# Patient Record
Sex: Female | Born: 1945 | Race: White | Hispanic: No | Marital: Married | Smoking: Former smoker
Health system: Southern US, Community
[De-identification: ages and names within clinical notes are randomized; demographics above are authoritative.]

## PROBLEM LIST (undated history)

## (undated) DIAGNOSIS — T8859XA Other complications of anesthesia, initial encounter: Secondary | ICD-10-CM

## (undated) DIAGNOSIS — M1990 Unspecified osteoarthritis, unspecified site: Secondary | ICD-10-CM

## (undated) DIAGNOSIS — M797 Fibromyalgia: Secondary | ICD-10-CM

## (undated) DIAGNOSIS — J181 Lobar pneumonia, unspecified organism: Secondary | ICD-10-CM

## (undated) DIAGNOSIS — I10 Essential (primary) hypertension: Secondary | ICD-10-CM

## (undated) DIAGNOSIS — I341 Nonrheumatic mitral (valve) prolapse: Secondary | ICD-10-CM

## (undated) DIAGNOSIS — Z85850 Personal history of malignant neoplasm of thyroid: Secondary | ICD-10-CM

## (undated) DIAGNOSIS — E039 Hypothyroidism, unspecified: Secondary | ICD-10-CM

## (undated) DIAGNOSIS — C3490 Malignant neoplasm of unspecified part of unspecified bronchus or lung: Secondary | ICD-10-CM

## (undated) DIAGNOSIS — F419 Anxiety disorder, unspecified: Secondary | ICD-10-CM

## (undated) DIAGNOSIS — E079 Disorder of thyroid, unspecified: Secondary | ICD-10-CM

## (undated) DIAGNOSIS — C801 Malignant (primary) neoplasm, unspecified: Secondary | ICD-10-CM

## (undated) DIAGNOSIS — Z9882 Breast implant status: Secondary | ICD-10-CM

## (undated) DIAGNOSIS — Z5112 Encounter for antineoplastic immunotherapy: Secondary | ICD-10-CM

## (undated) DIAGNOSIS — R0602 Shortness of breath: Secondary | ICD-10-CM

## (undated) DIAGNOSIS — T4145XA Adverse effect of unspecified anesthetic, initial encounter: Secondary | ICD-10-CM

## (undated) DIAGNOSIS — R51 Headache: Secondary | ICD-10-CM

## (undated) HISTORY — PX: CERVICAL CONE BIOPSY: SUR198

## (undated) HISTORY — DX: Breast implant status: Z98.82

## (undated) HISTORY — PX: PARS PLANA VITRECTOMY W/ REPAIR OF MACULAR HOLE: SHX2170

## (undated) HISTORY — DX: Lobar pneumonia, unspecified organism: J18.1

## (undated) HISTORY — PX: BREAST ENHANCEMENT SURGERY: SHX7

## (undated) HISTORY — DX: Disorder of thyroid, unspecified: E07.9

## (undated) HISTORY — PX: CATARACT EXTRACTION: SUR2

## (undated) HISTORY — DX: Personal history of malignant neoplasm of thyroid: Z85.850

## (undated) HISTORY — DX: Encounter for antineoplastic immunotherapy: Z51.12

## (undated) HISTORY — DX: Essential (primary) hypertension: I10

## (undated) HISTORY — PX: THYROIDECTOMY: SHX17

## (undated) HISTORY — PX: EYE SURGERY: SHX253

## (undated) HISTORY — PX: RETINAL DETACHMENT SURGERY: SHX105

## (undated) HISTORY — PX: TONSILLECTOMY: SUR1361

## (undated) HISTORY — DX: Headache: R51

## (undated) HISTORY — PX: BACK SURGERY: SHX140

---

## 2002-05-02 ENCOUNTER — Ambulatory Visit (HOSPITAL_COMMUNITY): Admission: RE | Admit: 2002-05-02 | Discharge: 2002-05-02 | Payer: Self-pay | Admitting: Internal Medicine

## 2002-05-02 ENCOUNTER — Encounter: Payer: Self-pay | Admitting: Internal Medicine

## 2003-03-15 ENCOUNTER — Encounter: Payer: Self-pay | Admitting: Ophthalmology

## 2003-03-15 ENCOUNTER — Ambulatory Visit (HOSPITAL_COMMUNITY): Admission: RE | Admit: 2003-03-15 | Discharge: 2003-03-16 | Payer: Self-pay | Admitting: Ophthalmology

## 2003-03-23 ENCOUNTER — Ambulatory Visit (HOSPITAL_COMMUNITY): Admission: RE | Admit: 2003-03-23 | Discharge: 2003-03-24 | Payer: Self-pay | Admitting: Ophthalmology

## 2003-12-27 ENCOUNTER — Encounter (HOSPITAL_COMMUNITY): Admission: RE | Admit: 2003-12-27 | Discharge: 2004-01-01 | Payer: Self-pay | Admitting: Internal Medicine

## 2004-01-10 ENCOUNTER — Ambulatory Visit (HOSPITAL_COMMUNITY): Admission: RE | Admit: 2004-01-10 | Discharge: 2004-01-11 | Payer: Self-pay | Admitting: Neurosurgery

## 2004-09-02 ENCOUNTER — Encounter (HOSPITAL_COMMUNITY): Admission: RE | Admit: 2004-09-02 | Discharge: 2004-09-03 | Payer: Self-pay | Admitting: Internal Medicine

## 2005-02-12 ENCOUNTER — Ambulatory Visit (HOSPITAL_COMMUNITY): Admission: RE | Admit: 2005-02-12 | Discharge: 2005-02-12 | Payer: Self-pay | Admitting: Internal Medicine

## 2005-05-08 ENCOUNTER — Ambulatory Visit (HOSPITAL_COMMUNITY): Admission: RE | Admit: 2005-05-08 | Discharge: 2005-05-08 | Payer: Self-pay | Admitting: Internal Medicine

## 2007-01-13 ENCOUNTER — Encounter: Admission: RE | Admit: 2007-01-13 | Discharge: 2007-01-13 | Payer: Self-pay | Admitting: Neurosurgery

## 2007-10-25 ENCOUNTER — Ambulatory Visit (HOSPITAL_COMMUNITY): Admission: RE | Admit: 2007-10-25 | Discharge: 2007-10-25 | Payer: Self-pay | Admitting: Internal Medicine

## 2008-07-24 ENCOUNTER — Ambulatory Visit (HOSPITAL_COMMUNITY): Admission: RE | Admit: 2008-07-24 | Discharge: 2008-07-25 | Payer: Self-pay | Admitting: Neurosurgery

## 2008-11-18 ENCOUNTER — Encounter: Admission: RE | Admit: 2008-11-18 | Discharge: 2008-11-18 | Payer: Self-pay | Admitting: Neurosurgery

## 2010-12-10 NOTE — Op Note (Signed)
NAME:  [name], [name]                  ACCOUNT NO.:  [id number]   MEDICAL RECORD NO.:  [id number]          PATIENT TYPE:  OIB   LOCATION:  3005                         FACILITY:  [facility]   PHYSICIAN:  Cristi Loron, M.D.DATE OF BIRTH:  Jan 16, 1946   DATE OF PROCEDURE:  07/24/2008  DATE OF DISCHARGE:                               OPERATIVE REPORT   BRIEF HISTORY:  The patient is a 65 year old white female who has  suffered from back and right leg pain consistent with a right L4 and L5  radiculopathy.  She failed extensive nonsurgical management and was  worked up with lumbar MRI, which demonstrated the patient had a synovial  cyst and severe facet arthropathy at L4-5 on the right.  I discussed the  various treatment options with the patient including surgery.  The  patient has weighed the risks, benefits, and alternatives of surgery and  decided to proceed with a right L4 hemilaminectomy to remove synovial  cyst and decompress the right L4 as well as L5 nerve root.   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   PROCEDURE:  Right L4 redo hemilaminectomy to decompress the right L4 as  well as L5 nerve root using microdissection and removal of right L4-5  synovial cyst.   SURGEON:  Cristi Loron, MD   ASSISTANT:  Coletta Memos, MD   ANESTHESIA:  General endotracheal.   ESTIMATED BLOOD LOSS:  100 mL.   SPECIMENS:  None.   DRAINS:  None.   COMPLICATIONS:  Unintentional durotomy.   DESCRIPTION OF PROCEDURE:  The patient was brought to the operating room  by the anesthesia team.  General endotracheal anesthesia was induced.  The patient was turned to the prone position on the Wilson frame.  Her  lumbosacral region was  then prepared with Betadine scrub and Betadine  solution.  Sterile drapes were applied.  I then injected the area to be  incised with Marcaine with epinephrine solution.  I used a scalpel to  make an incision through the patient's previous surgical scar.  I used  electrocautery to perform a right-sided subperiosteal dissection  exposing the spinous processes and lamina of right L4 and L5.  We  obtained intraoperative radiograph to confirm our location.  We brought  intraoperative microscope into the field, and under image magnification  and illumination, we completed the microdissection/decompression.  As we  drilled to perform a left L4 laminotomy, there was a quite a bit of scar  tissue at L4-5 because of the patient's prior surgery. We encountered  quite a bit of scar tissue from L4-5 because of the patient's prior  surgery.  We carefully drilled the caudal edge of the right L4 lamina  until we encountered a relatively nonscarred down dura.  We used  microdissection to free up the dura from the epidural fibrosis and  removed the scar tissue using Kerrison punches.  We  performed a  foraminotomy about the left L4 as well as L5 nerve root.  We encountered  a quite bit of facet arthropathy as well as large synovial cyst, removed  it in multiple fragments using the pituitary forceps.  We performed wide  foraminotomies about the right L4 as well as L5 nerve root completing  the decompression.  In a process of doing this, we did create a small  unintentional durotomy a few millimeters cephalad and more or less  ventrolaterally on a thecal sac above the right L5 nerve root.  We were  able to repair this with a single 6-0 Prolene suture.  Then, Anesthesia  Valsalva the patient and did not notice any CSF leak.  At this point, we  palpated along the ventral surface of thecal sac along the exit route of  the L4 and L5 nerve root and noted it well decompressed.  We inspected  the intervertebral  disk, it was bulging, but there was no significant  herniations.  We then obtained hemostasis using bipolar electrocautery.  We irrigated the wound out with bacitracin solution.  We then laid  Tisseel over the exposed dura and removed the retractor, and we then  reapproximated the patient's thoracolumbar fascia with interrupted #1  Vicryl suture, subcutaneous tissue with interrupted 2-0 Vicryl suture,  and the skin with a running 3-0 Prolene suture.  The wound was then  coated with bacitracin ointment and sterile dressing.  Drapes were  removed, and the patient was subsequently returned to supine position  where she was extubated by the anesthesia team and transported to the  postanesthesia care unit in stable condition.  All sponge, instrument,  and needle counts were correct at the end of this case.      Cristi Loron, M.D.  Electronically Signed     JDJ/MEDQ  D:  07/24/2008  T:  07/25/2008  Job:  [id number]

## 2010-12-13 NOTE — Procedures (Signed)
NAME:  REBBDonelle, Hise               ACCOUNT NO.:  [id number]   MEDICAL RECORD NO.:  [id number]         PATIENT TYPE:  REC   LOCATION:  RAD                           FACILITY:  [facility]   PHYSICIAN:  Kingsley Callander. Ouida Sills, MD       DATE OF BIRTH:  08-Jul-1946   DATE OF PROCEDURE:  DATE OF DISCHARGE:                                    STRESS TEST   PROCEDURE:  The patient exercised seven minutes 37 seconds (one minute 37  seconds on the Stage 3 of the Bruce protocol) obtaining a maximal heart rate  of 160 (98% on the age-predicted maximal heart rate) at a work load of 10.1  mets and discontinued exercise due to fatigue.  There were no symptoms of  chest pain.  There were no Arrythmias.  There were no ST segment changes  diagnostic of ischemia.   IMPRESSION:  No evidence of exercise-induced ischemia.  Myoview images  pending.      ROF/MEDQ  D:  09/02/2004  T:  09/02/2004  Job:  [id number]

## 2010-12-13 NOTE — Op Note (Signed)
NAME:  [name], [name]                            ACCOUNT NO.:  [id number]   MEDICAL RECORD NO.:  [id number]                   PATIENT TYPE:  OIB   LOCATION:  5006                                 FACILITY:  [facility]   PHYSICIAN:  Cristi Loron, M.D.            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   PROCEDURES:  1. Right L3-4 microdiskectomy.  2. Right L4 laminotomy to treat the spinal stenosis at L4-5.  3. Using microdissection.   SURGEON:  Cristi Loron, M.D.   ASSISTANT:  Hewitt Shorts, M.D.   ANESTHESIA:  General endotracheal.   ESTIMATED BLOOD LOSS:  50 mL.   SPECIMENS:  None.   DRAINS:  None.   COMPLICATIONS:  None.   BRIEF HISTORY:  The patient is a 65 year old white female who has suffered  from severe back and right leg pain.  She failed medical management and was  worked up with a lumbar MRI, which demonstrated a herniated disk at L3-4 on  the right as well as lateral recess stenosis at L4-5 on the right.  I  discussed the various treatment options with the patient, including surgery.  The patient has weighed the risks, benefits, and alternatives of surgery and  decided to proceed with a right L3-4 microdiskectomy and a right L4  laminotomy to treat the spinal stenosis at L4-5.   DESCRIPTION OF PROCEDURE:  The patient was brought to the operating room by  the anesthesia team.  General endotracheal anesthesia was induced.  The  patient was then turned to the prone position on the Wilson frame.  Her  lumbosacral region was then prepared with Betadine scrub and  Betadine  solution.  Sterile drapes were applied.  I then injected the area to be  incised with Marcaine with epinephrine solution.  I used a scalpel to make a  linear midline incision over the L3-4 and L4-5 interspace.  I used  electrocautery to perform a right-sided subperiosteal dissection, exposing  the right spinous processes and laminae of L3, L4, and L5. We inserted the  McCullough retractor for exposure and then obtained an intraoperative  radiograph to confirm our location.   We brought the operating microscope into the field and under its  magnification and illumination, we completed the  microdissection/decompression.  We used the high-speed drill to perform a  right L3 and L4 laminotomy.  We completed the L4 laminectomy using Kerrison  punch and similarly removed the L4-5 and L3-4 ligamentum flavum with a  Kerrison punch, and we widened the L3 laminotomy with the Kerrison punch.  We used microdissection to free up the thecal sac from the epidural tissue,  and we decompressed the lateral recesses using the Kerrison punches.  We  performed a foraminotomy about the right L4 and L5 nerve root.  We then  inspected the L4-5 intervertebral disk.  It was bulging minimally, but there  was no neural compression.  We then inspected the L3-4 intervertebral disk,  and there was a moderate-size disk herniation, which was compressing the L4  nerve root.  Dr. Newell Coral carefully retracted the thecal sac medially, and  this exposed the herniated disk.  I incised the herniated disk with a 15  blade scalpel.  I performed a partial intervertebral diskectomy by using the  pituitary forceps and the Epstein and Scoville curettes.  When we were  satisfied with the diskectomy, we used the osteophyte tool to remove some  spondylosis from the vertebral end plates at Z6-1.  We then palpated along  the ventral surface of the thecal sac and along the exit route of the right  L4 and L5 nerve root and noted  that the neural structures were well-  decompressed.  We then obtained stringent hemostasis using bipolar  electrocautery.  We copiously irrigated the wound out with bacitracin  solution and removed the solution and then removed the [facility] retractor.  We reapproximated the patient's thoracolumbar fascia with interrupted #1  Vicryl suture, the subcutaneous tissue with interrupted 2-0 Vicryl suture,  and the skin with Steri-Strips with Benzoin.  The wound was then coated with  bacitracin ointment and a sterile dressing applied.  The drapes were  removed.  The patient was subsequently returned to a supine position, where  she was extubated by the anesthesia team and transported to the  postanesthesia care unit in stable condition.  All sponge, instrument, and  needle counts were correct at the end of this case.                                               Cristi Loron, M.D.    JDJ/MEDQ  D:  01/10/2004  T:  01/11/2004  Job:  [id number]

## 2010-12-13 NOTE — Op Note (Signed)
NAME:  [name], [name]                            ACCOUNT NO.:  [id number]   MEDICAL RECORD NO.:  [id number]                   PATIENT TYPE:  OIB   LOCATION:  5705                                 FACILITY:  [facility]   PHYSICIAN:  Beulah Gandy. Ashley Royalty, M.D.              DATE OF BIRTH:  March 28, 1946   DATE OF PROCEDURE:  03/23/2003  DATE OF DISCHARGE:  03/24/2003                                 OPERATIVE REPORT   ADMISSION DIAGNOSIS:  Recurrent retinal detachment, right eye.   PROCEDURE:  Scleral buckle #2 with pars plana vitrectomy, Perfluoron  injection, Perfluoron removal, perfluoropropane injection in the right eye.   SURGEON:  Rudy Jew. Ashley Royalty, M.D.   ASSISTANT:  Merian Capron, M.A.   ANESTHESIA:  General.   DETAILS:  The usual prep and drape.  A 360 degree limbal peritomy.  Isolation of four rectus muscles on 2-0 silk.  Localization of breaks in the  lower nasal quadrant.  The previous buckle was opened between 3 and 6  o'clock.  Additional posterior dissection was carried out along with  diathermy in this region.  A 508G radial piece was placed against the globe  beneath the 279 implant.  A perforation site was chosen at 6 o'clock and a  small amount of clear, colorless subretinal fluid came forth.  The attention  was carried to the pars plana region, where a 6 mm infusion port was  anchored into place at 8 o'clock.  The lighted pick and the cutter were  placed at 10 and 2 o'clock, respectively.  Provisc was placed on the corneal  surface.  The BIOM was moved into place and the pars plana vitrectomy was  begun with the lighted pick and the cutter at 10 and 2 o'clock.  A large  quantity of inflamed vitreous was encountered and carefully removed under  low suction and rapid cutting.  Areas of clumps and pigment were encountered  with strands attached to the retina in multiple locations.  The retina was  extremely loose and difficult to separate from the vitreous.  Once all of  the  vitreous was separated for 360 degrees and was removed, Perfluoron was  injected to reattach the retina.  The fluid egressed through the tiny breaks  and the retina reattached.  The Endolaser was then moved into place and 756  burns were placed on the scleral buckle with a power of between 600 and 800  watts, 1000 microns each, and 0.1 second each.  The Perfluoron was then  removed and sterile air was injected to take the place of the Perfluoron.  Perfluoropropane, C3F8, was then mixed in a 14% concentration.  This was  exchanged for the intravitreal gas.  The instruments were removed from the  eye and 9-0 nylon was used to close the sclerotomy sites.  They were tested  and found to be tight with a Weck-Cel sponge.  The scleral buckle was closed  with two interrupted 4-0 Mersilene sutures in the lower nasal quadrant.  The  conjunctiva was reposited with 7-0 chromic suture.  Polymyxin and gentamicin  were irrigated into Tenon's space.  Atropine solution was applied.  Marcaine  was injected around the globe for postop pain.  The closing tension was 10  with a Barraquer tonometer.  Polysporin, a patch and shield were placed.  The patient was awakened and taken to recovery in satisfactory condition.    DURATION:  Two hours.   COMPLICATIONS:  None.                                               Beulah Gandy. Ashley Royalty, M.D.    JDM/MEDQ  D:  03/23/2003  T:  03/24/2003  Job:  [id number]

## 2010-12-13 NOTE — Op Note (Signed)
NAME:  [name], [name]                            ACCOUNT NO.:  [id number]   MEDICAL RECORD NO.:  [id number]                   PATIENT TYPE:  OIB   LOCATION:  2550                                 FACILITY:  [facility]   PHYSICIAN:  Beulah Gandy. Ashley Royalty, M.D.              DATE OF BIRTH:  1946-05-15   DATE OF PROCEDURE:  03/15/2003  DATE OF DISCHARGE:                                 OPERATIVE REPORT   PREOPERATIVE DIAGNOSIS:  Rhegmatogenous retinal detachment of the right eye.   POSTOPERATIVE DIAGNOSIS:  Rhegmatogenous retinal detachment of the right  eye.   PROCEDURE:  Scleral buckle right eye, retinal photocoagulation, right eye.  Gas-fluid exchange right eye.   SURGEON:  Beulah Gandy. Ashley Royalty, M.D.   ASSISTANT:  Bryan Lemma. Lundquist, P.A.   ANESTHESIA:  General.   DESCRIPTION OF PROCEDURE:  Usual prep and drape. A 360 degree limbal  peritomy. Isolation of 4 rectus muscles on 2-0 silk. Localization of break  at 11:30 o'clock. Scleral dissection for 360 degrees, 11 mm in width to  admit a #279 intrascleral implant. Diathermy placed in the bed.   A 279 implant was placed around the eye and trimmed at 2 o'clock to fit  itself. A 240 band was placed around the eye with a 270 sleeve at 2 o'clock.  Two sutures per quadrant were placed in the scleral flaps for a total of 8  sutures. A 508G radial segment was placed at 11:30 o'clock beneath the beak.  Perforation site at 10 o'clock. A large amount of clear, colorless  subretinal fluid came fourth in a slowly controlled manner. Then 1.5 mL of  30% perfluoropropane was injected into the vitreous cavity to reinflate the  globe.   Indirect ophthalmoscopy showed the retina to be lying nicely in place on the  scleral buckle. The indirect ophthalmoscope laser was moved into place. Then  362 burns were placed on the scleral buckle around the temporal side. The  power was between 400 and 600 milliwatts, 1000 microns each and 0.1 to 0.2  seconds each. The  buckle was trimmed. The band was adjusted and trimmed. The  sutures were knotted and trimmed.   The conjunctiva was reposited  with 7-0 chromic suture. Polymixin and  gentamicin were irrigated into tenon space. Atropine solution was applied.  Decadron 10 mg was injected  into the lower subconjunctival space. Marcaine was injected around the globe  for postoperative pain. The closing tension was 10 with the Behr  keratometer. Polysporin, a patch and shield were placed.   The patient was awakened and taken to the recovery room in satisfactory  condition. Duration was 2 hours. There were no complications.  Beulah Gandy. Ashley Royalty, M.D.    JDM/MEDQ  D:  03/15/2003  T:  03/16/2003  Job:  [id number]

## 2011-05-02 LAB — CBC
MCHC: 33.8 g/dL (ref 30.0–36.0)
RDW: 13.5 % (ref 11.5–15.5)
WBC: 10.1 10*3/uL (ref 4.0–10.5)

## 2011-05-02 LAB — BASIC METABOLIC PANEL
BUN: 14 mg/dL (ref 6–23)
CO2: 24 mEq/L (ref 19–32)
Chloride: 105 mEq/L (ref 96–112)
Creatinine, Ser: 0.61 mg/dL (ref 0.4–1.2)
GFR calc Af Amer: >60 mL/min (ref >60)
Glucose, Bld: 97 mg/dL (ref 70–99)
Potassium: 3.9 mEq/L (ref 3.5–5.1)

## 2014-03-21 ENCOUNTER — Ambulatory Visit (HOSPITAL_COMMUNITY)
Admission: RE | Admit: 2014-03-21 | Discharge: 2014-03-21 | Disposition: A | Discharge status: Home / Self Care | Payer: Medicare Other | Source: Ambulatory Visit | Attending: Internal Medicine | Admitting: Internal Medicine

## 2014-03-21 ENCOUNTER — Other Ambulatory Visit (HOSPITAL_COMMUNITY): Payer: Self-pay | Admitting: Internal Medicine

## 2014-03-21 DIAGNOSIS — R059 Cough, unspecified: Secondary | ICD-10-CM

## 2014-03-21 DIAGNOSIS — J181 Lobar pneumonia, unspecified organism: Secondary | ICD-10-CM

## 2014-03-21 DIAGNOSIS — R05 Cough: Secondary | ICD-10-CM | POA: Diagnosis present

## 2014-03-21 DIAGNOSIS — J9819 Other pulmonary collapse: Secondary | ICD-10-CM | POA: Insufficient documentation

## 2014-03-21 HISTORY — DX: Lobar pneumonia, unspecified organism: J18.1

## 2014-03-22 ENCOUNTER — Other Ambulatory Visit (HOSPITAL_COMMUNITY): Payer: Self-pay | Admitting: Internal Medicine

## 2014-03-22 DIAGNOSIS — R9389 Abnormal findings on diagnostic imaging of other specified body structures: Secondary | ICD-10-CM

## 2014-03-23 ENCOUNTER — Ambulatory Visit (HOSPITAL_COMMUNITY)
Admission: RE | Admit: 2014-03-23 | Discharge: 2014-03-23 | Disposition: A | Discharge status: Home / Self Care | Payer: Medicare Other | Source: Ambulatory Visit | Attending: Internal Medicine | Admitting: Internal Medicine

## 2014-03-23 DIAGNOSIS — R9389 Abnormal findings on diagnostic imaging of other specified body structures: Secondary | ICD-10-CM

## 2014-03-23 DIAGNOSIS — R059 Cough, unspecified: Secondary | ICD-10-CM | POA: Insufficient documentation

## 2014-03-23 DIAGNOSIS — R05 Cough: Secondary | ICD-10-CM | POA: Diagnosis present

## 2014-03-23 DIAGNOSIS — R079 Chest pain, unspecified: Secondary | ICD-10-CM | POA: Insufficient documentation

## 2014-03-23 DIAGNOSIS — Z85850 Personal history of malignant neoplasm of thyroid: Secondary | ICD-10-CM | POA: Insufficient documentation

## 2014-03-23 DIAGNOSIS — R918 Other nonspecific abnormal finding of lung field: Secondary | ICD-10-CM | POA: Diagnosis not present

## 2014-03-23 DIAGNOSIS — M25519 Pain in unspecified shoulder: Secondary | ICD-10-CM | POA: Insufficient documentation

## 2014-03-23 MED ORDER — IOHEXOL 300 MG/ML  SOLN
80.0000 mL | Freq: Once | INTRAMUSCULAR | Status: AC | PRN
  Administered 2014-03-23: 80 mL via INTRAVENOUS

## 2014-03-24 ENCOUNTER — Encounter: Payer: Self-pay | Admitting: *Deleted

## 2014-03-24 DIAGNOSIS — E785 Hyperlipidemia, unspecified: Secondary | ICD-10-CM | POA: Insufficient documentation

## 2014-03-24 DIAGNOSIS — Z85850 Personal history of malignant neoplasm of thyroid: Secondary | ICD-10-CM | POA: Insufficient documentation

## 2014-03-24 DIAGNOSIS — Z9882 Breast implant status: Secondary | ICD-10-CM | POA: Insufficient documentation

## 2014-03-24 DIAGNOSIS — I10 Essential (primary) hypertension: Secondary | ICD-10-CM | POA: Insufficient documentation

## 2014-03-28 ENCOUNTER — Encounter: Payer: Self-pay | Admitting: Thoracic Surgery (Cardiothoracic Vascular Surgery)

## 2014-03-28 ENCOUNTER — Other Ambulatory Visit: Payer: Self-pay

## 2014-03-28 ENCOUNTER — Institutional Professional Consult (permissible substitution) (INDEPENDENT_AMBULATORY_CARE_PROVIDER_SITE_OTHER): Payer: Medicare Other | Admitting: Thoracic Surgery (Cardiothoracic Vascular Surgery)

## 2014-03-28 VITALS — BP 150/85 | HR 102 | Resp 16 | Ht 68.5 in | Wt 144.0 lb

## 2014-03-28 DIAGNOSIS — J181 Lobar pneumonia, unspecified organism: Secondary | ICD-10-CM

## 2014-03-28 DIAGNOSIS — J13 Pneumonia due to Streptococcus pneumoniae: Secondary | ICD-10-CM

## 2014-03-28 NOTE — Progress Notes (Signed)
PCP is Asencion Noble, MD Referring Provider is Asencion Noble, MD  Chief Complaint  Patient presents with  . Referral    for RLLCONSOLIDATION per CT CHEST 03/23/14    HPI: 68 year old woman sent for consultation regarding right lower lobe consolidation.  [name] is a 68 year old woman with a long history of heavy tobacco abuse who has had a persistent cough for the last 3-4 months. She says that she's been under a lot of stress for the past 4-5 months. Little over 3 months ago she noted a cough. She often has had a cough over the years with smoking and has had multiple episodes of bronchitis. Her cough was initially nonproductive, but became productive over time. She is a clear sputum. She has not had any blood in the sputum. She does have some chest discomfort with coughing. She also is having a lot of wheezing lately. She had a chest x-ray which showed a right lower lobe infiltrate. This was followed by a CT of the chest which showed consolidation of the right lower lobe and a possible mass with extrinsic obstruction of the bronchus. She was treated with Levaquin and finishes that tomorrow.    She has lost 9 pounds over the past 6 months and 4 pounds over the past 3 months. She has not had any fevers. She had one episode of chills 2 weeks ago. She has had severe night sweats. She has not had any anginal chest pain. She does complain of loss of appetite and decreased energy.  ECOG/ZUBROD= 0   Past Medical History  Diagnosis Date  . Hypertension   . Hyperlipidemia   . Thyroid disease     hypothyroidism  . Hx of thyroid cancer   . Status post bilateral breast implants   . Consolidation lung 03/21/14    RLL PER CXR/CT    Past Surgical History  Procedure Laterality Date  . Thyroidectomy    . Cataract extraction Right   . Breast enhancement surgery    . Retinal detachment surgery      Family History  Problem Relation Age of Onset  . Early death Mother   . Heart disease Mother   .  Early death Father   . Heart disease Father     MI  . Cancer Sister     BREAST  . Heart disease Sister     MI 2013    Social History History  Substance Use Topics  . Smoking status: Current Every Day Smoker -- 1.50 packs/day for 49 years    Types: Cigarettes  . Smokeless tobacco: Never Used  . Alcohol Use: Yes     Comment: SOCIAL    Current Outpatient Prescriptions  Medication Sig Dispense Refill  . aspirin EC 81 MG tablet Take 81 mg by mouth daily.      Marland Kitchen estradiol (ESTRACE) 0.5 MG tablet Take 0.5 mg by mouth daily.      Marland Kitchen levofloxacin (LEVAQUIN) 750 MG tablet Take 750 mg by mouth daily. X 7 days      . levothyroxine (SYNTHROID, LEVOTHROID) 125 MCG tablet Take 125 mcg by mouth daily before breakfast. TAKE 2 TABLETS EVERY MORNING BEFORE BREAKFAST...223mcq      . medroxyPROGESTERone (PROVERA) 2.5 MG tablet Take 2.5 mg by mouth daily.       No current facility-administered medications for this visit.    Allergies  Allergen Reactions  . Niacin And Related Rash    Review of Systems  Constitutional: Positive for chills (one episode), diaphoresis,  activity change, appetite change, fatigue and unexpected weight change. Negative for fever.  Respiratory: Positive for cough, shortness of breath and wheezing.   Cardiovascular: Positive for chest pain (Pleuritic).  Gastrointestinal: Positive for nausea (secondary to Levaquin) and vomiting (Secondary to Levaquin).  Musculoskeletal: Positive for arthralgias and joint swelling.  Hematological: Negative for adenopathy. Does not bruise/bleed easily.  Psychiatric/Behavioral: Positive for sleep disturbance and dysphoric mood. The patient is nervous/anxious.   All other systems reviewed and are negative.   BP 150/85  Pulse 102  Resp 16  Ht 5' 8.5" (1.74 m)  Wt 144 lb (65.318 kg)  BMI 21.57 kg/m2  SpO2 96% Physical Exam  Vitals reviewed. Constitutional: She is oriented to person, place, and time. She appears well-developed and  well-nourished.  HENT:  Head: Normocephalic and atraumatic.  Eyes: EOM are normal. Pupils are equal, round, and reactive to light.  Neck: Normal range of motion. No thyromegaly present.  Cardiovascular: Normal rate and regular rhythm.   Murmur (Faint systolic) heard. Pulmonary/Chest: Effort normal. She has no wheezes.  Diminished breath sounds right base  Abdominal: Soft. There is no tenderness.  Musculoskeletal: She exhibits no edema.  Lymphadenopathy:    She has no cervical adenopathy.  Neurological: She is alert and oriented to person, place, and time. No cranial nerve deficit.  Skin: Skin is warm and dry.     Diagnostic Tests: CT CHEST WITH CONTRAST  TECHNIQUE:  Multidetector CT imaging of the chest was performed during  intravenous contrast administration.  CONTRAST: 44mL OMNIPAQUE IOHEXOL 300 MG/ML SOLN  COMPARISON: Chest radiograph 10/19/2013 and 10/25/2007  FINDINGS:  The left lung is clear. There are no pleural or pericardial  effusions. Thyroid gland not identified. Thoracic inlet normal.  Bilateral breast implants identified. No acute musculoskeletal  findings ; osseous thorax is intact.  Left lung is clear except for tiny focus of scarring or peripheral  atelectasis left apex. On the right, there is extensive lower lobe  consolidation. There is densely consolidated lung in the central to  lateral aspect of the basilar segments of the lower lobe. Medially  there is some aeration with multiple rounded foci of ill-defined  opacity measuring around 1-1.5 cm each.  Contrast opacification is suboptimal. There does appear to be excess  soft tissue in the right hilum. In the mid to inferior right hilum  there is subtle low-attenuation soft tissue. There is a suggestion  of a mass in the inferior right hilum measuring 18 x 18 x 12 mm.  This could represent adenopathy but it may also represent  consolidated lung. Bronchogenic carcinoma is another possibility.  There does  appear to be mass effect on the branches of the airway  supplying the right base. There is evidence of extrinsic mass effect  on lower lobe bronchi, with no endobronchial lesions identified.  There is a pretracheal lymph node measuring 8 mm. There is an 8 mm  precarinal lymph node.  Images through the upper abdomen partially visualized the adrenal  glands and there is a left adrenal nodule measuring at least 14 mm.  IMPRESSION:  1. Extensive right lower lobe consolidation. Densely consolidated  right lower lobe with rounded areas of opacity medially, most  consistent with pneumonia or postobstructive pneumonitis.  Radiographic followup after appropriate therapy with followup CT  scan should be performed to ensure resolution, particularly of the  more nodular foci of opacity to ensure that these do not represent  mass.  2. Soft tissue in the right hilum, which  may represent a combination  of at adenopathy and consolidated lung the, but there is a masslike  focus in the inferior right hilum seen best on axial images 31  through 35. There is evidence of extrinsic mass effect on lower lobe  bronchi which may be causing airway obstruction. Bronchogenic  carcinoma not excluded.  3. Borderline mediastinal lymph nodes  4. Partially visualized left adrenal nodule measuring at least 14  mm. CT abdomen pelvis, or adrenal protocol MRI, could be considered  to further evaluate for the possibility of a significant left  adrenal lesion.  Electronically Signed  By: Skipper Cliche M.D.  On: 03/24/2014 08:37         Impression: 68 year old woman with a history of heavy tobacco abuse who presents with a 3+ month history of coughing. A chest x-ray and then CT scan showed dense consolidation right lower lobe. A CT scan shows a suggestion of a mass which could be causing obstruction of the right lower lobe bronchus. Given her smoking history and the CT findings I think bronchoscopy is indicated to  further evaluate. She also has some mildly enlarged mediastinal and right hilar lymph nodes. This may all be reactive, but I think an endobronchial ultrasound to evaluate those nodes would also be appropriate.  I reviewed the films and discussed the differential diagnosis with Mr. and [name].  I discussed the proposed operative procedure which is bronchoscopy and endobronchial ultrasound under general anesthesia with them. We discussed the indications, risks, benefits, and alternatives. I informed him of the general nature of the procedure and the reasoning behind using general anesthesia. They do understand this as an outpatient procedure. She understands the risks include, but are not limited to death, MI, stroke, bleeding, pneumothorax, failure to make a diagnosis, as well as the possibility of unforeseeable complications. She accepts the risks and agrees to proceed.  Plan: She will complete her antibiotics as prescribed.  Bronchoscopy and endobronchial ultrasound on Friday, 04/07/2014.

## 2014-04-04 ENCOUNTER — Encounter (HOSPITAL_COMMUNITY): Payer: Self-pay | Admitting: Pharmacy Technician

## 2014-04-05 ENCOUNTER — Encounter (HOSPITAL_COMMUNITY)
Admission: RE | Admit: 2014-04-05 | Discharge: 2014-04-05 | Disposition: A | Discharge status: Home / Self Care | Payer: Medicare Other | Source: Ambulatory Visit | Attending: Thoracic Surgery (Cardiothoracic Vascular Surgery) | Admitting: Thoracic Surgery (Cardiothoracic Vascular Surgery)

## 2014-04-05 ENCOUNTER — Encounter (HOSPITAL_COMMUNITY): Payer: Self-pay

## 2014-04-05 VITALS — BP 175/95 | HR 108 | Temp 98.0°F | Resp 16 | Ht 68.5 in | Wt 144.0 lb

## 2014-04-05 DIAGNOSIS — F17200 Nicotine dependence, unspecified, uncomplicated: Secondary | ICD-10-CM | POA: Diagnosis not present

## 2014-04-05 DIAGNOSIS — F411 Generalized anxiety disorder: Secondary | ICD-10-CM | POA: Diagnosis not present

## 2014-04-05 DIAGNOSIS — R918 Other nonspecific abnormal finding of lung field: Secondary | ICD-10-CM | POA: Diagnosis present

## 2014-04-05 DIAGNOSIS — I10 Essential (primary) hypertension: Secondary | ICD-10-CM | POA: Diagnosis not present

## 2014-04-05 DIAGNOSIS — E039 Hypothyroidism, unspecified: Secondary | ICD-10-CM | POA: Diagnosis not present

## 2014-04-05 DIAGNOSIS — J181 Lobar pneumonia, unspecified organism: Secondary | ICD-10-CM

## 2014-04-05 DIAGNOSIS — R011 Cardiac murmur, unspecified: Secondary | ICD-10-CM | POA: Diagnosis not present

## 2014-04-05 DIAGNOSIS — M1990 Unspecified osteoarthritis, unspecified site: Secondary | ICD-10-CM | POA: Diagnosis not present

## 2014-04-05 DIAGNOSIS — C3410 Malignant neoplasm of upper lobe, unspecified bronchus or lung: Secondary | ICD-10-CM | POA: Diagnosis not present

## 2014-04-05 DIAGNOSIS — Z85850 Personal history of malignant neoplasm of thyroid: Secondary | ICD-10-CM | POA: Diagnosis not present

## 2014-04-05 DIAGNOSIS — C3430 Malignant neoplasm of lower lobe, unspecified bronchus or lung: Secondary | ICD-10-CM | POA: Diagnosis not present

## 2014-04-05 DIAGNOSIS — E785 Hyperlipidemia, unspecified: Secondary | ICD-10-CM | POA: Diagnosis not present

## 2014-04-05 DIAGNOSIS — IMO0001 Reserved for inherently not codable concepts without codable children: Secondary | ICD-10-CM | POA: Diagnosis not present

## 2014-04-05 HISTORY — DX: Nonrheumatic mitral (valve) prolapse: I34.1

## 2014-04-05 HISTORY — DX: Other complications of anesthesia, initial encounter: T88.59XA

## 2014-04-05 HISTORY — DX: Hypothyroidism, unspecified: E03.9

## 2014-04-05 HISTORY — DX: Shortness of breath: R06.02

## 2014-04-05 HISTORY — DX: Unspecified osteoarthritis, unspecified site: M19.90

## 2014-04-05 HISTORY — DX: Adverse effect of unspecified anesthetic, initial encounter: T41.45XA

## 2014-04-05 HISTORY — DX: Anxiety disorder, unspecified: F41.9

## 2014-04-05 HISTORY — DX: Fibromyalgia: M79.7

## 2014-04-05 HISTORY — DX: Malignant (primary) neoplasm, unspecified: C80.1

## 2014-04-05 LAB — COMPREHENSIVE METABOLIC PANEL
ALBUMIN: 3.6 g/dL (ref 3.5–5.2)
ALK PHOS: 197 U/L — AB (ref 39–117)
ALT: 33 U/L (ref 0–35)
AST: 18 U/L (ref 0–37)
Anion gap: 17 — ABNORMAL HIGH (ref 5–15)
BUN: 12 mg/dL (ref 6–23)
CALCIUM: 9.8 mg/dL (ref 8.4–10.5)
CO2: 20 mEq/L (ref 19–32)
Chloride: 104 mEq/L (ref 96–112)
Creatinine, Ser: 0.46 mg/dL — ABNORMAL LOW (ref 0.50–1.10)
GFR calc Af Amer: >90 mL/min (ref >90)
GFR calc non Af Amer: >90 mL/min (ref >90)
Glucose, Bld: 95 mg/dL (ref 70–99)
POTASSIUM: 4.5 meq/L (ref 3.7–5.3)
SODIUM: 141 meq/L (ref 137–147)
TOTAL PROTEIN: 8.2 g/dL (ref 6.0–8.3)
Total Bilirubin: 0.3 mg/dL (ref 0.3–1.2)

## 2014-04-05 LAB — CBC
HCT: 37.4 % (ref 36.0–46.0)
Hemoglobin: 12.5 g/dL (ref 12.0–15.0)
MCH: 29.7 pg (ref 26.0–34.0)
MCHC: 33.4 g/dL (ref 30.0–36.0)
MCV: 88.8 fL (ref 78.0–100.0)
PLATELETS: 529 10*3/uL — AB (ref 150–400)
RBC: 4.21 MIL/uL (ref 3.87–5.11)
RDW: 13.6 % (ref 11.5–15.5)
WBC: 10.9 10*3/uL — ABNORMAL HIGH (ref 4.0–10.5)

## 2014-04-05 LAB — PROTIME-INR
INR: 1.06 (ref 0.00–1.49)
Prothrombin Time: 13.8 seconds (ref 11.6–15.2)

## 2014-04-05 LAB — APTT: APTT: 39 s — AB (ref 24–37)

## 2014-04-05 NOTE — Pre-Procedure Instructions (Addendum)
Nahla Longie  04/05/2014   Your procedure is scheduled on:  04-07-2014    Friday   Report to [facility] Admitting at 5:30AM.   Call this number if you have problems the morning of surgery: [phone]   Remember:   Do not eat food or drink liquids after midnight.   Take these medicines the morning of surgery with A SIP OF WATER: clonazepam(Klonopin) if needed,estradiol(Estrace),levothyroxine(synthroid)Provera   Do not wear jewelry, make-up or nail polish.  Do not wear lotions, powders, or perfumes. You may not wear deodorant.  Do not shave 48 hours prior to surgery.   Do not bring valuables to the hospital.  [facility] is not responsible for any belongings or valuables.               Contacts, dentures or bridgework may not be worn into surgery    .               Patients discharged the day of surgery will not be allowed to drive home.    Name and phone number of your driver:     Special Instructions: See attached sheet for instructions on CHG shower/bath   Please read over the following fact sheets that you were given: Pain Booklet, Coughing and Deep Breathing and Surgical Site Infection Prevention

## 2014-04-06 NOTE — Progress Notes (Signed)
Anesthesia Chart Review:  Patient is a 68 year old female scheduled for bronchoscopy with endobronchial ultrasound on 04/07/14 by Dr. Roxan Hockey.  History includes right lower lobe lung consolidation (with question of underlying mass) 02/2014, exertional dyspnea, thyroid cancer status post thyroidectomy with secondary hypothyroidism, smoking, hypertension (recently diagnosed but not yet on medication), MVP, anxiety, arthritis, fibromyalgia, breast augmentation, back surgery, tonsillectomy.   EKG on 04/05/14 showed NSR. Last stress test in 2006.  CXR on 04/05/14 showed: No significant change in right lower lobe pneumonia.  Chest CT with contrast on 03/23/14 showed: 1. Extensive right lower lobe consolidation. Densely consolidated right lower lobe with rounded areas of opacity medially, most consistent with pneumonia or postobstructive pneumonitis. Radiographic followup after appropriate therapy with followup CT scan should be performed to ensure resolution, particularly of the more nodular foci of opacity to ensure that these do not represent mass.  2. Soft tissue in the right hilum, which may represent a combination of at adenopathy and consolidated lung the, but there is a masslike focus in the inferior right hilum seen best on axial images 31 through 35. There is evidence of extrinsic mass effect on lower lobe bronchi which may be causing airway obstruction. Bronchogenic carcinoma not excluded.  3. Borderline mediastinal lymph nodes  4. Partially visualized left adrenal nodule measuring at least 14 mm. CT abdomen pelvis, or adrenal protocol MRI, could be considered to further evaluate for the possibility of a significant left adrenal lesion.  Preoperative labs noted.    If no acute changes then I anticipate that she can proceed as planned.  [facility] Stay [facility]/Anesthesiology Phone [phone] 04/06/2014 9:23 AM

## 2014-04-07 ENCOUNTER — Ambulatory Visit (HOSPITAL_COMMUNITY)
Admission: RE | Admit: 2014-04-07 | Discharge: 2014-04-07 | Disposition: A | Discharge status: Home / Self Care | Payer: Medicare Other | Source: Ambulatory Visit | Attending: Thoracic Surgery (Cardiothoracic Vascular Surgery) | Admitting: Thoracic Surgery (Cardiothoracic Vascular Surgery)

## 2014-04-07 ENCOUNTER — Encounter (HOSPITAL_COMMUNITY): Payer: Self-pay

## 2014-04-07 ENCOUNTER — Encounter (HOSPITAL_COMMUNITY): Payer: Medicare Other | Admitting: Vascular Surgery

## 2014-04-07 ENCOUNTER — Ambulatory Visit (HOSPITAL_COMMUNITY): Payer: Medicare Other | Admitting: Anesthesiology

## 2014-04-07 ENCOUNTER — Other Ambulatory Visit: Payer: Self-pay | Admitting: *Deleted

## 2014-04-07 ENCOUNTER — Encounter (HOSPITAL_COMMUNITY)
Admission: RE | Disposition: A | Discharge status: Home / Self Care | Payer: Self-pay | Source: Ambulatory Visit | Attending: Thoracic Surgery (Cardiothoracic Vascular Surgery)

## 2014-04-07 DIAGNOSIS — F411 Generalized anxiety disorder: Secondary | ICD-10-CM | POA: Insufficient documentation

## 2014-04-07 DIAGNOSIS — C3410 Malignant neoplasm of upper lobe, unspecified bronchus or lung: Secondary | ICD-10-CM | POA: Insufficient documentation

## 2014-04-07 DIAGNOSIS — E039 Hypothyroidism, unspecified: Secondary | ICD-10-CM | POA: Insufficient documentation

## 2014-04-07 DIAGNOSIS — IMO0001 Reserved for inherently not codable concepts without codable children: Secondary | ICD-10-CM | POA: Insufficient documentation

## 2014-04-07 DIAGNOSIS — I10 Essential (primary) hypertension: Secondary | ICD-10-CM | POA: Insufficient documentation

## 2014-04-07 DIAGNOSIS — R011 Cardiac murmur, unspecified: Secondary | ICD-10-CM | POA: Insufficient documentation

## 2014-04-07 DIAGNOSIS — E785 Hyperlipidemia, unspecified: Secondary | ICD-10-CM | POA: Insufficient documentation

## 2014-04-07 DIAGNOSIS — M1990 Unspecified osteoarthritis, unspecified site: Secondary | ICD-10-CM | POA: Insufficient documentation

## 2014-04-07 DIAGNOSIS — F17200 Nicotine dependence, unspecified, uncomplicated: Secondary | ICD-10-CM | POA: Insufficient documentation

## 2014-04-07 DIAGNOSIS — C3430 Malignant neoplasm of lower lobe, unspecified bronchus or lung: Secondary | ICD-10-CM | POA: Diagnosis not present

## 2014-04-07 DIAGNOSIS — J181 Lobar pneumonia, unspecified organism: Secondary | ICD-10-CM

## 2014-04-07 DIAGNOSIS — Z85850 Personal history of malignant neoplasm of thyroid: Secondary | ICD-10-CM | POA: Insufficient documentation

## 2014-04-07 DIAGNOSIS — C3431 Malignant neoplasm of lower lobe, right bronchus or lung: Secondary | ICD-10-CM

## 2014-04-07 HISTORY — PX: VIDEO BRONCHOSCOPY WITH ENDOBRONCHIAL ULTRASOUND: SHX6177

## 2014-04-07 SURGERY — BRONCHOSCOPY, WITH EBUS
Anesthesia: General

## 2014-04-07 MED ORDER — DEXTROSE 5 % IV SOLN
10.0000 mg | INTRAVENOUS | Status: DC | PRN
  Administered 2014-04-07: 20 ug/min via INTRAVENOUS

## 2014-04-07 MED ORDER — LIDOCAINE HCL (CARDIAC) 20 MG/ML IV SOLN
INTRAVENOUS | Status: DC | PRN
  Administered 2014-04-07: 40 mg via INTRAVENOUS

## 2014-04-07 MED ORDER — GLYCOPYRROLATE 0.2 MG/ML IJ SOLN
INTRAMUSCULAR | Status: DC | PRN
  Administered 2014-04-07: .6 mg via INTRAVENOUS

## 2014-04-07 MED ORDER — PROMETHAZINE HCL 25 MG/ML IJ SOLN: 6.2500 mg | INTRAMUSCULAR | Status: DC | PRN

## 2014-04-07 MED ORDER — LIDOCAINE HCL 4 % MT SOLN
OROMUCOSAL | Status: DC | PRN
  Administered 2014-04-07: 4 mL via TOPICAL

## 2014-04-07 MED ORDER — PROPOFOL 10 MG/ML IV BOLUS
INTRAVENOUS | Status: DC | PRN
  Administered 2014-04-07: 30 mg via INTRAVENOUS
  Administered 2014-04-07: 50 mg via INTRAVENOUS
  Administered 2014-04-07: 30 mg via INTRAVENOUS
  Administered 2014-04-07: 20 mg via INTRAVENOUS
  Administered 2014-04-07: 200 mg via INTRAVENOUS
  Administered 2014-04-07 (×2): 30 mg via INTRAVENOUS
  Administered 2014-04-07: 10 mg via INTRAVENOUS

## 2014-04-07 MED ORDER — LIDOCAINE HCL (CARDIAC) 20 MG/ML IV SOLN
INTRAVENOUS | Status: AC
  Filled 2014-04-07: qty 10

## 2014-04-07 MED ORDER — NEOSTIGMINE METHYLSULFATE 10 MG/10ML IV SOLN
INTRAVENOUS | Status: AC
  Filled 2014-04-07: qty 3

## 2014-04-07 MED ORDER — SUCCINYLCHOLINE CHLORIDE 20 MG/ML IJ SOLN
INTRAMUSCULAR | Status: AC
  Filled 2014-04-07: qty 1

## 2014-04-07 MED ORDER — 0.9 % SODIUM CHLORIDE (POUR BTL) OPTIME
TOPICAL | Status: DC | PRN
  Administered 2014-04-07: 1000 mL

## 2014-04-07 MED ORDER — EPINEPHRINE HCL 1 MG/ML IJ SOLN
INTRAMUSCULAR | Status: DC | PRN
  Administered 2014-04-07: 1 mg via ENDOTRACHEOPULMONARY

## 2014-04-07 MED ORDER — STERILE WATER FOR INJECTION IJ SOLN
INTRAMUSCULAR | Status: AC
  Filled 2014-04-07: qty 10

## 2014-04-07 MED ORDER — PROPOFOL 10 MG/ML IV BOLUS
INTRAVENOUS | Status: AC
  Filled 2014-04-07: qty 20

## 2014-04-07 MED ORDER — MIDAZOLAM HCL 2 MG/2ML IJ SOLN
INTRAMUSCULAR | Status: AC
  Filled 2014-04-07: qty 2

## 2014-04-07 MED ORDER — DEXAMETHASONE SODIUM PHOSPHATE 10 MG/ML IJ SOLN
INTRAMUSCULAR | Status: AC
  Filled 2014-04-07: qty 1

## 2014-04-07 MED ORDER — MIDAZOLAM HCL 5 MG/5ML IJ SOLN
INTRAMUSCULAR | Status: DC | PRN
  Administered 2014-04-07: 2 mg via INTRAVENOUS

## 2014-04-07 MED ORDER — FENTANYL CITRATE 0.05 MG/ML IJ SOLN
INTRAMUSCULAR | Status: AC
  Filled 2014-04-07: qty 5

## 2014-04-07 MED ORDER — GLYCOPYRROLATE 0.2 MG/ML IJ SOLN
INTRAMUSCULAR | Status: AC
  Filled 2014-04-07: qty 3

## 2014-04-07 MED ORDER — EPHEDRINE SULFATE 50 MG/ML IJ SOLN
INTRAMUSCULAR | Status: AC
  Filled 2014-04-07: qty 1

## 2014-04-07 MED ORDER — EPINEPHRINE HCL 1 MG/ML IJ SOLN
INTRAMUSCULAR | Status: AC
  Filled 2014-04-07: qty 1

## 2014-04-07 MED ORDER — LACTATED RINGERS IV SOLN
INTRAVENOUS | Status: DC | PRN
  Administered 2014-04-07 (×2): via INTRAVENOUS

## 2014-04-07 MED ORDER — HYDROMORPHONE HCL PF 1 MG/ML IJ SOLN: 0.2500 mg | INTRAMUSCULAR | Status: DC | PRN

## 2014-04-07 MED ORDER — ROCURONIUM BROMIDE 100 MG/10ML IV SOLN
INTRAVENOUS | Status: DC | PRN
  Administered 2014-04-07: 30 mg via INTRAVENOUS

## 2014-04-07 MED ORDER — NEOSTIGMINE METHYLSULFATE 10 MG/10ML IV SOLN
INTRAVENOUS | Status: DC | PRN
  Administered 2014-04-07: 4 mg via INTRAVENOUS

## 2014-04-07 MED ORDER — ROCURONIUM BROMIDE 50 MG/5ML IV SOLN
INTRAVENOUS | Status: AC
  Filled 2014-04-07: qty 1

## 2014-04-07 MED ORDER — FENTANYL CITRATE 0.05 MG/ML IJ SOLN
INTRAMUSCULAR | Status: DC | PRN
  Administered 2014-04-07 (×2): 50 ug via INTRAVENOUS

## 2014-04-07 MED ORDER — ONDANSETRON HCL 4 MG/2ML IJ SOLN
INTRAMUSCULAR | Status: AC
  Filled 2014-04-07: qty 2

## 2014-04-07 MED ORDER — DEXAMETHASONE SODIUM PHOSPHATE 10 MG/ML IJ SOLN
INTRAMUSCULAR | Status: DC | PRN
  Administered 2014-04-07: 10 mg via INTRAVENOUS

## 2014-04-07 MED ORDER — ONDANSETRON HCL 4 MG/2ML IJ SOLN
INTRAMUSCULAR | Status: DC | PRN
  Administered 2014-04-07: 4 mg via INTRAVENOUS

## 2014-04-07 SURGICAL SUPPLY — 31 items
BRUSH CYTOL CELLEBRITY 1.5X140 (MISCELLANEOUS) IMPLANT
CANISTER SUCTION 2500CC (MISCELLANEOUS) ×3 IMPLANT
CONT SPEC 4OZ CLIKSEAL STRL BL (MISCELLANEOUS) ×7 IMPLANT
COVER TABLE BACK 60X90 (DRAPES) ×3 IMPLANT
FILTER STRAW FLUID ASPIR (MISCELLANEOUS) ×2 IMPLANT
FORCEPS BIOP RJ4 1.8 (CUTTING FORCEPS) ×2 IMPLANT
FORCEPS RADIAL JAW LRG 4 PULM (INSTRUMENTS) IMPLANT
GAUZE SPONGE 4X4 12PLY STRL (GAUZE/BANDAGES/DRESSINGS) ×2 IMPLANT
GLOVE SURG SIGNA 7.5 PF LTX (GLOVE) ×3 IMPLANT
GLOVE SURG SS PI 7.0 STRL IVOR (GLOVE) ×2 IMPLANT
GOWN STRL REUS W/ TWL XL LVL3 (GOWN DISPOSABLE) ×1 IMPLANT
GOWN STRL REUS W/TWL XL LVL3 (GOWN DISPOSABLE) ×6
KIT ROOM TURNOVER OR (KITS) ×3 IMPLANT
MARKER SKIN DUAL TIP RULER LAB (MISCELLANEOUS) ×3 IMPLANT
NDL BIOPSY TRANSBRONCH 21G (NEEDLE) IMPLANT
NDL BLUNT 18X1 FOR OR ONLY (NEEDLE) IMPLANT
NEEDLE BIOPSY TRANSBRONCH 21G (NEEDLE) IMPLANT
NEEDLE BLUNT 18X1 FOR OR ONLY (NEEDLE) IMPLANT
NEEDLE SYS SONOTIP II EBUSTBNA (NEEDLE) ×3 IMPLANT
NS IRRIG 1000ML POUR BTL (IV SOLUTION) ×3 IMPLANT
OIL SILICONE PENTAX (PARTS (SERVICE/REPAIRS)) ×2 IMPLANT
PAD ARMBOARD 7.5X6 YLW CONV (MISCELLANEOUS) ×6 IMPLANT
RADIAL JAW LRG 4 PULMONARY (INSTRUMENTS) ×2
SYR 20CC LL (SYRINGE) ×3 IMPLANT
SYR 20ML ECCENTRIC (SYRINGE) ×5 IMPLANT
SYR 5ML LL (SYRINGE) ×3 IMPLANT
SYR 5ML LUER SLIP (SYRINGE) ×3 IMPLANT
TOWEL OR 17X24 6PK STRL BLUE (TOWEL DISPOSABLE) ×3 IMPLANT
TRAP SPECIMEN MUCOUS 40CC (MISCELLANEOUS) ×3 IMPLANT
TUBE CONNECTING 12'X1/4 (SUCTIONS) ×1
TUBE CONNECTING 12X1/4 (SUCTIONS) ×2 IMPLANT

## 2014-04-07 NOTE — Transfer of Care (Signed)
Immediate Anesthesia Transfer of Care Note  Patient: [name]  Procedure(s) Performed: Procedure(s): VIDEO BRONCHOSCOPY WITH ENDOBRONCHIAL ULTRASOUND (N/A)  Patient Location: PACU  Anesthesia Type:General  Level of Consciousness: awake, alert  and oriented  Airway & Oxygen Therapy: Patient Spontanous Breathing and Patient connected to nasal cannula oxygen  Post-op Assessment: Report given to PACU RN, Post -op Vital signs reviewed and stable and Patient moving all extremities X 4  Post vital signs: Reviewed and stable  Complications: No apparent anesthesia complications

## 2014-04-07 NOTE — Brief Op Note (Signed)
04/07/2014  9:35 AM  PATIENT:  [name]  68 y.o. 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   Delay start of Pharmacological VTE agent (>24hrs) due to surgical blood loss or risk of bleeding: not applicable  FINDINGS: Endobronchial mass obstructing RLL bronchus- Frozen= Non-small cell carcinoma

## 2014-04-07 NOTE — Anesthesia Procedure Notes (Signed)
Procedure Name: Intubation Date/Time: 04/07/2014 7:38 AM Performed by: Blair Heys E Pre-anesthesia Checklist: Patient identified, Emergency Drugs available, Suction available and Patient being monitored Patient Re-evaluated:Patient Re-evaluated prior to inductionOxygen Delivery Method: Circle system utilized Preoxygenation: Pre-oxygenation with 100% oxygen Intubation Type: IV induction Ventilation: Mask ventilation without difficulty Laryngoscope Size: Miller and 2 Grade View: Grade I Tube type: Oral Tube size: 8.0 mm Number of attempts: 1 Airway Equipment and Method: Stylet and LTA kit utilized Placement Confirmation: ETT inserted through vocal cords under direct vision,  positive ETCO2 and breath sounds checked- equal and bilateral Secured at: 21 cm Tube secured with: Tape Dental Injury: Teeth and Oropharynx as per pre-operative assessment

## 2014-04-07 NOTE — Anesthesia Preprocedure Evaluation (Addendum)
Anesthesia Evaluation  Patient identified by MRN, date of birth, ID band Patient awake    Reviewed: Allergy & Precautions, H&P , NPO status , Patient's Chart, lab work & pertinent test results  History of Anesthesia Complications (+) history of anesthetic complications  Airway Mallampati: III TM Distance: >3 FB Neck ROM: Full    Dental  (+) Dental Advisory Given, Teeth Intact   Pulmonary shortness of breath and with exertion, Current Smoker,  breath sounds clear to auscultation        Cardiovascular hypertension, Rhythm:Regular Rate:Normal     Neuro/Psych PSYCHIATRIC DISORDERS Anxiety    GI/Hepatic   Endo/Other  Hypothyroidism   Renal/GU      Musculoskeletal  (+) Arthritis -, Fibromyalgia -  Abdominal   Peds  Hematology   Anesthesia Other Findings   Reproductive/Obstetrics                          Anesthesia Physical Anesthesia Plan  ASA: III  Anesthesia Plan: General   Post-op Pain Management:    Induction: Intravenous  Airway Management Planned: Oral ETT  Additional Equipment:   Intra-op Plan:   Post-operative Plan: Extubation in OR  Informed Consent: I have reviewed the patients History and Physical, chart, labs and discussed the procedure including the risks, benefits and alternatives for the proposed anesthesia with the patient or authorized representative who has indicated his/her understanding and acceptance.   Dental advisory given  Plan Discussed with: CRNA, Anesthesiologist and Surgeon  Anesthesia Plan Comments:         Anesthesia Quick Evaluation

## 2014-04-07 NOTE — Progress Notes (Signed)
         Images taken from EBUS procedure performed on 04/07/2014.

## 2014-04-07 NOTE — Discharge Instructions (Addendum)
Do not drive or engage in heavy physical activity for 24 hours  You may resume normal activities tomorrow  QUIT SMOKING!  You will likely cough up small amounts of blood over the next few days  You may use over the counter cough medication as needed  My office will contact you with follow up information  Call [phone] if you develop fever > 101, shortness of breath, chest pain or cough up large amounts of blood.

## 2014-04-07 NOTE — H&P (View-Only) (Signed)
PCP is Asencion Noble, MD Referring Provider is Asencion Noble, MD  Chief Complaint  Patient presents with  . Referral    for RLLCONSOLIDATION per CT CHEST 03/23/14    HPI: 68 year old woman sent for consultation regarding right lower lobe consolidation.  [name] is a 68 year old woman with a long history of heavy tobacco abuse who has had a persistent cough for the last 3-4 months. She says that she's been under a lot of stress for the past 4-5 months. Little over 3 months ago she noted a cough. She often has had a cough over the years with smoking and has had multiple episodes of bronchitis. Her cough was initially nonproductive, but became productive over time. She is a clear sputum. She has not had any blood in the sputum. She does have some chest discomfort with coughing. She also is having a lot of wheezing lately. She had a chest x-ray which showed a right lower lobe infiltrate. This was followed by a CT of the chest which showed consolidation of the right lower lobe and a possible mass with extrinsic obstruction of the bronchus. She was treated with Levaquin and finishes that tomorrow.    She has lost 9 pounds over the past 6 months and 4 pounds over the past 3 months. She has not had any fevers. She had one episode of chills 2 weeks ago. She has had severe night sweats. She has not had any anginal chest pain. She does complain of loss of appetite and decreased energy.  ECOG/ZUBROD= 0   Past Medical History  Diagnosis Date  . Hypertension   . Hyperlipidemia   . Thyroid disease     hypothyroidism  . Hx of thyroid cancer   . Status post bilateral breast implants   . Consolidation lung 03/21/14    RLL PER CXR/CT    Past Surgical History  Procedure Laterality Date  . Thyroidectomy    . Cataract extraction Right   . Breast enhancement surgery    . Retinal detachment surgery      Family History  Problem Relation Age of Onset  . Early death Mother   . Heart disease Mother   .  Early death Father   . Heart disease Father     MI  . Cancer Sister     BREAST  . Heart disease Sister     MI 2013    Social History History  Substance Use Topics  . Smoking status: Current Every Day Smoker -- 1.50 packs/day for 49 years    Types: Cigarettes  . Smokeless tobacco: Never Used  . Alcohol Use: Yes     Comment: SOCIAL    Current Outpatient Prescriptions  Medication Sig Dispense Refill  . aspirin EC 81 MG tablet Take 81 mg by mouth daily.      Marland Kitchen estradiol (ESTRACE) 0.5 MG tablet Take 0.5 mg by mouth daily.      Marland Kitchen levofloxacin (LEVAQUIN) 750 MG tablet Take 750 mg by mouth daily. X 7 days      . levothyroxine (SYNTHROID, LEVOTHROID) 125 MCG tablet Take 125 mcg by mouth daily before breakfast. TAKE 2 TABLETS EVERY MORNING BEFORE BREAKFAST...255mcq      . medroxyPROGESTERone (PROVERA) 2.5 MG tablet Take 2.5 mg by mouth daily.       No current facility-administered medications for this visit.    Allergies  Allergen Reactions  . Niacin And Related Rash    Review of Systems  Constitutional: Positive for chills (one episode), diaphoresis,  activity change, appetite change, fatigue and unexpected weight change. Negative for fever.  Respiratory: Positive for cough, shortness of breath and wheezing.   Cardiovascular: Positive for chest pain (Pleuritic).  Gastrointestinal: Positive for nausea (secondary to Levaquin) and vomiting (Secondary to Levaquin).  Musculoskeletal: Positive for arthralgias and joint swelling.  Hematological: Negative for adenopathy. Does not bruise/bleed easily.  Psychiatric/Behavioral: Positive for sleep disturbance and dysphoric mood. The patient is nervous/anxious.   All other systems reviewed and are negative.   BP 150/85  Pulse 102  Resp 16  Ht 5' 8.5" (1.74 m)  Wt 144 lb (65.318 kg)  BMI 21.57 kg/m2  SpO2 96% Physical Exam  Vitals reviewed. Constitutional: She is oriented to person, place, and time. She appears well-developed and  well-nourished.  HENT:  Head: Normocephalic and atraumatic.  Eyes: EOM are normal. Pupils are equal, round, and reactive to light.  Neck: Normal range of motion. No thyromegaly present.  Cardiovascular: Normal rate and regular rhythm.   Murmur (Faint systolic) heard. Pulmonary/Chest: Effort normal. She has no wheezes.  Diminished breath sounds right base  Abdominal: Soft. There is no tenderness.  Musculoskeletal: She exhibits no edema.  Lymphadenopathy:    She has no cervical adenopathy.  Neurological: She is alert and oriented to person, place, and time. No cranial nerve deficit.  Skin: Skin is warm and dry.     Diagnostic Tests: CT CHEST WITH CONTRAST  TECHNIQUE:  Multidetector CT imaging of the chest was performed during  intravenous contrast administration.  CONTRAST: 39mL OMNIPAQUE IOHEXOL 300 MG/ML SOLN  COMPARISON: Chest radiograph 10/19/2013 and 10/25/2007  FINDINGS:  The left lung is clear. There are no pleural or pericardial  effusions. Thyroid gland not identified. Thoracic inlet normal.  Bilateral breast implants identified. No acute musculoskeletal  findings ; osseous thorax is intact.  Left lung is clear except for tiny focus of scarring or peripheral  atelectasis left apex. On the right, there is extensive lower lobe  consolidation. There is densely consolidated lung in the central to  lateral aspect of the basilar segments of the lower lobe. Medially  there is some aeration with multiple rounded foci of ill-defined  opacity measuring around 1-1.5 cm each.  Contrast opacification is suboptimal. There does appear to be excess  soft tissue in the right hilum. In the mid to inferior right hilum  there is subtle low-attenuation soft tissue. There is a suggestion  of a mass in the inferior right hilum measuring 18 x 18 x 12 mm.  This could represent adenopathy but it may also represent  consolidated lung. Bronchogenic carcinoma is another possibility.  There does  appear to be mass effect on the branches of the airway  supplying the right base. There is evidence of extrinsic mass effect  on lower lobe bronchi, with no endobronchial lesions identified.  There is a pretracheal lymph node measuring 8 mm. There is an 8 mm  precarinal lymph node.  Images through the upper abdomen partially visualized the adrenal  glands and there is a left adrenal nodule measuring at least 14 mm.  IMPRESSION:  1. Extensive right lower lobe consolidation. Densely consolidated  right lower lobe with rounded areas of opacity medially, most  consistent with pneumonia or postobstructive pneumonitis.  Radiographic followup after appropriate therapy with followup CT  scan should be performed to ensure resolution, particularly of the  more nodular foci of opacity to ensure that these do not represent  mass.  2. Soft tissue in the right hilum, which  may represent a combination  of at adenopathy and consolidated lung the, but there is a masslike  focus in the inferior right hilum seen best on axial images 31  through 35. There is evidence of extrinsic mass effect on lower lobe  bronchi which may be causing airway obstruction. Bronchogenic  carcinoma not excluded.  3. Borderline mediastinal lymph nodes  4. Partially visualized left adrenal nodule measuring at least 14  mm. CT abdomen pelvis, or adrenal protocol MRI, could be considered  to further evaluate for the possibility of a significant left  adrenal lesion.  Electronically Signed  By: Skipper Cliche M.D.  On: 03/24/2014 08:37         Impression: 68 year old woman with a history of heavy tobacco abuse who presents with a 3+ month history of coughing. A chest x-ray and then CT scan showed dense consolidation right lower lobe. A CT scan shows a suggestion of a mass which could be causing obstruction of the right lower lobe bronchus. Given her smoking history and the CT findings I think bronchoscopy is indicated to  further evaluate. She also has some mildly enlarged mediastinal and right hilar lymph nodes. This may all be reactive, but I think an endobronchial ultrasound to evaluate those nodes would also be appropriate.  I reviewed the films and discussed the differential diagnosis with Mr. and [name].  I discussed the proposed operative procedure which is bronchoscopy and endobronchial ultrasound under general anesthesia with them. We discussed the indications, risks, benefits, and alternatives. I informed him of the general nature of the procedure and the reasoning behind using general anesthesia. They do understand this as an outpatient procedure. She understands the risks include, but are not limited to death, MI, stroke, bleeding, pneumothorax, failure to make a diagnosis, as well as the possibility of unforeseeable complications. She accepts the risks and agrees to proceed.  Plan: She will complete her antibiotics as prescribed.  Bronchoscopy and endobronchial ultrasound on Friday, 04/07/2014.

## 2014-04-07 NOTE — Op Note (Signed)
NAME:  JEFF, MCCALLUM                  ACCOUNT NO.:  [id number]  MEDICAL RECORD NO.:  [id number]  LOCATION:  [facility]                         FACILITY:  [facility]  PHYSICIAN:  Revonda Standard. Roxan Hockey, M.D.DATE OF BIRTH:  [birth year]  DATE OF PROCEDURE:  04/07/2014 DATE OF DISCHARGE:                              OPERATIVE REPORT   PREOPERATIVE DIAGNOSIS:  Persistent right lower lobe consolidation.  POSTOPERATIVE DIAGNOSIS:  Non-small cell carcinoma, right lower lobe.  PROCEDURE:  Video bronchoscopy with biopsy and debridement of endobronchial mass, endobronchial ultrasound with needle aspiration of paratracheal lymph nodes.  SURGEON:  Revonda Standard. Roxan Hockey, M.D.  ASSISTANT:  None.  ANESTHESIA:  General.  FINDINGS:  Endobronchial mass lesion obstructing the right lower lobe bronchus and some mild extrinsic narrowing of the right middle lobe bronchus.  Biopsies revealed non-small-cell carcinoma, purulent secretions from airways once mass debrided.  CLINICAL NOTE:  Ms. Grissett is a 68 year old woman with a history of tobacco abuse who has had a persistent cough for the last 3-4 months. Chest x-ray showed a right lower lobe infiltrate.  CT of the chest showed consolidation in the right lower lobe and question of a mass. She was advised to undergo bronchoscopy and endobronchial ultrasound for diagnostic and staging purposes.  The indications, risks, benefits, and alternatives were discussed in detail with the patient.  She understood and accepted the risks and agreed to proceed.  OPERATIVE NOTE:  Ms. Kittle was brought to the operating room on April 07, 2014.  She had induction of general anesthesia and was intubated. Flexible fiberoptic bronchoscopy was performed via the endotracheal tube.  There was normal endobronchial anatomy on the left side.  On the right side, there was an endobronchial mass lesion obstructing the right lower lobe bronchus and extrinsically compressing the origin of  the right middle lobe bronchus.  The bronchoscope was withdrawn.  The endobronchial ultrasound probe was advanced, the paratracheal nodes were evaluated, there were nodes in the level 7 and level 4R locations that were accessible for aspiration.  Level 7 node was aspirated first. With each aspiration, the needle was advanced into the node with ultrasound visualization and 10-12 passes were made with the needle.  A portion of each specimen was sent on slides for quick prep, and the remainder were sent in cytolite for permanent pathologic examination. Three aspirations were performed on the level 7 node and then 2 aspirations were performed on the level 4R node.  These were sent for quick prep.  The endobronchial ultrasound probe was removed.  While awaiting the results of the quick prep, the bronchoscope was reinserted, the right lower lobe mass was identified, biopsies were taken and sent for frozen section.  Additional biopsies were taken and the mass was debrided in hope to reestablish the lower lobe airway.  Two of the segmental airways were reestablished, there was purulent secretions from them, these were sent for cultures.  After the mass had been debrided as much as was feasible, dilute epinephrine was applied. There was no ongoing significant bleeding.  The bronchoscope was removed.  The preliminary result showed non-small cell carcinoma of the right lower lobe bronchus.  The initial quick prep on  4R and 7 nodes were negative for tumor.  The patient was extubated in the operating room and taken to the postanesthetic care unit in good condition.     Revonda Standard Roxan Hockey, M.D.     SCH/MEDQ  D:  04/07/2014  T:  04/07/2014  Job:  [id number]

## 2014-04-07 NOTE — Interval H&P Note (Signed)
History and Physical Interval Note:  04/07/2014 7:17 AM  [name]  has presented today for surgery, with the diagnosis of Right Lower lobe consolidation  The various methods of treatment have been discussed with the patient and family. After consideration of risks, benefits and other options for treatment, the patient has consented to  Procedure(s): [place] (N/A) as a surgical intervention .  The patient's history has been reviewed, patient examined, no change in status, stable for surgery.  I have reviewed the patient's chart and labs.  Questions were answered to the patient's satisfaction.     Tiffiany Beadles C

## 2014-04-09 LAB — CULTURE, RESPIRATORY W GRAM STAIN

## 2014-04-09 LAB — CULTURE, RESPIRATORY

## 2014-04-10 ENCOUNTER — Encounter (HOSPITAL_COMMUNITY): Payer: Self-pay | Admitting: Thoracic Surgery (Cardiothoracic Vascular Surgery)

## 2014-04-10 NOTE — Anesthesia Postprocedure Evaluation (Signed)
  Anesthesia Post-op Note  Patient: [name]  Procedure(s) Performed: Procedure(s): VIDEO BRONCHOSCOPY WITH ENDOBRONCHIAL ULTRASOUND (N/A)  Patient Location: PACU  Anesthesia Type:General  Level of Consciousness: awake, alert  and oriented  Airway and Oxygen Therapy: Patient Spontanous Breathing  Post-op Pain: none  Post-op Assessment: Post-op Vital signs reviewed  Post-op Vital Signs: Reviewed  Last Vitals:  Filed Vitals:   04/07/14 1015  BP: 113/60  Pulse: 84  Temp:   Resp:     Complications: No apparent anesthesia complications

## 2014-04-13 ENCOUNTER — Telehealth: Payer: Self-pay | Admitting: *Deleted

## 2014-04-13 NOTE — Telephone Encounter (Signed)
Called pt with appt for thoracic clinic on 04/20/14 arrival at 3:15 for Rad Onc. And appt with Dr. Julien Nordmann on 04/21/14 arrival at 10:45.  She verbalized understanding of appt time and place.

## 2014-04-19 ENCOUNTER — Encounter (HOSPITAL_COMMUNITY): Payer: Self-pay

## 2014-04-19 ENCOUNTER — Ambulatory Visit (HOSPITAL_COMMUNITY)
Admission: RE | Admit: 2014-04-19 | Discharge: 2014-04-19 | Disposition: A | Discharge status: Home / Self Care | Payer: Medicare Other | Source: Ambulatory Visit | Attending: Thoracic Surgery (Cardiothoracic Vascular Surgery) | Admitting: Thoracic Surgery (Cardiothoracic Vascular Surgery)

## 2014-04-19 DIAGNOSIS — C3430 Malignant neoplasm of lower lobe, unspecified bronchus or lung: Secondary | ICD-10-CM | POA: Insufficient documentation

## 2014-04-19 DIAGNOSIS — R51 Headache: Secondary | ICD-10-CM | POA: Insufficient documentation

## 2014-04-19 DIAGNOSIS — C3431 Malignant neoplasm of lower lobe, right bronchus or lung: Secondary | ICD-10-CM

## 2014-04-19 LAB — PULMONARY FUNCTION TEST
DL/VA % PRED: 58 %
DL/VA: 3.05 ml/min/mmHg/L
DLCO UNC: 9.89 ml/min/mmHg
DLCO unc % pred: 33 %
FEF 25-75 POST: 1.4 L/s
FEF 25-75 Pre: 1.19 L/sec
FEF2575-%Change-Post: 17 %
FEF2575-%PRED-POST: 60 %
FEF2575-%PRED-PRE: 51 %
FEV1-%Change-Post: 4 %
FEV1-%PRED-PRE: 56 %
FEV1-%Pred-Post: 58 %
FEV1-POST: 1.63 L
FEV1-Pre: 1.56 L
FEV1FVC-%CHANGE-POST: 6 %
FEV1FVC-%Pred-Pre: 97 %
FEV6-%CHANGE-POST: -1 %
FEV6-%PRED-POST: 58 %
FEV6-%Pred-Pre: 59 %
FEV6-Post: 2.05 L
FEV6-Pre: 2.08 L
FEV6FVC-%Pred-Post: 104 %
FEV6FVC-%Pred-Pre: 104 %
FVC-%CHANGE-POST: -1 %
FVC-%PRED-POST: 56 %
FVC-%Pred-Pre: 57 %
FVC-POST: 2.05 L
FVC-Pre: 2.08 L
POST FEV1/FVC RATIO: 80 %
Post FEV6/FVC ratio: 100 %
Pre FEV1/FVC ratio: 75 %
Pre FEV6/FVC Ratio: 100 %
RV % PRED: 74 %
RV: 1.72 L
TLC % pred: 66 %
TLC: 3.75 L

## 2014-04-19 LAB — GLUCOSE, CAPILLARY: Glucose-Capillary: 92 mg/dL (ref 70–99)

## 2014-04-19 MED ORDER — ALBUTEROL SULFATE (2.5 MG/3ML) 0.083% IN NEBU
2.5000 mg | INHALATION_SOLUTION | Freq: Once | RESPIRATORY_TRACT | Status: AC
  Administered 2014-04-19: 2.5 mg via RESPIRATORY_TRACT

## 2014-04-19 MED ORDER — GADOBENATE DIMEGLUMINE 529 MG/ML IV SOLN
14.0000 mL | Freq: Once | INTRAVENOUS | Status: AC | PRN
  Administered 2014-04-19: 14 mL via INTRAVENOUS

## 2014-04-19 MED ORDER — FLUDEOXYGLUCOSE F - 18 (FDG) INJECTION: 6.9500 | Freq: Once | INTRAVENOUS | Status: DC | PRN

## 2014-04-20 ENCOUNTER — Encounter: Payer: Self-pay | Admitting: *Deleted

## 2014-04-20 ENCOUNTER — Ambulatory Visit (HOSPITAL_BASED_OUTPATIENT_CLINIC_OR_DEPARTMENT_OTHER): Payer: Medicare Other | Admitting: Internal Medicine

## 2014-04-20 ENCOUNTER — Telehealth: Payer: Self-pay | Admitting: Hematology

## 2014-04-20 ENCOUNTER — Other Ambulatory Visit (HOSPITAL_BASED_OUTPATIENT_CLINIC_OR_DEPARTMENT_OTHER): Payer: Medicare Other

## 2014-04-20 ENCOUNTER — Telehealth: Payer: Self-pay | Admitting: *Deleted

## 2014-04-20 ENCOUNTER — Other Ambulatory Visit: Payer: Self-pay | Admitting: *Deleted

## 2014-04-20 ENCOUNTER — Encounter: Payer: Self-pay | Admitting: Internal Medicine

## 2014-04-20 ENCOUNTER — Encounter: Payer: Self-pay | Admitting: Medical Oncology

## 2014-04-20 ENCOUNTER — Ambulatory Visit
Admission: RE | Admit: 2014-04-20 | Discharge: 2014-04-20 | Disposition: A | Discharge status: Home / Self Care | Payer: Medicare Other | Source: Ambulatory Visit | Attending: Radiation Oncology | Admitting: Radiation Oncology

## 2014-04-20 ENCOUNTER — Ambulatory Visit: Payer: Medicare Other | Attending: Radiation Oncology | Admitting: Physical Therapy

## 2014-04-20 VITALS — BP 192/97 | HR 97 | Temp 98.3°F | Resp 16 | Wt 145.8 lb

## 2014-04-20 VITALS — BP 192/97 | HR 97 | Temp 98.3°F | Resp 18 | Ht 68.5 in | Wt 145.8 lb

## 2014-04-20 DIAGNOSIS — R059 Cough, unspecified: Secondary | ICD-10-CM

## 2014-04-20 DIAGNOSIS — M2550 Pain in unspecified joint: Secondary | ICD-10-CM | POA: Insufficient documentation

## 2014-04-20 DIAGNOSIS — IMO0001 Reserved for inherently not codable concepts without codable children: Secondary | ICD-10-CM | POA: Insufficient documentation

## 2014-04-20 DIAGNOSIS — C3491 Malignant neoplasm of unspecified part of right bronchus or lung: Secondary | ICD-10-CM

## 2014-04-20 DIAGNOSIS — C3430 Malignant neoplasm of lower lobe, unspecified bronchus or lung: Secondary | ICD-10-CM

## 2014-04-20 DIAGNOSIS — R079 Chest pain, unspecified: Secondary | ICD-10-CM

## 2014-04-20 DIAGNOSIS — J181 Lobar pneumonia, unspecified organism: Secondary | ICD-10-CM

## 2014-04-20 DIAGNOSIS — R05 Cough: Secondary | ICD-10-CM

## 2014-04-20 DIAGNOSIS — Z803 Family history of malignant neoplasm of breast: Secondary | ICD-10-CM

## 2014-04-20 DIAGNOSIS — I10 Essential (primary) hypertension: Secondary | ICD-10-CM

## 2014-04-20 DIAGNOSIS — F17200 Nicotine dependence, unspecified, uncomplicated: Secondary | ICD-10-CM

## 2014-04-20 LAB — CBC WITH DIFFERENTIAL/PLATELET
BASO%: 0.2 % (ref 0.0–2.0)
Basophils Absolute: 0 10*3/uL (ref 0.0–0.1)
EOS%: 3.6 % (ref 0.0–7.0)
Eosinophils Absolute: 0.4 10*3/uL (ref 0.0–0.5)
HEMATOCRIT: 36.4 % (ref 34.8–46.6)
HGB: 11.9 g/dL (ref 11.6–15.9)
LYMPH#: 3.4 10*3/uL — AB (ref 0.9–3.3)
LYMPH%: 30 % (ref 14.0–49.7)
MCH: 29.3 pg (ref 25.1–34.0)
MCHC: 32.6 g/dL (ref 31.5–36.0)
MCV: 90 fL (ref 79.5–101.0)
MONO#: 0.8 10*3/uL (ref 0.1–0.9)
MONO%: 7.4 % (ref 0.0–14.0)
NEUT#: 6.7 10*3/uL — ABNORMAL HIGH (ref 1.5–6.5)
NEUT%: 58.8 % (ref 38.4–76.8)
Platelets: 407 10*3/uL — ABNORMAL HIGH (ref 145–400)
RBC: 4.04 10*6/uL (ref 3.70–5.45)
RDW: 14 % (ref 11.2–14.5)
WBC: 11.4 10*3/uL — AB (ref 3.9–10.3)

## 2014-04-20 LAB — COMPREHENSIVE METABOLIC PANEL (CC13)
ALBUMIN: 3.4 g/dL — AB (ref 3.5–5.0)
ALT: 17 U/L (ref 0–55)
ANION GAP: 11 meq/L (ref 3–11)
AST: 15 U/L (ref 5–34)
Alkaline Phosphatase: 173 U/L — ABNORMAL HIGH (ref 40–150)
BUN: 9.6 mg/dL (ref 7.0–26.0)
CHLORIDE: 109 meq/L (ref 98–109)
CO2: 23 meq/L (ref 22–29)
CREATININE: 0.7 mg/dL (ref 0.6–1.1)
Calcium: 9.6 mg/dL (ref 8.4–10.4)
Glucose: 88 mg/dl (ref 70–140)
Potassium: 4.3 mEq/L (ref 3.5–5.1)
SODIUM: 142 meq/L (ref 136–145)
TOTAL PROTEIN: 7.6 g/dL (ref 6.4–8.3)
Total Bilirubin: 0.25 mg/dL (ref 0.20–1.20)

## 2014-04-20 MED ORDER — CLONIDINE HCL 0.1 MG PO TABS
0.2000 mg | ORAL_TABLET | Freq: Once | ORAL | Status: AC
  Administered 2014-04-20: 0.2 mg via ORAL

## 2014-04-20 MED ORDER — HYDROCODONE-HOMATROPINE 5-1.5 MG/5ML PO SYRP: 5.0000 mL | ORAL_SOLUTION | Freq: Four times a day (QID) | ORAL | Status: DC | PRN

## 2014-04-20 MED ORDER — PROCHLORPERAZINE MALEATE 10 MG PO TABS: 10.0000 mg | ORAL_TABLET | Freq: Four times a day (QID) | ORAL | Status: DC | PRN

## 2014-04-20 NOTE — Progress Notes (Addendum)
[place] [phone]   [phone] THORACIC CLINIC  CONSULT NOTE  REFERRING PHYSICIAN: Dr. Modesto Charon  REASON FOR CONSULTATION:  68 years old white female recently diagnosed with lung cancer.  HPI [name] is a 68 y.o. female was past medical history significant for hypertension, dyslipidemia, hypothyroidism secondary to thyroidectomy as well as back surgery and right eye blindness. The patient also has a long history of smoking and still a current smoker. She mentions that in July of 2015 she has been complaining of frequent episodes of bronchitis with cough productive of yellowish sputum and wheezes. Her symptoms were getting worse and she was seen by her primary care physician Dr. Willey Blade. Chest x-ray was performed on 03/21/2014 and it showed the infiltrate and extensive atelectasis in the right lower lobe. The possibility of bronchus obstruction is considered. This was followed by CT scan of the chest with contrast on 03/24/2014 and it showed extensive right lower lobe consolidation. There was densely consolidated right lower lobe with rounded area of opacity medially, most consistent with pneumonia or postobstructive pneumonitis. There was also soft tissue in the right hilum measuring 1.8 x 1.8 x 1.2 CM which May represent a combination of adenopathy and consolidated lung. There was evidence of extrinsic mass effect on the lower lobe bronchi which may be causing airway obstruction and bronchogenic carcinoma he is not excluded. There was also borderline mediastinal lymph nodes and partially visualized left adrenal nodule measuring at least 1.4 CM. The patient was referred to Dr. Roxan Hockey and on 04/07/2014 she underwent Video bronchoscopy with biopsy and debridement of endobronchial mass, endobronchial ultrasound with needle aspiration of paratracheal lymph nodes. The final pathology of the biopsy of the right lower lobe as well as right upper lobe  (Accession: [id number]) was consistent with invasive squamous cell carcinoma. A PET scan as well as MRI of the brain were performed on 04/19/2014. The PET scan showed primary bronchogenic carcinoma centered lateral to the origin of the right lower lobe bronchus. There was mediastinal and right hilar hypermetabolic nodes, highly suspicious for metastatic disease. There was also a hypermetabolic right upper lobe pulmonary nodule suspicious for pulmonary metastases versus a synchronous primary bronchogenic carcinoma. There was improved right lower lobe radiation and persistent patchy hypermetabolic opacity which is likely related to postobstructive pneumonitis. There was no evidence of extrathoracic metastases. There was also new left lower lobe airspace disease which is suspicious for infection. MRI of the brain showed no acute or metastatic intracranial abnormality. Dr. Roxan Hockey kindly referred the patient to me today for further evaluation and recommendation regarding treatment of her condition. When seen today the patient continues to complain of dry cough and discomfort in the right side of the chest. She also has shortness breath with exertion but no significant hemoptysis. She denied having any significant weight loss or night sweats. The patient has no nausea or vomiting. She has no headache or visual changes. Family history significant for father and mother died from heart disease and a sister had breast cancer. The patient is married and was accompanied by her husband [name]. She has one daughter and 2 grandchildren. She used to work as a Teaching laboratory technician. She has a history of smoking up to 2 pack per day for around 40 years and unfortunately she continues to smoke around 10 cigarettes every day. I strongly encouraged the patient to quit smoking and offered her smoke cessation program. She also drinks 3 glasses of wine every night and no  history of drug abuse.  HPI  Past Medical  History  Diagnosis Date  . Thyroid disease     hypothyroidism  . Hx of thyroid cancer   . Status post bilateral breast implants   . Consolidation lung 03/21/14    RLL PER CXR/CT  . Hypertension     just started,not on medication  . Shortness of breath     exertion  . Hypothyroidism   . Mitral valve prolapse   . Anxiety   . Arthritis   . Cancer     thyroid  . Fibromyalgia   . Complication of anesthesia     headaches,very agitated while waking  up    Past Surgical History  Procedure Laterality Date  . Thyroidectomy    . Cataract extraction Right   . Breast enhancement surgery    . Retinal detachment surgery    . Eye surgery    . Pars plana vitrectomy w/ repair of macular hole    . Back surgery      times 2  . Tonsillectomy    . Cervical cone biopsy    . Video bronchoscopy with endobronchial ultrasound N/A 04/07/2014    Procedure: VIDEO BRONCHOSCOPY WITH ENDOBRONCHIAL ULTRASOUND;  Surgeon: Melrose Nakayama, MD;  Location: [facility] OR;  Service: Thoracic;  Laterality: N/A;    Family History  Problem Relation Age of Onset  . Early death Mother   . Heart disease Mother   . Early death Father   . Heart disease Father     MI  . Cancer Sister     BREAST  . Heart disease Sister     MI 2013    Social History History  Substance Use Topics  . Smoking status: Current Every Day Smoker -- 0.25 packs/day for 49 years    Types: Cigarettes  . Smokeless tobacco: Never Used  . Alcohol Use: Yes     Comment: SOCIAL    Allergies  Allergen Reactions  . Niacin And Related Rash    Current Outpatient Prescriptions  Medication Sig Dispense Refill  . aspirin EC 81 MG tablet Take 162 mg by mouth at bedtime.       . clonazePAM (KLONOPIN) 0.5 MG tablet Take 0.5 mg by mouth at bedtime as needed for anxiety.      Marland Kitchen estradiol (ESTRACE) 0.5 MG tablet Take 0.5 mg by mouth daily.      Marland Kitchen ibuprofen (ADVIL,MOTRIN) 100 MG tablet Take 200 mg by mouth every 6 (six) hours as needed for fever.       . levothyroxine (SYNTHROID, LEVOTHROID) 125 MCG tablet Take 250 mcg by mouth daily before breakfast.       . medroxyPROGESTERone (PROVERA) 2.5 MG tablet Take 2.5 mg by mouth daily.      Marland Kitchen HYDROcodone-homatropine (HYCODAN) 5-1.5 MG/5ML syrup Take 5 mLs by mouth every 6 (six) hours as needed for cough.  120 mL  0  . prochlorperazine (COMPAZINE) 10 MG tablet Take 1 tablet (10 mg total) by mouth every 6 (six) hours as needed for nausea or vomiting.  30 tablet  0   Current Facility-Administered Medications  Medication Dose Route Frequency Provider Last Rate Last Dose  . cloNIDine (CATAPRES) tablet 0.2 mg  0.2 mg Oral Once Curt Bears, MD        Review of Systems  Constitutional: positive for fatigue Eyes: negative Ears, nose, mouth, throat, and face: negative Respiratory: positive for cough, dyspnea on exertion and wheezing Cardiovascular: negative Gastrointestinal: negative Genitourinary:negative Integument/breast: negative  Hematologic/lymphatic: negative Musculoskeletal:negative Neurological: negative Behavioral/Psych: negative Endocrine: negative Allergic/Immunologic: negative  Physical Exam  UJW:JXBJY, healthy, no distress, well nourished and well developed SKIN: skin color, texture, turgor are normal, no rashes or significant lesions HEAD: Normocephalic, No masses, lesions, tenderness or abnormalities EYES: Right eye blindness EARS: External ears normal, Canals clear OROPHARYNX:no exudate, no erythema and lips, buccal mucosa, and tongue normal  NECK: supple, no adenopathy, no JVD LYMPH:  no palpable lymphadenopathy, no hepatosplenomegaly BREAST:not examined LUNGS: clear to auscultation , and palpation HEART: regular rate & rhythm, no murmurs and no gallops ABDOMEN:abdomen soft, non-tender, normal bowel sounds and no masses or organomegaly BACK: Back symmetric, no curvature., No CVA tenderness EXTREMITIES:no joint deformities, effusion, or inflammation, no edema, no  skin discoloration  NEURO: alert & oriented x 3 with fluent speech, no focal motor/sensory deficits  PERFORMANCE STATUS: ECOG 1  LABORATORY DATA: Lab Results  Component Value Date   WBC 11.4* 04/20/2014   HGB 11.9 04/20/2014   HCT 36.4 04/20/2014   MCV 90.0 04/20/2014   PLT 407* 04/20/2014      Chemistry      Component Value Date/Time   NA 142 04/20/2014 1447   NA 141 04/05/2014 1336   K 4.3 04/20/2014 1447   K 4.5 04/05/2014 1336   CL 104 04/05/2014 1336   CO2 23 04/20/2014 1447   CO2 20 04/05/2014 1336   BUN 9.6 04/20/2014 1447   BUN 12 04/05/2014 1336   CREATININE 0.7 04/20/2014 1447   CREATININE 0.46* 04/05/2014 1336      Component Value Date/Time   CALCIUM 9.6 04/20/2014 1447   CALCIUM 9.8 04/05/2014 1336   ALKPHOS 173* 04/20/2014 1447   ALKPHOS 197* 04/05/2014 1336   AST 15 04/20/2014 1447   AST 18 04/05/2014 1336   ALT 17 04/20/2014 1447   ALT 33 04/05/2014 1336   BILITOT 0.25 04/20/2014 1447   BILITOT 0.3 04/05/2014 1336       RADIOGRAPHIC STUDIES: Dg Chest 2 View Within Previous 72 Hours.  Films Obtained On Friday Are Acceptable For Monday And Tuesday Cases  04/05/2014   CLINICAL DATA:  Known right basilar infiltrate  EXAM: CHEST  2 VIEW  COMPARISON:  03/21/2014  FINDINGS: Cardiac shadow is within normal limits. The lungs are well aerated bilaterally. Persistent right basilar infiltrate is noted within the lower lobe posteriorly. Bilateral breast implants are again seen.  IMPRESSION: No significant change in right lower lobe pneumonia.   Electronically Signed   By: Inez Catalina M.D.   On: 04/05/2014 16:26   Ct Chest W Contrast  03/24/2014   CLINICAL DATA:  Cough, right shoulder pain, right side pain, history of thyroid cancer  EXAM: CT CHEST WITH CONTRAST  TECHNIQUE: Multidetector CT imaging of the chest was performed during intravenous contrast administration.  CONTRAST:  51mL OMNIPAQUE IOHEXOL 300 MG/ML  SOLN  COMPARISON:  Chest radiograph 10/19/2013 and 10/25/2007  FINDINGS: The left lung is  clear. There are no pleural or pericardial effusions. Thyroid gland not identified. Thoracic inlet normal. Bilateral breast implants identified. No acute musculoskeletal findings ; osseous thorax is intact.  Left lung is clear except for tiny focus of scarring or peripheral atelectasis left apex. On the right, there is extensive lower lobe consolidation. There is densely consolidated lung in the central to lateral aspect of the basilar segments of the lower lobe. Medially there is some aeration with multiple rounded foci of ill-defined opacity measuring around 1-1.5 cm each.  Contrast opacification is suboptimal. There  does appear to be excess soft tissue in the right hilum. In the mid to inferior right hilum there is subtle low-attenuation soft tissue. There is a suggestion of a mass in the inferior right hilum measuring 18 x 18 x 12 mm. This could represent adenopathy but it may also represent consolidated lung. Bronchogenic carcinoma is another possibility. There does appear to be mass effect on the branches of the airway supplying the right base. There is evidence of extrinsic mass effect on lower lobe bronchi, with no endobronchial lesions identified. There is a pretracheal lymph node measuring 8 mm. There is an 8 mm precarinal lymph node.  Images through the upper abdomen partially visualized the adrenal glands and there is a left adrenal nodule measuring at least 14 mm.  IMPRESSION: 1. Extensive right lower lobe consolidation. Densely consolidated right lower lobe with rounded areas of opacity medially, most consistent with pneumonia or postobstructive pneumonitis. Radiographic followup after appropriate therapy with followup CT scan should be performed to ensure resolution, particularly of the more nodular foci of opacity to ensure that these do not represent mass. 2. Soft tissue in the right hilum, which may represent a combination of at adenopathy and consolidated lung the, but there is a masslike focus in  the inferior right hilum seen best on axial images 31 through 35. There is evidence of extrinsic mass effect on lower lobe bronchi which may be causing airway obstruction. Bronchogenic carcinoma not excluded. 3. Borderline mediastinal lymph nodes 4. Partially visualized left adrenal nodule measuring at least 14 mm. CT abdomen pelvis, or adrenal protocol MRI, could be considered to further evaluate for the possibility of a significant left adrenal lesion.   Electronically Signed   By: Skipper Cliche M.D.   On: 03/24/2014 08:37   Mr Jeri Cos [place] Contrast  04/19/2014   CLINICAL DATA:  68 year old female newly diagnosed right lung mass with headache. Staging. Subsequent encounter.  EXAM: MRI HEAD WITHOUT AND WITH CONTRAST  TECHNIQUE: Multiplanar, multiecho pulse sequences of the brain and surrounding structures were obtained without and with intravenous contrast.  CONTRAST:  38mL MULTIHANCE GADOBENATE DIMEGLUMINE 529 MG/ML IV SOLN  COMPARISON:  PET-CT 04/19/2014.  FINDINGS: No abnormal enhancement identified. No midline shift, mass effect, or evidence of intracranial mass lesion.  Cerebral volume is within normal limits for age. No restricted diffusion to suggest acute infarction. No midline ventriculomegaly, extra-axial collection or acute intracranial hemorrhage. Cervicomedullary junction and pituitary are within normal limits. Negative visualized cervical spine. Major intracranial vascular flow voids are preserved.  Patchy bilateral nonspecific cerebral white matter T2 and FLAIR hyperintensity, moderate for age. No cortical encephalomalacia. More mild patchy T2 hyperintensity in the pons. Visible internal auditory structures appear normal.  Trace right mastoid fluid. Small right nasopharyngeal retention cyst. Other Visualized paranasal sinuses and mastoids are clear. Postoperative changes to the right globe. Bone marrow signal within normal limits. Visualized scalp soft tissues are within normal limits.   IMPRESSION: 1.  No acute or metastatic intracranial abnormality. 2. Moderate for age nonspecific signal changes, most commonly due to chronic small vessel disease.   Electronically Signed   By: Lars Pinks M.D.   On: 04/19/2014 12:40   Nm Pet Image Initial (pi) Skull Base To Thigh  04/19/2014   CLINICAL DATA:  Initial treatment strategy for right lower lobe small cell lung cancer.  EXAM: NUCLEAR MEDICINE PET SKULL BASE TO THIGH  TECHNIQUE: 7.0 mCi F-18 FDG was injected intravenously. Full-ring PET imaging was performed from the skull base  to thigh after the radiotracer. CT data was obtained and used for attenuation correction and anatomic localization.  FASTING BLOOD GLUCOSE:  Value: 92 mg/dl  COMPARISON:  Chest CT 03/23/2014.  FINDINGS: NECK  No areas of abnormal hypermetabolism.  CHEST  Ill-defined right upper lobe nodular density measures 6 mm and a S.U.V. max of 2.3 on image 35. Subtly apparent on image 27 axial and image 34 sagittal of the prior CT.  Hypermetabolic mediastinal adenopathy. A lower right paratracheal node measures 1.1 cm and a S.U.V. max of 3.6 on image 70. Subcarinal/azygoesophageal recess node measures 9 mm and a S.U.V. max of 4.4 on image 74.  Right hilar hypermetabolism which corresponds to mild adenopathy. This measures 1.7 cm and a S.U.V. max of 4.2 on image 77.  The presumed primary is centered lateral to the inferior aspect of the bronchus intermedius and origin of the right lower lobe bronchi. 1.8 x 1.8 cm and a S.U.V. max of 11.6 on image 83.  More peripheral right lower lobe patchy airspace disease is slightly decreased and corresponds to low-level hypermetabolism. Favored to represent postobstructive pneumonitis.  ABDOMEN/PELVIS  No areas of abnormal hypermetabolism.  SKELETON  No abnormal marrow activity.  CT IMAGES PERFORMED FOR ATTENUATION CORRECTION  No cervical adenopathy. Carotid atherosclerosis. Aortic and branch vessel atherosclerosis. Multivessel coronary artery  atherosclerosis. Mild cardiomegaly. Bilateral breast implants. New patchy left lower lobe airspace disease is suspicious for infection. This is mild. No well-defined adrenal nodule. Convex right lumbar spine curvature.  IMPRESSION: 1. Primary bronchogenic carcinoma centered lateral to the origin of the right lower lobe bronchus. Mediastinal and right hilar hypermetabolic nodes, highly suspicious for metastatic disease. A hypermetabolic right upper lobe pulmonary nodule is suspicious for pulmonary metastasis versus a synchronous primary bronchogenic carcinoma. 2. Improved right lower lobe aeration. Persistent patchy hypermetabolic opacity which is likely related to postobstructive pneumonitis. 3. No evidence of extrathoracic metastasis. 4.  Atherosclerosis, including within the coronary arteries. 5. New mild left lower lobe airspace disease which is suspicious for infection.   Electronically Signed   By: Abigail Miyamoto M.D.   On: 04/19/2014 10:40    ASSESSMENT: This is a very pleasant 68 years old white female recently diagnosed with stage IIIA (T3, N2, M0) non-small cell lung cancer, squamous cell carcinoma diagnosed in September of 2015 presenting with central right lower lobe lung mass with postobstructive pneumonitis and mediastinal lymph nodes, was also a suspicious small nodule in the right upper lobe.   PLAN: I had a lengthy discussion with the patient and her husband today about her current disease stage, prognosis and treatment options. The patient has good performance status and I recommended for her a course of concurrent chemoradiation with weekly carboplatin for AUC of 2 and paclitaxel 45 mg/M2 for a total of 6-7 weeks depending on the final dose of radiation. I discussed with the patient adverse effect of the chemotherapy including but not limited to alopecia, myelosuppression, nausea and vomiting, peripheral neuropathy, liver or renal dysfunction. She would like to proceed with treatment as  planned I expect the patient to start the first cycle of this treatment on 05/01/2014. I will arrange for the patient to have a chemotherapy education class before starting the first dose of her treatment. I will call her pharmacy was prescription for Compazine 10 mg by mouth every 6 hours as needed for nausea. For the dry cough, I will start the patient on Hycodan 5 ML by mouth every 6 hours as needed for cough. The  patient will see Dr. Pablo Ledger later today for evaluation and discussion of the radiotherapy option. For smoke cessation, I strongly encouraged the patient to quit smoking and offered her smoke cessation program. She will also see the lung navigator for counseling regarding her smoke cessation. For hypertension, the patient is very anxious today and that contributed to her elevated blood pressure. I strongly encouraged her consult with her primary care physician regarding adjusting her blood pressure medication. I also started the patient on clonidine 0.2 mg by mouth x1 today. She would come back for followup visit in 3 weeks for evaluation and management any adverse effect of her treatment The patient was advised to call immediately if she has any concerning symptoms in the interval. The patient was seen during her multidisciplinary thoracic oncology clinic today by medical oncology, radiation oncology, thoracic navigator and physical therapist. The patient voices understanding of current disease status and treatment options and is in agreement with the current care plan.  All questions were answered. The patient knows to call the clinic with any problems, questions or concerns. We can certainly see the patient much sooner if necessary.  Thank you so much for allowing me to participate in the care of [place]. I will continue to follow up the patient with you and assist in her care.  I spent 55 minutes counseling the patient face to face. The total time spent in the appointment was  80 minutes.  Disclaimer: This note was dictated with voice recognition software. Similar sounding words can inadvertently be transcribed and may not be corrected upon review.   Shardai Star K. 04/20/2014, 4:43 PM

## 2014-04-20 NOTE — Telephone Encounter (Signed)
Pt confirmed labs/ov per 09/24 POF, sent msg to add chemo, gave pt AVS         KJ

## 2014-04-20 NOTE — Telephone Encounter (Signed)
Called left vm message regarding appt today.

## 2014-04-20 NOTE — Progress Notes (Signed)
Radiation Oncology         [phone]) [phone] ________________________________  Initial outpatient Consultation - Date: 04/20/2014   Name: [name] MRN: [id number]   DOB: Mar 18, 1946  REFERRING PHYSICIAN: Asencion Noble, MD  DIAGNOSIS: Stage III NSCLC  HISTORY OF PRESENT ILLNESS::[name] is a 69 y.o. female  Who presented with a cough.  A chest xray showed a right lower lobe infiltrate and a follow up CT of the chest showed a lesion in the right bronchus intermedius with a consolidated right lower lobe. She admits to weight loss, chills and night sweats. She had a bronchoscopy and biopsy by Dr. Roxan Hockey which showed an obstructing lesion in the right lower and upper lobes which he debrided. 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 A PET scan showed an infrahilar mass with an SUV of 11.6 measuring 1.8 cm. A node was noted as well as paratracheal and subcarinal nodes. An MRI of the brain was negative for metastatic disease. She presents today complaining of fatigue and cough. She is accompanied by her husband. She is cutting back on her smoking from 1.5 packs to 0.25 packs per day.   PREVIOUS RADIATION THERAPY: No  PAST MEDICAL HISTORY:  has a past medical history of Thyroid disease; thyroid cancer; Status post bilateral breast implants; Consolidation lung (03/21/14); Hypertension; Shortness of breath; Hypothyroidism; Mitral valve prolapse; Anxiety; Arthritis; Cancer; Fibromyalgia; and Complication of anesthesia.    PAST SURGICAL HISTORY: Past Surgical History  Procedure Laterality Date  . Thyroidectomy    . Cataract extraction Right   . Breast enhancement surgery    . Retinal detachment surgery    . Eye surgery    . Pars plana vitrectomy w/ repair of macular hole    . Back surgery      times 2  . Tonsillectomy    . Cervical cone biopsy    . Video bronchoscopy with endobronchial ultrasound N/A 04/07/2014    Procedure: VIDEO BRONCHOSCOPY WITH ENDOBRONCHIAL ULTRASOUND;   Surgeon: Melrose Nakayama, MD;  Location: [facility];  Service: Thoracic;  Laterality: N/A;    FAMILY HISTORY:  Family History  Problem Relation Age of Onset  . Early death Mother   . Heart disease Mother   . Early death Father   . Heart disease Father     MI  . Cancer Sister     BREAST  . Heart disease Sister     MI 2013    SOCIAL HISTORY:  History  Substance Use Topics  . Smoking status: Current Every Day Smoker -- 0.25 packs/day for 49 years    Types: Cigarettes  . Smokeless tobacco: Never Used  . Alcohol Use: Yes     Comment: SOCIAL    ALLERGIES: Niacin and related  MEDICATIONS:  Current Outpatient Prescriptions  Medication Sig Dispense Refill  . aspirin EC 81 MG tablet Take 162 mg by mouth at bedtime.       . clonazePAM (KLONOPIN) 0.5 MG tablet Take 0.5 mg by mouth at bedtime as needed for anxiety.      Marland Kitchen estradiol (ESTRACE) 0.5 MG tablet Take 0.5 mg by mouth daily.      Marland Kitchen HYDROcodone-homatropine (HYCODAN) 5-1.5 MG/5ML syrup Take 5 mLs by mouth every 6 (six) hours as needed for cough.  120 mL  0  . ibuprofen (ADVIL,MOTRIN) 100 MG tablet Take 200 mg by mouth every 6 (six) hours as needed for fever.      . levothyroxine (SYNTHROID, LEVOTHROID) 125 MCG tablet Take 250  mcg by mouth daily before breakfast.       . medroxyPROGESTERone (PROVERA) 2.5 MG tablet Take 2.5 mg by mouth daily.      . prochlorperazine (COMPAZINE) 10 MG tablet Take 1 tablet (10 mg total) by mouth every 6 (six) hours as needed for nausea or vomiting.  30 tablet  0   No current facility-administered medications for this encounter.    REVIEW OF SYSTEMS:  A 15 point review of systems is documented in the electronic medical record. This was obtained by the nursing staff. However, I reviewed this with the patient to discuss relevant findings and make appropriate changes.  Pertinent items are noted in HPI.  PHYSICAL EXAM:  Filed Vitals:   04/20/14 1517  BP: 192/97  Pulse: 97  Temp: 98.3 F (36.8 C)    Resp: 16  .145 lb 12.8 oz (66.134 kg). Pleasant female. Coughs infrequently. No respiratory distress. Alert and oriented.   LABORATORY DATA:  Lab Results  Component Value Date   WBC 11.4* 04/20/2014   HGB 11.9 04/20/2014   HCT 36.4 04/20/2014   MCV 90.0 04/20/2014   PLT 407* 04/20/2014   Lab Results  Component Value Date   NA 142 04/20/2014   K 4.3 04/20/2014   CL 104 04/05/2014   CO2 23 04/20/2014   Lab Results  Component Value Date   ALT 17 04/20/2014   AST 15 04/20/2014   ALKPHOS 173* 04/20/2014   BILITOT 0.25 04/20/2014     RADIOGRAPHY: Dg Chest 2 View Within Previous 72 Hours.  Films Obtained On Friday Are Acceptable For Monday And Tuesday Cases  04/05/2014   CLINICAL DATA:  Known right basilar infiltrate  EXAM: CHEST  2 VIEW  COMPARISON:  03/21/2014  FINDINGS: Cardiac shadow is within normal limits. The lungs are well aerated bilaterally. Persistent right basilar infiltrate is noted within the lower lobe posteriorly. Bilateral breast implants are again seen.  IMPRESSION: No significant change in right lower lobe pneumonia.   Electronically Signed   By: Inez Catalina M.D.   On: 04/05/2014 16:26   Ct Chest W Contrast  03/24/2014   CLINICAL DATA:  Cough, right shoulder pain, right side pain, history of thyroid cancer  EXAM: CT CHEST WITH CONTRAST  TECHNIQUE: Multidetector CT imaging of the chest was performed during intravenous contrast administration.  CONTRAST:  65mL OMNIPAQUE IOHEXOL 300 MG/ML  SOLN  COMPARISON:  Chest radiograph 10/19/2013 and 10/25/2007  FINDINGS: The left lung is clear. There are no pleural or pericardial effusions. Thyroid gland not identified. Thoracic inlet normal. Bilateral breast implants identified. No acute musculoskeletal findings ; osseous thorax is intact.  Left lung is clear except for tiny focus of scarring or peripheral atelectasis left apex. On the right, there is extensive lower lobe consolidation. There is densely consolidated lung in the central to lateral  aspect of the basilar segments of the lower lobe. Medially there is some aeration with multiple rounded foci of ill-defined opacity measuring around 1-1.5 cm each.  Contrast opacification is suboptimal. There does appear to be excess soft tissue in the right hilum. In the mid to inferior right hilum there is subtle low-attenuation soft tissue. There is a suggestion of a mass in the inferior right hilum measuring 18 x 18 x 12 mm. This could represent adenopathy but it may also represent consolidated lung. Bronchogenic carcinoma is another possibility. There does appear to be mass effect on the branches of the airway supplying the right base. There is evidence of extrinsic  mass effect on lower lobe bronchi, with no endobronchial lesions identified. There is a pretracheal lymph node measuring 8 mm. There is an 8 mm precarinal lymph node.  Images through the upper abdomen partially visualized the adrenal glands and there is a left adrenal nodule measuring at least 14 mm.  IMPRESSION: 1. Extensive right lower lobe consolidation. Densely consolidated right lower lobe with rounded areas of opacity medially, most consistent with pneumonia or postobstructive pneumonitis. Radiographic followup after appropriate therapy with followup CT scan should be performed to ensure resolution, particularly of the more nodular foci of opacity to ensure that these do not represent mass. 2. Soft tissue in the right hilum, which may represent a combination of at adenopathy and consolidated lung the, but there is a masslike focus in the inferior right hilum seen best on axial images 31 through 35. There is evidence of extrinsic mass effect on lower lobe bronchi which may be causing airway obstruction. Bronchogenic carcinoma not excluded. 3. Borderline mediastinal lymph nodes 4. Partially visualized left adrenal nodule measuring at least 14 mm. CT abdomen pelvis, or adrenal protocol MRI, could be considered to further evaluate for the  possibility of a significant left adrenal lesion.   Electronically Signed   By: Skipper Cliche M.D.   On: 03/24/2014 08:37   Mr Jeri Cos OM Contrast  04/19/2014   CLINICAL DATA:  68 year old female newly diagnosed right lung mass with headache. Staging. Subsequent encounter.  EXAM: MRI HEAD WITHOUT AND WITH CONTRAST  TECHNIQUE: Multiplanar, multiecho pulse sequences of the brain and surrounding structures were obtained without and with intravenous contrast.  CONTRAST:  27mL MULTIHANCE GADOBENATE DIMEGLUMINE 529 MG/ML IV SOLN  COMPARISON:  PET-CT 04/19/2014.  FINDINGS: No abnormal enhancement identified. No midline shift, mass effect, or evidence of intracranial mass lesion.  Cerebral volume is within normal limits for age. No restricted diffusion to suggest acute infarction. No midline ventriculomegaly, extra-axial collection or acute intracranial hemorrhage. Cervicomedullary junction and pituitary are within normal limits. Negative visualized cervical spine. Major intracranial vascular flow voids are preserved.  Patchy bilateral nonspecific cerebral white matter T2 and FLAIR hyperintensity, moderate for age. No cortical encephalomalacia. More mild patchy T2 hyperintensity in the pons. Visible internal auditory structures appear normal.  Trace right mastoid fluid. Small right nasopharyngeal retention cyst. Other Visualized paranasal sinuses and mastoids are clear. Postoperative changes to the right globe. Bone marrow signal within normal limits. Visualized scalp soft tissues are within normal limits.  IMPRESSION: 1.  No acute or metastatic intracranial abnormality. 2. Moderate for age nonspecific signal changes, most commonly due to chronic small vessel disease.   Electronically Signed   By: Lars Pinks M.D.   On: 04/19/2014 12:40   Nm Pet Image Initial (pi) Skull Base To Thigh  04/19/2014   CLINICAL DATA:  Initial treatment strategy for right lower lobe small cell lung cancer.  EXAM: NUCLEAR MEDICINE PET SKULL  BASE TO THIGH  TECHNIQUE: 7.0 mCi F-18 FDG was injected intravenously. Full-ring PET imaging was performed from the skull base to thigh after the radiotracer. CT data was obtained and used for attenuation correction and anatomic localization.  FASTING BLOOD GLUCOSE:  Value: 92 mg/dl  COMPARISON:  Chest CT 03/23/2014.  FINDINGS: NECK  No areas of abnormal hypermetabolism.  CHEST  Ill-defined right upper lobe nodular density measures 6 mm and a S.U.V. max of 2.3 on image 35. Subtly apparent on image 27 axial and image 34 sagittal of the prior CT.  Hypermetabolic mediastinal adenopathy. A lower  right paratracheal node measures 1.1 cm and a S.U.V. max of 3.6 on image 70. Subcarinal/azygoesophageal recess node measures 9 mm and a S.U.V. max of 4.4 on image 74.  Right hilar hypermetabolism which corresponds to mild adenopathy. This measures 1.7 cm and a S.U.V. max of 4.2 on image 77.  The presumed primary is centered lateral to the inferior aspect of the bronchus intermedius and origin of the right lower lobe bronchi. 1.8 x 1.8 cm and a S.U.V. max of 11.6 on image 83.  More peripheral right lower lobe patchy airspace disease is slightly decreased and corresponds to low-level hypermetabolism. Favored to represent postobstructive pneumonitis.  ABDOMEN/PELVIS  No areas of abnormal hypermetabolism.  SKELETON  No abnormal marrow activity.  CT IMAGES PERFORMED FOR ATTENUATION CORRECTION  No cervical adenopathy. Carotid atherosclerosis. Aortic and branch vessel atherosclerosis. Multivessel coronary artery atherosclerosis. Mild cardiomegaly. Bilateral breast implants. New patchy left lower lobe airspace disease is suspicious for infection. This is mild. No well-defined adrenal nodule. Convex right lumbar spine curvature.  IMPRESSION: 1. Primary bronchogenic carcinoma centered lateral to the origin of the right lower lobe bronchus. Mediastinal and right hilar hypermetabolic nodes, highly suspicious for metastatic disease. A  hypermetabolic right upper lobe pulmonary nodule is suspicious for pulmonary metastasis versus a synchronous primary bronchogenic carcinoma. 2. Improved right lower lobe aeration. Persistent patchy hypermetabolic opacity which is likely related to postobstructive pneumonitis. 3. No evidence of extrathoracic metastasis. 4.  Atherosclerosis, including within the coronary arteries. 5. New mild left lower lobe airspace disease which is suspicious for infection.   Electronically Signed   By: Abigail Miyamoto M.D.   On: 04/19/2014 10:40   IMPRESSION: Stage III NSCLC   PLAN: We discussed the role of radiation in the curative treatment of NSCLC. We discussed her prognosis and our likelihood of success given her age and lack of significant medical comorbidities. She has had several friends recently die of cancer and she feels she understands what she is facing. We discussed the process of simulation and the placement of tattoos. We discussed 6 weeks of treatment as an outpatient. We discussed possible side effects including skin redness, fatigue, and esophagitis. I will schedule her for simulation next week and plan to start concurrent chemoRT the week of October 5th.   I spent 20 minutes  face to face with the patient and more than 50% of that time was spent in counseling and/or coordination of care.   ------------------------------------------------  Thea Silversmith, MD

## 2014-04-20 NOTE — Patient Instructions (Signed)
Smoking Cessation, Tips for Success  If you are ready to quit smoking, congratulations! You have chosen to help yourself be healthier. Cigarettes bring nicotine, tar, carbon monoxide, and other irritants into your body. Your lungs, heart, and blood vessels will be able to work better without these poisons. There are many different ways to quit smoking. Nicotine gum, nicotine patches, a nicotine inhaler, or nicotine nasal spray can help with physical craving. Hypnosis, support groups, and medicines help break the habit of smoking.  WHAT THINGS CAN I DO TO MAKE QUITTING EASIER?   Here are some tips to help you quit for good:  · Pick a date when you will quit smoking completely. Tell all of your friends and family about your plan to quit on that date.  · Do not try to slowly cut down on the number of cigarettes you are smoking. Pick a quit date and quit smoking completely starting on that day.  · Throw away all cigarettes.    · Clean and remove all ashtrays from your home, work, and car.  · On a card, write down your reasons for quitting. Carry the card with you and read it when you get the urge to smoke.  · Cleanse your body of nicotine. Drink enough water and fluids to keep your urine clear or pale yellow. Do this after quitting to flush the nicotine from your body.  · Learn to predict your moods. Do not let a bad situation be your excuse to have a cigarette. Some situations in your life might tempt you into wanting a cigarette.  · Never have "just one" cigarette. It leads to wanting another and another. Remind yourself of your decision to quit.  · Change habits associated with smoking. If you smoked while driving or when feeling stressed, try other activities to replace smoking. Stand up when drinking your coffee. Brush your teeth after eating. Sit in a different chair when you read the paper. Avoid alcohol while trying to quit, and try to drink fewer caffeinated beverages. Alcohol and caffeine may urge you to  smoke.  · Avoid foods and drinks that can trigger a desire to smoke, such as sugary or spicy foods and alcohol.  · Ask people who smoke not to smoke around you.  · Have something planned to do right after eating or having a cup of coffee. For example, plan to take a walk or exercise.  · Try a relaxation exercise to calm you down and decrease your stress. Remember, you may be tense and nervous for the first 2 weeks after you quit, but this will pass.  · Find new activities to keep your hands busy. Play with a pen, coin, or rubber band. Doodle or draw things on paper.  · Brush your teeth right after eating. This will help cut down on the craving for the taste of tobacco after meals. You can also try mouthwash.    · Use oral substitutes in place of cigarettes. Try using lemon drops, carrots, cinnamon sticks, or chewing gum. Keep them handy so they are available when you have the urge to smoke.  · When you have the urge to smoke, try deep breathing.  · Designate your home as a nonsmoking area.  · If you are a heavy smoker, ask your health care provider about a prescription for nicotine chewing gum. It can ease your withdrawal from nicotine.  · Reward yourself. Set aside the cigarette money you save and buy yourself something nice.  · Look for   support from others. Join a support group or smoking cessation program. Ask someone at home or at work to help you with your plan to quit smoking.  · Always ask yourself, "Do I need this cigarette or is this just a reflex?" Tell yourself, "Today, I choose not to smoke," or "I do not want to smoke." You are reminding yourself of your decision to quit.  · Do not replace cigarette smoking with electronic cigarettes (commonly called e-cigarettes). The safety of e-cigarettes is unknown, and some may contain harmful chemicals.  · If you relapse, do not give up! Plan ahead and think about what you will do the next time you get the urge to smoke.  HOW WILL I FEEL WHEN I QUIT SMOKING?  You  may have symptoms of withdrawal because your body is used to nicotine (the addictive substance in cigarettes). You may crave cigarettes, be irritable, feel very hungry, cough often, get headaches, or have difficulty concentrating. The withdrawal symptoms are only temporary. They are strongest when you first quit but will go away within 10-14 days. When withdrawal symptoms occur, stay in control. Think about your reasons for quitting. Remind yourself that these are signs that your body is healing and getting used to being without cigarettes. Remember that withdrawal symptoms are easier to treat than the major diseases that smoking can cause.   Even after the withdrawal is over, expect periodic urges to smoke. However, these cravings are generally short lived and will go away whether you smoke or not. Do not smoke!  WHAT RESOURCES ARE AVAILABLE TO HELP ME QUIT SMOKING?  Your health care provider can direct you to community resources or hospitals for support, which may include:  · Group support.  · Education.  · Hypnosis.  · Therapy.  Document Released: 04/11/2004 Document Revised: 11/28/2013 Document Reviewed: 12/30/2012  Exit[employer]® Patient Information ©2015 [employer]. This information is not intended to replace advice given to you by your health care provider. Make sure you discuss any questions you have with your health care provider.

## 2014-04-21 ENCOUNTER — Telehealth: Payer: Self-pay | Admitting: *Deleted

## 2014-04-21 ENCOUNTER — Other Ambulatory Visit: Payer: Medicare Other

## 2014-04-21 ENCOUNTER — Ambulatory Visit: Payer: Medicare Other | Admitting: Internal Medicine

## 2014-04-21 NOTE — Telephone Encounter (Signed)
Per staff message and POF I have scheduled appts. Advised scheduler of appts. JMW  

## 2014-04-24 ENCOUNTER — Telehealth: Payer: Self-pay | Admitting: *Deleted

## 2014-04-24 ENCOUNTER — Other Ambulatory Visit: Payer: Self-pay | Admitting: *Deleted

## 2014-04-24 DIAGNOSIS — C3491 Malignant neoplasm of unspecified part of right bronchus or lung: Secondary | ICD-10-CM

## 2014-04-24 NOTE — Telephone Encounter (Signed)
Spoke with patient today.  She has family concerns with her husband.  She feels as though he may need to speak to Education officer, [employer].  I will inform lauren of needs.

## 2014-04-25 ENCOUNTER — Ambulatory Visit
Admission: RE | Admit: 2014-04-25 | Discharge: 2014-04-25 | Disposition: A | Discharge status: Home / Self Care | Payer: Medicare Other | Source: Ambulatory Visit | Attending: Radiation Oncology | Admitting: Radiation Oncology

## 2014-04-25 ENCOUNTER — Encounter: Payer: Self-pay | Admitting: *Deleted

## 2014-04-25 DIAGNOSIS — Z510 Encounter for antineoplastic radiation therapy: Secondary | ICD-10-CM | POA: Insufficient documentation

## 2014-04-25 DIAGNOSIS — C3491 Malignant neoplasm of unspecified part of right bronchus or lung: Secondary | ICD-10-CM

## 2014-04-25 DIAGNOSIS — C3490 Malignant neoplasm of unspecified part of unspecified bronchus or lung: Secondary | ICD-10-CM | POA: Diagnosis not present

## 2014-04-25 NOTE — Progress Notes (Signed)
[facility] Radiation Oncology Simulation and Treatment Planning Note   Name: Liticia Gasior MRN: [id number]  Date: 04/25/2014  DOB: [birth year]  Status: outpatient    DIAGNOSIS: There were no encounter diagnoses.    SIDE: right   CONSENT VERIFIED: yes   SET UP AND IMMOBILIZATION: Patient is setup supine with arms in a wing board.   NARRATIVE: The patient was brought to the [facility].  Identity was confirmed.  All relevant records and images related to the planned course of therapy were reviewed.  Then, the patient was positioned in a stable reproducible clinical set-up for radiation therapy.  CT images were obtained.  Skin markings were placed.  The CT images were loaded into the planning software where the target and avoidance structures were contoured.  The radiation prescription was entered and confirmed.   TREATMENT PLANNING NOTE:  Treatment planning then occurred. I have requested 3D simulation with [facility] of the spinal cord, total lungs and gross tumor volume. I have also requested mlcs and an isodose plan.   Special treatment procedure will be performed as Arbie Cookey Fehrenbach will be receiving concurrent chemotherapy.   I have ordered a consult with the dietician for monitoring.  I will also be verifying that weekly lab values are appropriate.

## 2014-04-25 NOTE — Progress Notes (Signed)
Clinical Social Work received referral from Norton Blizzard, thoracic navigator, for assessment and support. CSW attempted to contact patient and  left voicemail requesting patient return CSW phone call.  Polo Riley, MSW, LCSW, OSW-C Clinical Social Worker [facility] [phone]

## 2014-04-27 ENCOUNTER — Encounter: Payer: Self-pay | Admitting: *Deleted

## 2014-04-27 ENCOUNTER — Other Ambulatory Visit: Payer: Medicare Other

## 2014-04-28 DIAGNOSIS — Z510 Encounter for antineoplastic radiation therapy: Secondary | ICD-10-CM | POA: Insufficient documentation

## 2014-04-28 DIAGNOSIS — C3491 Malignant neoplasm of unspecified part of right bronchus or lung: Secondary | ICD-10-CM | POA: Diagnosis not present

## 2014-05-01 ENCOUNTER — Encounter: Payer: Self-pay | Admitting: *Deleted

## 2014-05-01 ENCOUNTER — Other Ambulatory Visit (HOSPITAL_BASED_OUTPATIENT_CLINIC_OR_DEPARTMENT_OTHER): Payer: Medicare Other

## 2014-05-01 ENCOUNTER — Ambulatory Visit (HOSPITAL_BASED_OUTPATIENT_CLINIC_OR_DEPARTMENT_OTHER): Payer: Medicare Other

## 2014-05-01 ENCOUNTER — Ambulatory Visit
Admission: RE | Admit: 2014-05-01 | Discharge: 2014-05-01 | Disposition: A | Discharge status: Home / Self Care | Payer: Medicare Other | Source: Ambulatory Visit | Attending: Radiation Oncology | Admitting: Radiation Oncology

## 2014-05-01 VITALS — BP 122/69 | HR 75 | Temp 98.5°F | Resp 18

## 2014-05-01 DIAGNOSIS — C3491 Malignant neoplasm of unspecified part of right bronchus or lung: Secondary | ICD-10-CM

## 2014-05-01 DIAGNOSIS — C3431 Malignant neoplasm of lower lobe, right bronchus or lung: Secondary | ICD-10-CM

## 2014-05-01 DIAGNOSIS — Z510 Encounter for antineoplastic radiation therapy: Secondary | ICD-10-CM | POA: Diagnosis not present

## 2014-05-01 DIAGNOSIS — Z5111 Encounter for antineoplastic chemotherapy: Secondary | ICD-10-CM

## 2014-05-01 LAB — CBC WITH DIFFERENTIAL/PLATELET
BASO%: 1.3 % (ref 0.0–2.0)
BASOS ABS: 0.1 10*3/uL (ref 0.0–0.1)
EOS%: 1.8 % (ref 0.0–7.0)
Eosinophils Absolute: 0.2 10*3/uL (ref 0.0–0.5)
HCT: 38.3 % (ref 34.8–46.6)
HEMOGLOBIN: 12.5 g/dL (ref 11.6–15.9)
LYMPH#: 3.1 10*3/uL (ref 0.9–3.3)
LYMPH%: 29.4 % (ref 14.0–49.7)
MCH: 29 pg (ref 25.1–34.0)
MCHC: 32.6 g/dL (ref 31.5–36.0)
MCV: 88.9 fL (ref 79.5–101.0)
MONO#: 1 10*3/uL — ABNORMAL HIGH (ref 0.1–0.9)
MONO%: 9.9 % (ref 0.0–14.0)
NEUT#: 6.1 10*3/uL (ref 1.5–6.5)
NEUT%: 57.6 % (ref 38.4–76.8)
Platelets: 453 10*3/uL — ABNORMAL HIGH (ref 145–400)
RBC: 4.3 10*6/uL (ref 3.70–5.45)
RDW: 14.4 % (ref 11.2–14.5)
WBC: 10.6 10*3/uL — ABNORMAL HIGH (ref 3.9–10.3)

## 2014-05-01 LAB — COMPREHENSIVE METABOLIC PANEL (CC13)
ALBUMIN: 3.4 g/dL — AB (ref 3.5–5.0)
ALT: 18 U/L (ref 0–55)
AST: 14 U/L (ref 5–34)
Alkaline Phosphatase: 159 U/L — ABNORMAL HIGH (ref 40–150)
Anion Gap: 10 mEq/L (ref 3–11)
BUN: 10 mg/dL (ref 7.0–26.0)
CHLORIDE: 107 meq/L (ref 98–109)
CO2: 22 mEq/L (ref 22–29)
Calcium: 9.9 mg/dL (ref 8.4–10.4)
Creatinine: 0.6 mg/dL (ref 0.6–1.1)
Glucose: 101 mg/dl (ref 70–140)
POTASSIUM: 4 meq/L (ref 3.5–5.1)
Sodium: 138 mEq/L (ref 136–145)
Total Bilirubin: 0.49 mg/dL (ref 0.20–1.20)
Total Protein: 7.5 g/dL (ref 6.4–8.3)

## 2014-05-01 MED ORDER — ONDANSETRON 16 MG/50ML IVPB (CHCC)
INTRAVENOUS | Status: AC
  Filled 2014-05-01: qty 16

## 2014-05-01 MED ORDER — DIPHENHYDRAMINE HCL 50 MG/ML IJ SOLN
50.0000 mg | Freq: Once | INTRAMUSCULAR | Status: AC
  Administered 2014-05-01: 50 mg via INTRAVENOUS

## 2014-05-01 MED ORDER — PACLITAXEL CHEMO INJECTION 300 MG/50ML
45.0000 mg/m2 | Freq: Once | INTRAVENOUS | Status: AC
  Administered 2014-05-01: 78 mg via INTRAVENOUS
  Filled 2014-05-01: qty 13

## 2014-05-01 MED ORDER — FAMOTIDINE IN NACL 20-0.9 MG/50ML-% IV SOLN
20.0000 mg | Freq: Once | INTRAVENOUS | Status: AC
  Administered 2014-05-01: 20 mg via INTRAVENOUS

## 2014-05-01 MED ORDER — ONDANSETRON 16 MG/50ML IVPB (CHCC)
16.0000 mg | Freq: Once | INTRAVENOUS | Status: AC
  Administered 2014-05-01: 16 mg via INTRAVENOUS

## 2014-05-01 MED ORDER — DEXAMETHASONE SODIUM PHOSPHATE 20 MG/5ML IJ SOLN
INTRAMUSCULAR | Status: AC
  Filled 2014-05-01: qty 5

## 2014-05-01 MED ORDER — FAMOTIDINE IN NACL 20-0.9 MG/50ML-% IV SOLN
INTRAVENOUS | Status: AC
  Filled 2014-05-01: qty 50

## 2014-05-01 MED ORDER — SODIUM CHLORIDE 0.9 % IV SOLN
Freq: Once | INTRAVENOUS | Status: AC
  Administered 2014-05-01: 11:00:00 via INTRAVENOUS

## 2014-05-01 MED ORDER — DEXAMETHASONE SODIUM PHOSPHATE 20 MG/5ML IJ SOLN
20.0000 mg | Freq: Once | INTRAMUSCULAR | Status: AC
  Administered 2014-05-01: 20 mg via INTRAVENOUS

## 2014-05-01 MED ORDER — DIPHENHYDRAMINE HCL 50 MG/ML IJ SOLN
INTRAMUSCULAR | Status: AC
  Filled 2014-05-01: qty 1

## 2014-05-01 MED ORDER — SODIUM CHLORIDE 0.9 % IV SOLN
167.0000 mg | Freq: Once | INTRAVENOUS | Status: AC
  Administered 2014-05-01: 170 mg via INTRAVENOUS
  Filled 2014-05-01: qty 17

## 2014-05-01 NOTE — Patient Instructions (Addendum)
Carboplatin injection What is this medicine? CARBOPLATIN (KAR boe pla tin) is a chemotherapy drug. It targets fast dividing cells, like cancer cells, and causes these cells to die. This medicine is used to treat ovarian cancer and many other cancers. This medicine may be used for other purposes; ask your health care provider or pharmacist if you have questions. COMMON BRAND NAME(S): Paraplatin What should I tell my health care provider before I take this medicine? They need to know if you have any of these conditions: -blood disorders -hearing problems -kidney disease -recent or ongoing radiation therapy -an unusual or allergic reaction to carboplatin, cisplatin, other chemotherapy, other medicines, foods, dyes, or preservatives -pregnant or trying to get pregnant -breast-feeding How should I use this medicine? This drug is usually given as an infusion into a vein. It is administered in a hospital or clinic by a specially trained health care professional. Talk to your pediatrician regarding the use of this medicine in children. Special care may be needed. Overdosage: If you think you have taken too much of this medicine contact a poison control center or emergency room at once. NOTE: This medicine is only for you. Do not share this medicine with others. What if I miss a dose? It is important not to miss a dose. Call your doctor or health care professional if you are unable to keep an appointment. What may interact with this medicine? -medicines for seizures -medicines to increase blood counts like filgrastim, pegfilgrastim, sargramostim -some antibiotics like amikacin, gentamicin, neomycin, streptomycin, tobramycin -vaccines Talk to your doctor or health care professional before taking any of these medicines: -acetaminophen -aspirin -ibuprofen -ketoprofen -naproxen This list may not describe all possible interactions. Give your health care provider a list of all the medicines, herbs,  non-prescription drugs, or dietary supplements you use. Also tell them if you smoke, drink alcohol, or use illegal drugs. Some items may interact with your medicine. What should I watch for while using this medicine? Your condition will be monitored carefully while you are receiving this medicine. You will need important blood work done while you are taking this medicine. This drug may make you feel generally unwell. This is not uncommon, as chemotherapy can affect healthy cells as well as cancer cells. Report any side effects. Continue your course of treatment even though you feel ill unless your doctor tells you to stop. In some cases, you may be given additional medicines to help with side effects. Follow all directions for their use. Call your doctor or health care professional for advice if you get a fever, chills or sore throat, or other symptoms of a cold or flu. Do not treat yourself. This drug decreases your body's ability to fight infections. Try to avoid being around people who are sick. This medicine may increase your risk to bruise or bleed. Call your doctor or health care professional if you notice any unusual bleeding. Be careful brushing and flossing your teeth or using a toothpick because you may get an infection or bleed more easily. If you have any dental work done, tell your dentist you are receiving this medicine. Avoid taking products that contain aspirin, acetaminophen, ibuprofen, naproxen, or ketoprofen unless instructed by your doctor. These medicines may hide a fever. Do not become pregnant while taking this medicine. Women should inform their doctor if they wish to become pregnant or think they might be pregnant. There is a potential for serious side effects to an unborn child. Talk to your health care professional or  pharmacist for more information. Do not breast-feed an infant while taking this medicine. What side effects may I notice from receiving this medicine? Side effects  that you should report to your doctor or health care professional as soon as possible: -allergic reactions like skin rash, itching or hives, swelling of the face, lips, or tongue -signs of infection - fever or chills, cough, sore throat, pain or difficulty passing urine -signs of decreased platelets or bleeding - bruising, pinpoint red spots on the skin, black, tarry stools, nosebleeds -signs of decreased red blood cells - unusually weak or tired, fainting spells, lightheadedness -breathing problems -changes in hearing -changes in vision -chest pain -high blood pressure -low blood counts - This drug may decrease the number of white blood cells, red blood cells and platelets. You may be at increased risk for infections and bleeding. -nausea and vomiting -pain, swelling, redness or irritation at the injection site -pain, tingling, numbness in the hands or feet -problems with balance, talking, walking -trouble passing urine or change in the amount of urine Side effects that usually do not require medical attention (report to your doctor or health care professional if they continue or are bothersome): -hair loss -loss of appetite -metallic taste in the mouth or changes in taste This list may not describe all possible side effects. Call your doctor for medical advice about side effects. You may report side effects to FDA at 1-800-FDA-1088. Where should I keep my medicine? This drug is given in a hospital or clinic and will not be stored at home. NOTE: This sheet is a summary. It may not cover all possible information. If you have questions about this medicine, talk to your doctor, pharmacist, or health care provider.  2015, Elsevier/Gold Standard. (2007-10-19 14:38:05)  Paclitaxel injection What is this medicine? PACLITAXEL (PAK li TAX el) is a chemotherapy drug. It targets fast dividing cells, like cancer cells, and causes these cells to die. This medicine is used to treat ovarian cancer,  breast cancer, and other cancers. This medicine may be used for other purposes; ask your health care provider or pharmacist if you have questions. COMMON BRAND NAME(S): Onxol, Taxol What should I tell my health care provider before I take this medicine? They need to know if you have any of these conditions: -blood disorders -irregular heartbeat -infection (especially a virus infection such as chickenpox, cold sores, or herpes) -liver disease -previous or ongoing radiation therapy -an unusual or allergic reaction to paclitaxel, alcohol, polyoxyethylated castor oil, other chemotherapy agents, other medicines, foods, dyes, or preservatives -pregnant or trying to get pregnant -breast-feeding How should I use this medicine? This drug is given as an infusion into a vein. It is administered in a hospital or clinic by a specially trained health care professional. Talk to your pediatrician regarding the use of this medicine in children. Special care may be needed. Overdosage: If you think you have taken too much of this medicine contact a poison control center or emergency room at once. NOTE: This medicine is only for you. Do not share this medicine with others. What if I miss a dose? It is important not to miss your dose. Call your doctor or health care professional if you are unable to keep an appointment. What may interact with this medicine? Do not take this medicine with any of the following medications: -disulfiram -metronidazole This medicine may also interact with the following medications: -cyclosporine -diazepam -ketoconazole -medicines to increase blood counts like filgrastim, pegfilgrastim, sargramostim -other chemotherapy drugs like  cisplatin, doxorubicin, epirubicin, etoposide, teniposide, vincristine -quinidine -testosterone -vaccines -verapamil Talk to your doctor or health care professional before taking any of these  medicines: -acetaminophen -aspirin -ibuprofen -ketoprofen -naproxen This list may not describe all possible interactions. Give your health care provider a list of all the medicines, herbs, non-prescription drugs, or dietary supplements you use. Also tell them if you smoke, drink alcohol, or use illegal drugs. Some items may interact with your medicine. What should I watch for while using this medicine? Your condition will be monitored carefully while you are receiving this medicine. You will need important blood work done while you are taking this medicine. This drug may make you feel generally unwell. This is not uncommon, as chemotherapy can affect healthy cells as well as cancer cells. Report any side effects. Continue your course of treatment even though you feel ill unless your doctor tells you to stop. In some cases, you may be given additional medicines to help with side effects. Follow all directions for their use. Call your doctor or health care professional for advice if you get a fever, chills or sore throat, or other symptoms of a cold or flu. Do not treat yourself. This drug decreases your body's ability to fight infections. Try to avoid being around people who are sick. This medicine may increase your risk to bruise or bleed. Call your doctor or health care professional if you notice any unusual bleeding. Be careful brushing and flossing your teeth or using a toothpick because you may get an infection or bleed more easily. If you have any dental work done, tell your dentist you are receiving this medicine. Avoid taking products that contain aspirin, acetaminophen, ibuprofen, naproxen, or ketoprofen unless instructed by your doctor. These medicines may hide a fever. Do not become pregnant while taking this medicine. Women should inform their doctor if they wish to become pregnant or think they might be pregnant. There is a potential for serious side effects to an unborn child. Talk to  your health care professional or pharmacist for more information. Do not breast-feed an infant while taking this medicine. Men are advised not to father a child while receiving this medicine. What side effects may I notice from receiving this medicine? Side effects that you should report to your doctor or health care professional as soon as possible: -allergic reactions like skin rash, itching or hives, swelling of the face, lips, or tongue -low blood counts - This drug may decrease the number of white blood cells, red blood cells and platelets. You may be at increased risk for infections and bleeding. -signs of infection - fever or chills, cough, sore throat, pain or difficulty passing urine -signs of decreased platelets or bleeding - bruising, pinpoint red spots on the skin, black, tarry stools, nosebleeds -signs of decreased red blood cells - unusually weak or tired, fainting spells, lightheadedness -breathing problems -chest pain -high or low blood pressure -mouth sores -nausea and vomiting -pain, swelling, redness or irritation at the injection site -pain, tingling, numbness in the hands or feet -slow or irregular heartbeat -swelling of the ankle, feet, hands Side effects that usually do not require medical attention (report to your doctor or health care professional if they continue or are bothersome): -bone pain -complete hair loss including hair on your head, underarms, pubic hair, eyebrows, and eyelashes -changes in the color of fingernails -diarrhea -loosening of the fingernails -loss of appetite -muscle or joint pain -red flush to skin -sweating This list may not describe all  possible side effects. Call your doctor for medical advice about side effects. You may report side effects to FDA at 1-800-FDA-1088. Where should I keep my medicine? This drug is given in a hospital or clinic and will not be stored at home. NOTE: This sheet is a summary. It may not cover all possible  information. If you have questions about this medicine, talk to your doctor, pharmacist, or health care provider.  2015, [employer]vier/Gold Standard. (2012-09-06 16:41:21)  [facility] Discharge Instructions for Patients Receiving Chemotherapy  Today you received the following chemotherapy agents Taxol/Carboplatin  To help prevent nausea and vomiting after your treatment, we encourage you to take your nausea medication Compazine   If you develop nausea and vomiting that is not controlled by your nausea medication, call the clinic.   BELOW ARE SYMPTOMS THAT SHOULD BE REPORTED IMMEDIATELY:  *FEVER GREATER THAN 100.5 F  *CHILLS WITH OR WITHOUT FEVER  NAUSEA AND VOMITING THAT IS NOT CONTROLLED WITH YOUR NAUSEA MEDICATION  *UNUSUAL SHORTNESS OF BREATH  *UNUSUAL BRUISING OR BLEEDING  TENDERNESS IN MOUTH AND THROAT WITH OR WITHOUT PRESENCE OF ULCERS  *URINARY PROBLEMS  *BOWEL PROBLEMS  UNUSUAL RASH Items with * indicate a potential emergency and should be followed up as soon as possible.  Feel free to call the clinic you have any questions or concerns. The clinic phone number is [phone].

## 2014-05-01 NOTE — [facility] Oncology Navigator Note (Unsigned)
I also spoke to patient today about smoking cessation.  She stated she has quit.  She did state she may try the nicotine patch if need.  I listened as she explained her plan to quit smoking.  I gave her some helpful tips to quit.

## 2014-05-01 NOTE — Progress Notes (Signed)
Spoke with pt today in chemo during her first chemotherapy.  She stated she was anxious to start.  Her daughter was sitting with her.  They had several questions which I was able to answer.  They asked to speak with Dr. Julien Nordmann.  I relayed message to Dr. Julien Nordmann.  He stated he would see patient in chemo room today.  Patient is aware

## 2014-05-02 ENCOUNTER — Other Ambulatory Visit: Payer: Self-pay | Admitting: Radiation Oncology

## 2014-05-02 ENCOUNTER — Ambulatory Visit
Admission: RE | Admit: 2014-05-02 | Discharge: 2014-05-02 | Disposition: A | Discharge status: Home / Self Care | Payer: Medicare Other | Source: Ambulatory Visit | Attending: Radiation Oncology | Admitting: Radiation Oncology

## 2014-05-02 ENCOUNTER — Encounter: Payer: Self-pay | Admitting: Radiation Oncology

## 2014-05-02 ENCOUNTER — Telehealth: Payer: Self-pay | Admitting: *Deleted

## 2014-05-02 VITALS — BP 128/73 | HR 93 | Temp 97.9°F | Resp 20 | Wt 146.8 lb

## 2014-05-02 DIAGNOSIS — C3491 Malignant neoplasm of unspecified part of right bronchus or lung: Secondary | ICD-10-CM

## 2014-05-02 DIAGNOSIS — Z510 Encounter for antineoplastic radiation therapy: Secondary | ICD-10-CM | POA: Diagnosis not present

## 2014-05-02 MED ORDER — BIAFINE EX EMUL
Freq: Every day | CUTANEOUS | Status: DC
  Administered 2014-05-02: 12:00:00 via TOPICAL

## 2014-05-02 NOTE — Progress Notes (Signed)
Weekly rad txs , 2/30 chest rll ,  no skin changes,  Radiation and you book , biafine cream givem, val malloy RN business card given, discussed side effects, fatigue, tswallowing difficulty  Sore throat, ,changes, skin irritation,pain, increase protein in diet, may need to go to soft diet,5-6 smaller meals , no sob, no nausea, dry cough, no pain, appetite good , all questions answered, 11:23 AM

## 2014-05-02 NOTE — Progress Notes (Signed)
Weekly Management Note Current Dose:4 Gy  Projected Dose:60 Gy   Narrative:  The patient presents for routine under treatment assessment.  CBCT/MVCT images/Port film x-rays were reviewed.  The chart was checked. Doing well. No complaints. RN education performed.   Physical Findings:  Unchanged  Vitals:  Filed Vitals:   05/02/14 1126  BP: 128/73  Pulse: 93  Temp: 97.9 F (36.6 C)  Resp: 20   Weight:  Wt Readings from Last 3 Encounters:  05/02/14 146 lb 12.8 oz (66.588 kg)  04/20/14 145 lb 12.8 oz (66.134 kg)  04/20/14 145 lb 12.8 oz (66.134 kg)   Lab Results  Component Value Date   WBC 10.6* 05/01/2014   HGB 12.5 05/01/2014   HCT 38.3 05/01/2014   MCV 88.9 05/01/2014   PLT 453* 05/01/2014   Lab Results  Component Value Date   CREATININE 0.6 05/01/2014   BUN 10.0 05/01/2014   NA 138 05/01/2014   K 4.0 05/01/2014   CL 104 04/05/2014   CO2 22 05/01/2014     Impression:  The patient is tolerating radiation.  Plan:  Continue treatment as planned. Start biafene.

## 2014-05-02 NOTE — Telephone Encounter (Signed)
Message copied by Cherylynn Ridges on Tue May 02, 2014  2:53 PM ------      Message from: Cherylynn Ridges      Created: Mon May 01, 2014  5:45 PM      Regarding: Chemotherapy F/U       Dr. Julien Nordmann, 1st carboplatin/Taxol  [phone].  RT at 11:00 am.  Lives in [place]. ------

## 2014-05-02 NOTE — Telephone Encounter (Signed)
Called [name] for chemotherapy F/U.  Patient says she feels tired but is doing well.  Did not sleep as well last night as she normally does.  Denies n/v.  Denies any new side effects or symptoms.  Bowel and bladder is functioning well.  Eating and drinking well and I instructed to drink 64 oz minimum daily or at least the day before, of and after treatment.  Denies questions at this time and encouraged to call if needed.  Reviewed how to call after hours in the case of an emergency.

## 2014-05-03 ENCOUNTER — Ambulatory Visit
Admission: RE | Admit: 2014-05-03 | Discharge: 2014-05-03 | Disposition: A | Discharge status: Home / Self Care | Payer: Medicare Other | Source: Ambulatory Visit | Attending: Radiation Oncology | Admitting: Radiation Oncology

## 2014-05-03 DIAGNOSIS — Z510 Encounter for antineoplastic radiation therapy: Secondary | ICD-10-CM | POA: Diagnosis not present

## 2014-05-04 ENCOUNTER — Ambulatory Visit
Admission: RE | Admit: 2014-05-04 | Discharge: 2014-05-04 | Disposition: A | Discharge status: Home / Self Care | Payer: Medicare Other | Source: Ambulatory Visit | Attending: Radiation Oncology | Admitting: Radiation Oncology

## 2014-05-04 DIAGNOSIS — Z510 Encounter for antineoplastic radiation therapy: Secondary | ICD-10-CM | POA: Diagnosis not present

## 2014-05-05 ENCOUNTER — Ambulatory Visit
Admission: RE | Admit: 2014-05-05 | Discharge: 2014-05-05 | Disposition: A | Discharge status: Home / Self Care | Payer: Medicare Other | Source: Ambulatory Visit | Attending: Radiation Oncology | Admitting: Radiation Oncology

## 2014-05-05 DIAGNOSIS — Z510 Encounter for antineoplastic radiation therapy: Secondary | ICD-10-CM | POA: Diagnosis not present

## 2014-05-08 ENCOUNTER — Ambulatory Visit (HOSPITAL_BASED_OUTPATIENT_CLINIC_OR_DEPARTMENT_OTHER): Payer: Medicare Other

## 2014-05-08 ENCOUNTER — Ambulatory Visit
Admission: RE | Admit: 2014-05-08 | Discharge: 2014-05-08 | Disposition: A | Discharge status: Home / Self Care | Payer: Medicare Other | Source: Ambulatory Visit | Attending: Radiation Oncology | Admitting: Radiation Oncology

## 2014-05-08 ENCOUNTER — Encounter: Payer: Self-pay | Admitting: Physician Assistant

## 2014-05-08 ENCOUNTER — Other Ambulatory Visit (HOSPITAL_BASED_OUTPATIENT_CLINIC_OR_DEPARTMENT_OTHER): Payer: Medicare Other

## 2014-05-08 ENCOUNTER — Ambulatory Visit (HOSPITAL_BASED_OUTPATIENT_CLINIC_OR_DEPARTMENT_OTHER): Payer: Medicare Other | Admitting: Physician Assistant

## 2014-05-08 VITALS — HR 92

## 2014-05-08 VITALS — BP 147/79 | HR 116 | Temp 98.3°F | Resp 19 | Ht 68.0 in | Wt 143.2 lb

## 2014-05-08 DIAGNOSIS — C3491 Malignant neoplasm of unspecified part of right bronchus or lung: Secondary | ICD-10-CM

## 2014-05-08 DIAGNOSIS — Z5111 Encounter for antineoplastic chemotherapy: Secondary | ICD-10-CM

## 2014-05-08 DIAGNOSIS — C3431 Malignant neoplasm of lower lobe, right bronchus or lung: Secondary | ICD-10-CM

## 2014-05-08 DIAGNOSIS — Z510 Encounter for antineoplastic radiation therapy: Secondary | ICD-10-CM | POA: Diagnosis not present

## 2014-05-08 LAB — COMPREHENSIVE METABOLIC PANEL (CC13)
ALK PHOS: 160 U/L — AB (ref 40–150)
ALT: 25 U/L (ref 0–55)
AST: 19 U/L (ref 5–34)
Albumin: 3.6 g/dL (ref 3.5–5.0)
Anion Gap: 13 mEq/L — ABNORMAL HIGH (ref 3–11)
BUN: 11.9 mg/dL (ref 7.0–26.0)
CO2: 19 mEq/L — ABNORMAL LOW (ref 22–29)
CREATININE: 0.7 mg/dL (ref 0.6–1.1)
Calcium: 10.2 mg/dL (ref 8.4–10.4)
Chloride: 107 mEq/L (ref 98–109)
GLUCOSE: 99 mg/dL (ref 70–140)
Potassium: 3.9 mEq/L (ref 3.5–5.1)
Sodium: 139 mEq/L (ref 136–145)
Total Bilirubin: 0.52 mg/dL (ref 0.20–1.20)
Total Protein: 7.9 g/dL (ref 6.4–8.3)

## 2014-05-08 LAB — CBC WITH DIFFERENTIAL/PLATELET
BASO%: 1.3 % (ref 0.0–2.0)
BASOS ABS: 0.1 10*3/uL (ref 0.0–0.1)
EOS ABS: 0.2 10*3/uL (ref 0.0–0.5)
EOS%: 2.1 % (ref 0.0–7.0)
HCT: 42 % (ref 34.8–46.6)
HEMOGLOBIN: 13.6 g/dL (ref 11.6–15.9)
LYMPH%: 22.3 % (ref 14.0–49.7)
MCH: 29 pg (ref 25.1–34.0)
MCHC: 32.3 g/dL (ref 31.5–36.0)
MCV: 89.7 fL (ref 79.5–101.0)
MONO#: 0.9 10*3/uL (ref 0.1–0.9)
MONO%: 8.8 % (ref 0.0–14.0)
NEUT#: 6.6 10*3/uL — ABNORMAL HIGH (ref 1.5–6.5)
NEUT%: 65.5 % (ref 38.4–76.8)
Platelets: 401 10*3/uL — ABNORMAL HIGH (ref 145–400)
RBC: 4.68 10*6/uL (ref 3.70–5.45)
RDW: 14.1 % (ref 11.2–14.5)
WBC: 10.1 10*3/uL (ref 3.9–10.3)
lymph#: 2.3 10*3/uL (ref 0.9–3.3)

## 2014-05-08 MED ORDER — DIPHENHYDRAMINE HCL 50 MG/ML IJ SOLN
50.0000 mg | Freq: Once | INTRAMUSCULAR | Status: AC
  Administered 2014-05-08: 50 mg via INTRAVENOUS

## 2014-05-08 MED ORDER — FAMOTIDINE IN NACL 20-0.9 MG/50ML-% IV SOLN
20.0000 mg | Freq: Once | INTRAVENOUS | Status: AC
  Administered 2014-05-08: 20 mg via INTRAVENOUS

## 2014-05-08 MED ORDER — PACLITAXEL CHEMO INJECTION 300 MG/50ML
45.0000 mg/m2 | Freq: Once | INTRAVENOUS | Status: AC
  Administered 2014-05-08: 78 mg via INTRAVENOUS
  Filled 2014-05-08: qty 13

## 2014-05-08 MED ORDER — DIPHENHYDRAMINE HCL 50 MG/ML IJ SOLN
INTRAMUSCULAR | Status: AC
  Filled 2014-05-08: qty 1

## 2014-05-08 MED ORDER — DEXAMETHASONE SODIUM PHOSPHATE 20 MG/5ML IJ SOLN
20.0000 mg | Freq: Once | INTRAMUSCULAR | Status: AC
  Administered 2014-05-08: 20 mg via INTRAVENOUS

## 2014-05-08 MED ORDER — ONDANSETRON 16 MG/50ML IVPB (CHCC)
16.0000 mg | Freq: Once | INTRAVENOUS | Status: AC
  Administered 2014-05-08: 16 mg via INTRAVENOUS

## 2014-05-08 MED ORDER — FAMOTIDINE IN NACL 20-0.9 MG/50ML-% IV SOLN
INTRAVENOUS | Status: AC
  Filled 2014-05-08: qty 50

## 2014-05-08 MED ORDER — ONDANSETRON 16 MG/50ML IVPB (CHCC)
INTRAVENOUS | Status: AC
  Filled 2014-05-08: qty 16

## 2014-05-08 MED ORDER — SODIUM CHLORIDE 0.9 % IV SOLN
Freq: Once | INTRAVENOUS | Status: AC
  Administered 2014-05-08: 12:00:00 via INTRAVENOUS

## 2014-05-08 MED ORDER — PROCHLORPERAZINE MALEATE 10 MG PO TABS: 10.0000 mg | ORAL_TABLET | Freq: Four times a day (QID) | ORAL | Status: DC | PRN

## 2014-05-08 MED ORDER — DEXAMETHASONE SODIUM PHOSPHATE 20 MG/5ML IJ SOLN
INTRAMUSCULAR | Status: AC
  Filled 2014-05-08: qty 5

## 2014-05-08 MED ORDER — SODIUM CHLORIDE 0.9 % IV SOLN
170.0000 mg | Freq: Once | INTRAVENOUS | Status: AC
  Administered 2014-05-08: 170 mg via INTRAVENOUS
  Filled 2014-05-08: qty 17

## 2014-05-08 MED ORDER — HYDROCODONE-HOMATROPINE 5-1.5 MG/5ML PO SYRP: 5.0000 mL | ORAL_SOLUTION | Freq: Four times a day (QID) | ORAL | Status: DC | PRN

## 2014-05-08 NOTE — Patient Instructions (Addendum)
[facility] Discharge Instructions for Patients Receiving Chemotherapy  Today you received the following chemotherapy agents Taxol and Carboplatin.  To help prevent nausea and vomiting after your treatment, we encourage you to take your nausea medication as prescribed.   If you develop nausea and vomiting that is not controlled by your nausea medication, call the clinic.   BELOW ARE SYMPTOMS THAT SHOULD BE REPORTED IMMEDIATELY:  *FEVER GREATER THAN 100.5 F  *CHILLS WITH OR WITHOUT FEVER  NAUSEA AND VOMITING THAT IS NOT CONTROLLED WITH YOUR NAUSEA MEDICATION  *UNUSUAL SHORTNESS OF BREATH  *UNUSUAL BRUISING OR BLEEDING  TENDERNESS IN MOUTH AND THROAT WITH OR WITHOUT PRESENCE OF ULCERS  *URINARY PROBLEMS  *BOWEL PROBLEMS  UNUSUAL RASH Items with * indicate a potential emergency and should be followed up as soon as possible.  Feel free to call the clinic you have any questions or concerns. The clinic phone number is [phone].   IV Infiltration Sometimes fluid from your IV leaks under your skin. This is called an infiltration. HOME CARE  Put an ice pack on the puffy (swollen) area until the puffiness goes down. Place the ice pack on the swollen area 4 times a day for 10 minutes at a time.  Put ice in a plastic bag.  Place the towel between your skin and the bag.  Keep the swollen area above your chest as much as you can until the swelling goes down. Prop your arm up on at least 3 to 4 pillows.  Avoid tight fitting clothing, watches, or jewelry near the swollen area. GET HELP RIGHT AWAY IF:   The skin around the place where the needle was put in becomes darker and peels.  You have red streaks on your skin from the place where the needle was put in.  Yellowish white fluid (pus) comes out from the place where the needle was put in.  You have a temperature by mouth of 102 F (38.9 C). MAKE SURE YOU:  Understand these instructions.  Will watch  your condition.  Will get help right away if you are not doing well or get worse. Document Released: 04/22/2008 Document Revised: 10/06/2011 Document Reviewed: 05/02/2009 [facility] Patient Information 2015 [place], [place]. This information is not intended to replace advice given to you by your health care provider. Make sure you discuss any questions you have with your health care provider.

## 2014-05-08 NOTE — Progress Notes (Addendum)
No images are attached to the encounter. No scans are attached to the encounter. No scans are attached to the encounter. [place], MD [address] [place] [address]  DIAGNOSIS: Non-small cell cancer of right lung   Primary site: Lung   Staging method: AJCC 7th Edition   Clinical: Stage IIIA (T3, N2, M0) signed by Thea Silversmith, MD on 04/20/2014  5:57 PM   Summary: Stage IIIA (T3, N2, M0)  PRIOR THERAPY: None  CURRENT THERAPY: Concurrent chemoradiation with weekly carboplatin for an AUC of 2 and paclitaxel 45 mg per meter squared for a total of 6-7 weeks depending on the final dose radiation.  INTERVAL HISTORY: [name] 68 y.o. female returns for a scheduled regular symptom management visit for followup of her recently diagnosed as stage IIIa (T3, N2, M0) non-small cell lung cancer, squamous cell carcinoma diagnosed in September 2015. She is currently undergoing a course of concurrent chemoradiation, status post 1 week of treatment. She is accompanied by her sister today. Thus far she's tolerating her course of concurrent chemoradiation relatively well although she has begun to have some reflux/heartburn-type symptoms. She is scheduled to see Dr. Pablo Ledger tomorrow for her weekly evaluation and will mention this to her at that visit. She complains of difficulty sleeping and states that the Ambien is working for her. She states her primary care physician, Dr. Asencion Noble had her on Klonopin for both anxiety and also to help her S. She is wanting this medication refill. This allows to contact Dr. Ria Comment office. She would like a refill for her Hycodan cough syrup and would like to make sure that she has enough refills for her Compazine and she is having some nausea. She denies fever, chills, cough or hemoptysis. Shortness of breath with exertion. Denied diarrhea or constipation. She denied any night sweats or significant  weight loss. She presents to proceed with week 2 of her concurrent chemoradiation.  MEDICAL HISTORY: Past Medical History  Diagnosis Date  . Thyroid disease     hypothyroidism  . Hx of thyroid cancer   . Status post bilateral breast implants   . Consolidation lung 03/21/14    RLL PER CXR/CT  . Hypertension     just started,not on medication  . Shortness of breath     exertion  . Hypothyroidism   . Mitral valve prolapse   . Anxiety   . Arthritis   . Cancer     thyroid  . Fibromyalgia   . Complication of anesthesia     headaches,very agitated while waking  up    ALLERGIES:  is allergic to niacin and related.  MEDICATIONS:  Current Outpatient Prescriptions  Medication Sig Dispense Refill  . aspirin EC 81 MG tablet Take 162 mg by mouth at bedtime.       . clonazePAM (KLONOPIN) 0.5 MG tablet Take 0.5 mg by mouth at bedtime as needed for anxiety.      Marland Kitchen estradiol (ESTRACE) 0.5 MG tablet Take 0.5 mg by mouth daily.      Marland Kitchen HYDROcodone-homatropine (HYCODAN) 5-1.5 MG/5ML syrup Take 5 mLs by mouth every 6 (six) hours as needed for cough.  240 mL  0  . ibuprofen (ADVIL,MOTRIN) 100 MG tablet Take 200 mg by mouth every 6 (six) hours as needed for fever.      . levothyroxine (SYNTHROID, LEVOTHROID) 125 MCG tablet Take 250 mcg by mouth daily before breakfast.       .  medroxyPROGESTERone (PROVERA) 2.5 MG tablet Take 2.5 mg by mouth daily.      . nicotine (NICODERM CQ - DOSED IN MG/24 HOURS) 14 mg/24hr patch Place 14 mg onto the skin daily.      . prochlorperazine (COMPAZINE) 10 MG tablet Take 1 tablet (10 mg total) by mouth every 6 (six) hours as needed for nausea or vomiting.  30 tablet  2  . emollient (BIAFINE) cream Apply 1 application topically daily. Apply to chest area after rad tx when skin becomes irritated or itching       No current facility-administered medications for this visit.    SURGICAL HISTORY:  Past Surgical History  Procedure Laterality Date  . Thyroidectomy    .  Cataract extraction Right   . Breast enhancement surgery    . Retinal detachment surgery    . Eye surgery    . Pars plana vitrectomy w/ repair of macular hole    . Back surgery      times 2  . Tonsillectomy    . Cervical cone biopsy    . Video bronchoscopy with endobronchial ultrasound N/A 04/07/2014    Procedure: VIDEO BRONCHOSCOPY WITH ENDOBRONCHIAL ULTRASOUND;  Surgeon: Melrose Nakayama, MD;  Location: [facility];  Service: Thoracic;  Laterality: N/A;    REVIEW OF SYSTEMS:  Review of Systems  Constitutional: Negative for fever, chills, weight loss, malaise/fatigue and diaphoresis.  HENT: Negative for congestion, ear discharge, ear pain, hearing loss, nosebleeds, sore throat and tinnitus.   Eyes: Negative for blurred vision, double vision, photophobia, pain, discharge and redness.  Respiratory: Positive for cough and shortness of breath. Negative for hemoptysis, sputum production, wheezing and stridor.   Cardiovascular: Negative for chest pain, palpitations, orthopnea, claudication, leg swelling and PND.  Gastrointestinal: Positive for heartburn. Negative for nausea, vomiting, abdominal pain, diarrhea, constipation, blood in stool and melena.       Recent reflux/heartburn symptoms  Genitourinary: Negative.   Musculoskeletal: Negative.   Skin: Negative.   Neurological: Negative for dizziness, tingling, focal weakness, seizures, weakness and headaches.  Endo/Heme/Allergies: Does not bruise/bleed easily.  Psychiatric/Behavioral: Negative for depression. The patient has insomnia. The patient is not nervous/anxious.      PHYSICAL EXAMINATION: Physical Exam  Constitutional: She is oriented to person, place, and time and well-developed, well-nourished, and in no distress.  HENT:  Head: Normocephalic and atraumatic.  Mouth/Throat: Oropharynx is clear and moist.  Eyes: Pupils are equal, round, and reactive to light.  Neck: Normal range of motion. Neck supple. No JVD present. No tracheal  deviation present. No thyromegaly present.  Cardiovascular: Normal rate, regular rhythm, normal heart sounds and intact distal pulses.  Exam reveals no gallop and no friction rub.   No murmur heard. Pulmonary/Chest: Effort normal and breath sounds normal. No respiratory distress. She has no wheezes. She has no rales.  Abdominal: Soft. Bowel sounds are normal. She exhibits no distension and no mass. There is no tenderness.  Musculoskeletal: Normal range of motion. She exhibits no edema and no tenderness.  Lymphadenopathy:    She has no cervical adenopathy.  Neurological: She is alert and oriented to person, place, and time. She has normal reflexes. Gait normal.  Skin: Skin is warm and dry. No rash noted.    ECOG PERFORMANCE STATUS: 1 - Symptomatic but completely ambulatory  Blood pressure 147/79, pulse 116, temperature 98.3 F (36.8 C), temperature source Oral, resp. rate 19, height 5\' 8"  (1.727 m), weight 143 lb 3.2 oz (64.955 kg), SpO2 97.00%.  LABORATORY  DATA: Lab Results  Component Value Date   WBC 10.1 05/08/2014   HGB 13.6 05/08/2014   HCT 42.0 05/08/2014   MCV 89.7 05/08/2014   PLT 401* 05/08/2014      Chemistry      Component Value Date/Time   NA 139 05/08/2014 0952   NA 141 04/05/2014 1336   K 3.9 05/08/2014 0952   K 4.5 04/05/2014 1336   CL 104 04/05/2014 1336   CO2 19* 05/08/2014 0952   CO2 20 04/05/2014 1336   BUN 11.9 05/08/2014 0952   BUN 12 04/05/2014 1336   CREATININE 0.7 05/08/2014 0952   CREATININE 0.46* 04/05/2014 1336      Component Value Date/Time   CALCIUM 10.2 05/08/2014 0952   CALCIUM 9.8 04/05/2014 1336   ALKPHOS 160* 05/08/2014 0952   ALKPHOS 197* 04/05/2014 1336   AST 19 05/08/2014 0952   AST 18 04/05/2014 1336   ALT 25 05/08/2014 0952   ALT 33 04/05/2014 1336   BILITOT 0.52 05/08/2014 0952   BILITOT 0.3 04/05/2014 1336       RADIOGRAPHIC STUDIES:  Mr Jeri Cos Wo Contrast  04/19/2014   CLINICAL DATA:  68 year old female newly diagnosed right lung mass  with headache. Staging. Subsequent encounter.  EXAM: MRI HEAD WITHOUT AND WITH CONTRAST  TECHNIQUE: Multiplanar, multiecho pulse sequences of the brain and surrounding structures were obtained without and with intravenous contrast.  CONTRAST:  46mL MULTIHANCE GADOBENATE DIMEGLUMINE 529 MG/ML IV SOLN  COMPARISON:  PET-CT 04/19/2014.  FINDINGS: No abnormal enhancement identified. No midline shift, mass effect, or evidence of intracranial mass lesion.  Cerebral volume is within normal limits for age. No restricted diffusion to suggest acute infarction. No midline ventriculomegaly, extra-axial collection or acute intracranial hemorrhage. Cervicomedullary junction and pituitary are within normal limits. Negative visualized cervical spine. Major intracranial vascular flow voids are preserved.  Patchy bilateral nonspecific cerebral white matter T2 and FLAIR hyperintensity, moderate for age. No cortical encephalomalacia. More mild patchy T2 hyperintensity in the pons. Visible internal auditory structures appear normal.  Trace right mastoid fluid. Small right nasopharyngeal retention cyst. Other Visualized paranasal sinuses and mastoids are clear. Postoperative changes to the right globe. Bone marrow signal within normal limits. Visualized scalp soft tissues are within normal limits.  IMPRESSION: 1.  No acute or metastatic intracranial abnormality. 2. Moderate for age nonspecific signal changes, most commonly due to chronic small vessel disease.   Electronically Signed   By: Lars Pinks M.D.   On: 04/19/2014 12:40   Nm Pet Image Initial (pi) Skull Base To Thigh  04/19/2014   CLINICAL DATA:  Initial treatment strategy for right lower lobe small cell lung cancer.  EXAM: NUCLEAR MEDICINE PET SKULL BASE TO THIGH  TECHNIQUE: 7.0 mCi F-18 FDG was injected intravenously. Full-ring PET imaging was performed from the skull base to thigh after the radiotracer. CT data was obtained and used for attenuation correction and anatomic  localization.  FASTING BLOOD GLUCOSE:  Value: 92 mg/dl  COMPARISON:  Chest CT 03/23/2014.  FINDINGS: NECK  No areas of abnormal hypermetabolism.  CHEST  Ill-defined right upper lobe nodular density measures 6 mm and a S.U.V. max of 2.3 on image 35. Subtly apparent on image 27 axial and image 34 sagittal of the prior CT.  Hypermetabolic mediastinal adenopathy. A lower right paratracheal node measures 1.1 cm and a S.U.V. max of 3.6 on image 70. Subcarinal/azygoesophageal recess node measures 9 mm and a S.U.V. max of 4.4 on image 74.  Right hilar hypermetabolism  which corresponds to mild adenopathy. This measures 1.7 cm and a S.U.V. max of 4.2 on image 77.  The presumed primary is centered lateral to the inferior aspect of the bronchus intermedius and origin of the right lower lobe bronchi. 1.8 x 1.8 cm and a S.U.V. max of 11.6 on image 83.  More peripheral right lower lobe patchy airspace disease is slightly decreased and corresponds to low-level hypermetabolism. Favored to represent postobstructive pneumonitis.  ABDOMEN/PELVIS  No areas of abnormal hypermetabolism.  SKELETON  No abnormal marrow activity.  CT IMAGES PERFORMED FOR ATTENUATION CORRECTION  No cervical adenopathy. Carotid atherosclerosis. Aortic and branch vessel atherosclerosis. Multivessel coronary artery atherosclerosis. Mild cardiomegaly. Bilateral breast implants. New patchy left lower lobe airspace disease is suspicious for infection. This is mild. No well-defined adrenal nodule. Convex right lumbar spine curvature.  IMPRESSION: 1. Primary bronchogenic carcinoma centered lateral to the origin of the right lower lobe bronchus. Mediastinal and right hilar hypermetabolic nodes, highly suspicious for metastatic disease. A hypermetabolic right upper lobe pulmonary nodule is suspicious for pulmonary metastasis versus a synchronous primary bronchogenic carcinoma. 2. Improved right lower lobe aeration. Persistent patchy hypermetabolic opacity which is  likely related to postobstructive pneumonitis. 3. No evidence of extrathoracic metastasis. 4.  Atherosclerosis, including within the coronary arteries. 5. New mild left lower lobe airspace disease which is suspicious for infection.   Electronically Signed   By: Abigail Miyamoto M.D.   On: 04/19/2014 10:40     ASSESSMENT/PLAN:  No problem-specific assessment & plan notes found for this encounter.  patient is a pleasant 68 year old Caucasian female recently diagnosed with stage IIIa (T3, N2, M0) non-small cell lung cancer, squamous cell carcinoma diagnosed September 2015 presenting with central right lower lobe lung mass with postobstructive pneumonitis and mediastinal lymph nodes. There is also a suspicious small node in the right upper lobe. She's currently being treated a course of concurrent chemoradiation with weekly chemotherapy with carboplatin for AUC of 2 and paclitaxel 45 mg per meter squared for a total of 6-7 weeks depending on the final dose radiation. She is currently scheduled to complete her last fraction of radiation on 06/09/2014. Patient was discussed with also seen by Dr. Julien Nordmann. She will continue with her course of concurrent chemoradiation as scheduled. She was given a refill prescription for her Hycodan cough syrup and refill for her Compazine was sent to her pharmacy of record via E. scribe. Patient was advised to contact Dr. Ria Comment office regarding refill of her Klonopin. She'll followup in 2 weeks for another symptom management visit.  Awilda Metro E, PA-C 05/08/2014  All questions were answered. The patient knows to call the clinic with any problems, questions or concerns. We can certainly see the patient much sooner if necessary.  ADDENDUM: Hematology/Oncology Attending:  I had a face to face encounter with the patient today. I recommended her care plan. This is a very pleasant 68 years old white female recently diagnosed with a stage IIIa non-small cell lung cancer. She was  started on a course of concurrent chemoradiation with weekly carboplatin and paclitaxel is status post 1 cycle and she is tolerating her treatment well. The patient denied having any significant nausea or vomiting, no fever or chills. She denied having any significant odynophagia or dysphagia. I recommended for her to continue her current treatment as scheduled. She will start cycle #2 today. The patient would come back for followup visit in 2 weeks for evaluation and management any adverse effect of her treatment.. She was advised  to call immediately she has any concerning symptoms in the interval.  Disclaimer: This note was dictated with voice recognition software. Similar sounding words can inadvertently be transcribed and may be missed upon review. Eilleen Kempf., MD 05/08/2014

## 2014-05-08 NOTE — Patient Instructions (Signed)
Continue your course of concurrent chemoradiation as scheduled Followup in 2 weeks for another symptom management visit

## 2014-05-08 NOTE — Progress Notes (Signed)
Pt's sister came to alert RN that IV site was painful. Pt reported burning/ pinching pain 4/10 at site. Slight bruising noted above insertion site. No edema. No blood return noted. IV flushed, removed. Ice pack applied to site. Pt reported relief after IV DC'd. Restarted in L arm. Pt tolerated without difficulty.

## 2014-05-09 ENCOUNTER — Inpatient Hospital Stay: Payer: Medicare Other

## 2014-05-09 ENCOUNTER — Telehealth: Payer: Self-pay | Admitting: Internal Medicine

## 2014-05-09 ENCOUNTER — Ambulatory Visit
Admission: RE | Admit: 2014-05-09 | Discharge: 2014-05-09 | Disposition: A | Discharge status: Home / Self Care | Payer: Medicare Other | Source: Ambulatory Visit | Attending: Radiation Oncology | Admitting: Radiation Oncology

## 2014-05-09 VITALS — BP 146/87 | HR 102 | Temp 97.8°F | Wt 143.2 lb

## 2014-05-09 DIAGNOSIS — Z510 Encounter for antineoplastic radiation therapy: Secondary | ICD-10-CM | POA: Diagnosis not present

## 2014-05-09 DIAGNOSIS — C3491 Malignant neoplasm of unspecified part of right bronchus or lung: Secondary | ICD-10-CM

## 2014-05-09 MED ORDER — MAGIC MOUTHWASH W/LIDOCAINE: 5.0000 mL | Freq: Three times a day (TID) | ORAL | Status: DC | PRN

## 2014-05-09 MED ORDER — HYDROCODONE-ACETAMINOPHEN 7.5-325 MG/15ML PO SOLN
10.0000 mL | Freq: Four times a day (QID) | ORAL | Status: DC | PRN
Start: 2014-05-09 — End: 2014-05-09

## 2014-05-09 MED ORDER — MORPHINE SULFATE 15 MG PO TABS: ORAL_TABLET | ORAL | Status: DC

## 2014-05-09 NOTE — Telephone Encounter (Signed)
lvm for pt to pick sched ....advised on md visit on friday due to availability

## 2014-05-09 NOTE — Addendum Note (Signed)
Encounter addended by: Thea Silversmith, MD on: 05/09/2014  6:32 PM<BR>     Documentation filed: Charges VN, Flowsheet VN, Orders

## 2014-05-09 NOTE — Progress Notes (Signed)
Weekly assessment of radiation to right lower lung.Denies pain but states starting to have some throat and esophagus changes.Non-productive cough.Several questions regarding side effects answered.Reinforced how to take care of self.

## 2014-05-09 NOTE — Progress Notes (Signed)
Weekly Management Note Current Dose:14 Gy  Projected Dose:60 Gy   Narrative:  The patient presents for routine under treatment assessment.  CBCT/MVCT images/Port film x-rays were reviewed.  The chart was checked. Doing well.  Husband is s/p cardiac stents. Some dysphagia but managing right now with diet modification. Would like script.  Codeine like meds make her nauseous. She is taking hycodan right now very occasionally for cough but again it makes her nauseous. Has taken morphine in the past. Sister is with her.  Physical Findings:  Unchanged   Vitals:  Filed Vitals:   05/09/14 1009  BP: 146/87  Pulse: 102  Temp: 97.8 F (36.6 C)   Weight:  Wt Readings from Last 3 Encounters:  05/09/14 143 lb 3.2 oz (64.955 kg)  05/08/14 143 lb 3.2 oz (64.955 kg)  05/02/14 146 lb 12.8 oz (66.588 kg)   Lab Results  Component Value Date   WBC 10.1 05/08/2014   HGB 13.6 05/08/2014   HCT 42.0 05/08/2014   MCV 89.7 05/08/2014   PLT 401* 05/08/2014   Lab Results  Component Value Date   CREATININE 0.7 05/08/2014   BUN 11.9 05/08/2014   NA 139 05/08/2014   K 3.9 05/08/2014   CL 104 04/05/2014   CO2 19* 05/08/2014     Impression:  The patient is tolerating radiation.  Plan:  Continue treatment as planned. Script for MMW with lidocaine given and instructions. Also gave script for MSIR to start with 1/2 tab as needed for dysphagia.

## 2014-05-10 ENCOUNTER — Ambulatory Visit
Admission: RE | Admit: 2014-05-10 | Discharge: 2014-05-10 | Disposition: A | Discharge status: Home / Self Care | Payer: Medicare Other | Source: Ambulatory Visit | Attending: Radiation Oncology | Admitting: Radiation Oncology

## 2014-05-10 ENCOUNTER — Telehealth: Payer: Self-pay | Admitting: Internal Medicine

## 2014-05-10 ENCOUNTER — Ambulatory Visit: Payer: Medicare Other

## 2014-05-10 DIAGNOSIS — Z510 Encounter for antineoplastic radiation therapy: Secondary | ICD-10-CM | POA: Diagnosis not present

## 2014-05-10 NOTE — Telephone Encounter (Signed)
Pt stopped by and pick up a new sch for apts. Chief Strategy Officer

## 2014-05-10 NOTE — Progress Notes (Signed)
Kathe Becton, RN assessed pt's extravasation site and documented on Extravasation Record.

## 2014-05-11 ENCOUNTER — Ambulatory Visit
Admission: RE | Admit: 2014-05-11 | Discharge: 2014-05-11 | Disposition: A | Discharge status: Home / Self Care | Payer: Medicare Other | Source: Ambulatory Visit | Attending: Radiation Oncology | Admitting: Radiation Oncology

## 2014-05-11 DIAGNOSIS — Z510 Encounter for antineoplastic radiation therapy: Secondary | ICD-10-CM | POA: Diagnosis not present

## 2014-05-12 ENCOUNTER — Ambulatory Visit
Admission: RE | Admit: 2014-05-12 | Discharge: 2014-05-12 | Disposition: A | Discharge status: Home / Self Care | Payer: Medicare Other | Source: Ambulatory Visit | Attending: Radiation Oncology | Admitting: Radiation Oncology

## 2014-05-12 ENCOUNTER — Other Ambulatory Visit: Payer: Self-pay

## 2014-05-12 DIAGNOSIS — Z510 Encounter for antineoplastic radiation therapy: Secondary | ICD-10-CM | POA: Diagnosis not present

## 2014-05-15 ENCOUNTER — Ambulatory Visit (HOSPITAL_BASED_OUTPATIENT_CLINIC_OR_DEPARTMENT_OTHER): Payer: Medicare Other

## 2014-05-15 ENCOUNTER — Other Ambulatory Visit (HOSPITAL_BASED_OUTPATIENT_CLINIC_OR_DEPARTMENT_OTHER): Payer: Medicare Other | Admitting: Nurse Practitioner

## 2014-05-15 ENCOUNTER — Ambulatory Visit
Admission: RE | Admit: 2014-05-15 | Discharge: 2014-05-15 | Disposition: A | Discharge status: Home / Self Care | Payer: Medicare Other | Source: Ambulatory Visit | Attending: Radiation Oncology | Admitting: Radiation Oncology

## 2014-05-15 ENCOUNTER — Encounter: Payer: Self-pay | Admitting: Nurse Practitioner

## 2014-05-15 ENCOUNTER — Other Ambulatory Visit (HOSPITAL_BASED_OUTPATIENT_CLINIC_OR_DEPARTMENT_OTHER): Payer: Medicare Other

## 2014-05-15 ENCOUNTER — Ambulatory Visit (HOSPITAL_BASED_OUTPATIENT_CLINIC_OR_DEPARTMENT_OTHER): Payer: Medicare Other | Admitting: Nurse Practitioner

## 2014-05-15 VITALS — BP 114/72 | HR 72 | Temp 98.8°F

## 2014-05-15 DIAGNOSIS — C3491 Malignant neoplasm of unspecified part of right bronchus or lung: Secondary | ICD-10-CM

## 2014-05-15 DIAGNOSIS — Z510 Encounter for antineoplastic radiation therapy: Secondary | ICD-10-CM | POA: Diagnosis not present

## 2014-05-15 DIAGNOSIS — C3431 Malignant neoplasm of lower lobe, right bronchus or lung: Secondary | ICD-10-CM

## 2014-05-15 DIAGNOSIS — R21 Rash and other nonspecific skin eruption: Secondary | ICD-10-CM

## 2014-05-15 DIAGNOSIS — Z5111 Encounter for antineoplastic chemotherapy: Secondary | ICD-10-CM

## 2014-05-15 DIAGNOSIS — N898 Other specified noninflammatory disorders of vagina: Secondary | ICD-10-CM

## 2014-05-15 DIAGNOSIS — R300 Dysuria: Secondary | ICD-10-CM

## 2014-05-15 LAB — CBC WITH DIFFERENTIAL/PLATELET
BASO%: 0.8 % (ref 0.0–2.0)
Basophils Absolute: 0 10*3/uL (ref 0.0–0.1)
EOS ABS: 0.2 10*3/uL (ref 0.0–0.5)
EOS%: 3.8 % (ref 0.0–7.0)
HCT: 35.2 % (ref 34.8–46.6)
HGB: 11.5 g/dL — ABNORMAL LOW (ref 11.6–15.9)
LYMPH#: 1.1 10*3/uL (ref 0.9–3.3)
LYMPH%: 20.5 % (ref 14.0–49.7)
MCH: 29.3 pg (ref 25.1–34.0)
MCHC: 32.7 g/dL (ref 31.5–36.0)
MCV: 89.6 fL (ref 79.5–101.0)
MONO#: 0.8 10*3/uL (ref 0.1–0.9)
MONO%: 15.1 % — ABNORMAL HIGH (ref 0.0–14.0)
NEUT%: 59.8 % (ref 38.4–76.8)
NEUTROS ABS: 3.1 10*3/uL (ref 1.5–6.5)
Platelets: 354 10*3/uL (ref 145–400)
RBC: 3.93 10*6/uL (ref 3.70–5.45)
RDW: 13.8 % (ref 11.2–14.5)
WBC: 5.2 10*3/uL (ref 3.9–10.3)

## 2014-05-15 LAB — COMPREHENSIVE METABOLIC PANEL (CC13)
ALBUMIN: 3.2 g/dL — AB (ref 3.5–5.0)
ALT: 23 U/L (ref 0–55)
ANION GAP: 8 meq/L (ref 3–11)
AST: 16 U/L (ref 5–34)
Alkaline Phosphatase: 126 U/L (ref 40–150)
BUN: 8.5 mg/dL (ref 7.0–26.0)
CO2: 22 meq/L (ref 22–29)
Calcium: 9.7 mg/dL (ref 8.4–10.4)
Chloride: 107 mEq/L (ref 98–109)
Creatinine: 0.6 mg/dL (ref 0.6–1.1)
GLUCOSE: 89 mg/dL (ref 70–140)
POTASSIUM: 4.1 meq/L (ref 3.5–5.1)
SODIUM: 137 meq/L (ref 136–145)
TOTAL PROTEIN: 6.9 g/dL (ref 6.4–8.3)
Total Bilirubin: 0.37 mg/dL (ref 0.20–1.20)

## 2014-05-15 LAB — URINALYSIS, MICROSCOPIC - CHCC
Bilirubin (Urine): NEGATIVE
Blood: NEGATIVE
Glucose: NEGATIVE mg/dL
KETONES: NEGATIVE mg/dL
LEUKOCYTE ESTERASE: NEGATIVE
Nitrite: NEGATIVE
PH: 6 (ref 4.6–8.0)
PROTEIN: NEGATIVE mg/dL
RBC / HPF: NEGATIVE (ref 0–2)
Specific Gravity, Urine: 1.005 (ref 1.003–1.035)
Urobilinogen, UR: 0.2 mg/dL (ref 0.2–1)
WBC, UA: NEGATIVE (ref 0–2)

## 2014-05-15 MED ORDER — DEXAMETHASONE SODIUM PHOSPHATE 20 MG/5ML IJ SOLN
INTRAMUSCULAR | Status: AC
  Filled 2014-05-15: qty 5

## 2014-05-15 MED ORDER — FAMOTIDINE IN NACL 20-0.9 MG/50ML-% IV SOLN
INTRAVENOUS | Status: AC
  Filled 2014-05-15: qty 50

## 2014-05-15 MED ORDER — ONDANSETRON 16 MG/50ML IVPB (CHCC)
16.0000 mg | Freq: Once | INTRAVENOUS | Status: AC
  Administered 2014-05-15: 16 mg via INTRAVENOUS

## 2014-05-15 MED ORDER — DEXAMETHASONE SODIUM PHOSPHATE 20 MG/5ML IJ SOLN
20.0000 mg | Freq: Once | INTRAMUSCULAR | Status: AC
  Administered 2014-05-15: 20 mg via INTRAVENOUS

## 2014-05-15 MED ORDER — FAMOTIDINE IN NACL 20-0.9 MG/50ML-% IV SOLN
20.0000 mg | Freq: Once | INTRAVENOUS | Status: AC
  Administered 2014-05-15: 20 mg via INTRAVENOUS

## 2014-05-15 MED ORDER — SODIUM CHLORIDE 0.9 % IV SOLN
167.0000 mg | Freq: Once | INTRAVENOUS | Status: AC
  Administered 2014-05-15: 170 mg via INTRAVENOUS
  Filled 2014-05-15: qty 17

## 2014-05-15 MED ORDER — PACLITAXEL CHEMO INJECTION 300 MG/50ML
45.0000 mg/m2 | Freq: Once | INTRAVENOUS | Status: AC
  Administered 2014-05-15: 78 mg via INTRAVENOUS
  Filled 2014-05-15: qty 13

## 2014-05-15 MED ORDER — DIPHENHYDRAMINE HCL 50 MG/ML IJ SOLN
50.0000 mg | Freq: Once | INTRAMUSCULAR | Status: AC
  Administered 2014-05-15: 50 mg via INTRAVENOUS

## 2014-05-15 MED ORDER — SODIUM CHLORIDE 0.9 % IV SOLN
Freq: Once | INTRAVENOUS | Status: AC
  Administered 2014-05-15: 14:00:00 via INTRAVENOUS

## 2014-05-15 MED ORDER — ONDANSETRON 16 MG/50ML IVPB (CHCC)
INTRAVENOUS | Status: AC
  Filled 2014-05-15: qty 16

## 2014-05-15 MED ORDER — DIPHENHYDRAMINE HCL 50 MG/ML IJ SOLN
INTRAMUSCULAR | Status: AC
  Filled 2014-05-15: qty 1

## 2014-05-15 NOTE — Patient Instructions (Signed)
[facility] Discharge Instructions for Patients Receiving Chemotherapy  Today you received the following chemotherapy agents: taxol, carboplatin  To help prevent nausea and vomiting after your treatment, we encourage you to take your nausea medication as prescribed.    If you develop nausea and vomiting that is not controlled by your nausea medication, call the clinic.   BELOW ARE SYMPTOMS THAT SHOULD BE REPORTED IMMEDIATELY:  *FEVER GREATER THAN 100.5 F  *CHILLS WITH OR WITHOUT FEVER  NAUSEA AND VOMITING THAT IS NOT CONTROLLED WITH YOUR NAUSEA MEDICATION  *UNUSUAL SHORTNESS OF BREATH  *UNUSUAL BRUISING OR BLEEDING  TENDERNESS IN MOUTH AND THROAT WITH OR WITHOUT PRESENCE OF ULCERS  *URINARY PROBLEMS  *BOWEL PROBLEMS  UNUSUAL RASH Items with * indicate a potential emergency and should be followed up as soon as possible.  Feel free to call the clinic you have any questions or concerns. The clinic phone number is [phone].

## 2014-05-15 NOTE — Assessment & Plan Note (Addendum)
Patient is complaining of some mild vaginal irritation .  She states that she has no dysuria, frequency, hesitancy, or hematuria.  She also denies vaginal discharge or odor. She states that she noticed the vaginal irritation after wearing tight jeans.  Urinalysis obtained today was negative for UTI.  Pending urine culture. Patient was advised to wear loose fitting clothes; and she may try some Aquaphor for the vaginal irritation.

## 2014-05-15 NOTE — Assessment & Plan Note (Signed)
Patient will proceed today with cycle 3 of her carboplatin/paclitaxel chemotherapy regimen.  She will return for cycle 4 of the same regimen on 05/22/2014.  She'll also continue with radiation treatments on a Monday through Friday basis.

## 2014-05-15 NOTE — Progress Notes (Signed)
SYMPTOM MANAGEMENT CLINIC   HPI: [name] 68 y.o. female diagnosed with lung cancer.  Currently undergoing carboplatin/paclitaxel chemotherapy regimen; as well as radiation therapy.  Patient presented to the [facility] today to receive cycle 3 of her carboplatin/paclitaxel chemotherapy regimen.  She also continues with radiation treatments on a Monday through Friday basis.  Patient developed a rash to her right upper back just yesterday; is complaining of pruritus.  She states that her family has been trying to scratch her back for her since yesterday.  She denies any other swallowing difficulties or airway issues.  Also, patient is complaining of some vaginal irritation; and she states it is most likely do to her wearing some tight jeans.  She denies any dysuria, frequency, hesitancy, hematuria, vaginal discharge, or however.  She denies any recent fevers or chills as well.   Rash    CURRENT THERAPY: Upcoming Treatment Dates - LUNG Carboplatin / Paclitaxel + XRT q7d Days with orders from any treatment category:  05/22/2014      SCHEDULING COMMUNICATION      diphenhydrAMINE (BENADRYL) injection 50 mg      Dexamethasone Sodium Phosphate (DECADRON) injection 20 mg      ondansetron (ZOFRAN) IVPB 16 mg      famotidine (PEPCID) IVPB 20 mg      PACLitaxel (TAXOL) 78 mg in dextrose 5 % 250 mL chemo infusion (</= 80mg /m2)      CARBOplatin (PARAPLATIN) 170 mg in sodium chloride 0.9 % 100 mL chemo infusion      sodium chloride 0.9 % injection 10 mL      heparin lock flush 100 unit/mL      heparin lock flush 100 unit/mL      alteplase (CATHFLO ACTIVASE) injection 2 mg      sodium chloride 0.9 % injection 3 mL      Cold Pack 1 packet      diphenhydrAMINE (BENADRYL) injection 25 mg      famotidine (PEPCID) IVPB 20 mg      0.9 %  sodium chloride infusion      methylPREDNISolone sodium succinate (SOLU-MEDROL) 125 mg/2 mL injection 125 mg      EPINEPHrine (ADRENALIN) 0.1 MG/ML injection  0.25 mg      EPINEPHrine (ADRENALIN) 0.1 MG/ML injection 0.25 mg      EPINEPHrine (ADRENALIN) injection 0.5 mg      EPINEPHrine (ADRENALIN) injection 0.5 mg      diphenhydrAMINE (BENADRYL) injection 50 mg      albuterol (PROVENTIL) (2.5 MG/3ML) 0.083% nebulizer solution 2.5 mg      0.9 %  sodium chloride infusion      TREATMENT CONDITIONS 05/29/2014      SCHEDULING COMMUNICATION      diphenhydrAMINE (BENADRYL) injection 50 mg      Dexamethasone Sodium Phosphate (DECADRON) injection 20 mg      ondansetron (ZOFRAN) IVPB 16 mg      famotidine (PEPCID) IVPB 20 mg      PACLitaxel (TAXOL) 78 mg in dextrose 5 % 250 mL chemo infusion (</= 80mg /m2)      CARBOplatin (PARAPLATIN) 170 mg in sodium chloride 0.9 % 100 mL chemo infusion      sodium chloride 0.9 % injection 10 mL      heparin lock flush 100 unit/mL      heparin lock flush 100 unit/mL      alteplase (CATHFLO ACTIVASE) injection 2 mg      sodium chloride 0.9 % injection 3 mL  Cold Pack 1 packet      diphenhydrAMINE (BENADRYL) injection 25 mg      famotidine (PEPCID) IVPB 20 mg      0.9 %  sodium chloride infusion      methylPREDNISolone sodium succinate (SOLU-MEDROL) 125 mg/2 mL injection 125 mg      EPINEPHrine (ADRENALIN) 0.1 MG/ML injection 0.25 mg      EPINEPHrine (ADRENALIN) 0.1 MG/ML injection 0.25 mg      EPINEPHrine (ADRENALIN) injection 0.5 mg      EPINEPHrine (ADRENALIN) injection 0.5 mg      diphenhydrAMINE (BENADRYL) injection 50 mg      albuterol (PROVENTIL) (2.5 MG/3ML) 0.083% nebulizer solution 2.5 mg      0.9 %  sodium chloride infusion      TREATMENT CONDITIONS 06/05/2014      SCHEDULING COMMUNICATION      diphenhydrAMINE (BENADRYL) injection 50 mg      Dexamethasone Sodium Phosphate (DECADRON) injection 20 mg      ondansetron (ZOFRAN) IVPB 16 mg      famotidine (PEPCID) IVPB 20 mg      PACLitaxel (TAXOL) 78 mg in dextrose 5 % 250 mL chemo infusion (</= 80mg /m2)      CARBOplatin (PARAPLATIN) 170 mg in sodium  chloride 0.9 % 100 mL chemo infusion      sodium chloride 0.9 % injection 10 mL      heparin lock flush 100 unit/mL      heparin lock flush 100 unit/mL      alteplase (CATHFLO ACTIVASE) injection 2 mg      sodium chloride 0.9 % injection 3 mL      Cold Pack 1 packet      diphenhydrAMINE (BENADRYL) injection 25 mg      famotidine (PEPCID) IVPB 20 mg      0.9 %  sodium chloride infusion      methylPREDNISolone sodium succinate (SOLU-MEDROL) 125 mg/2 mL injection 125 mg      EPINEPHrine (ADRENALIN) 0.1 MG/ML injection 0.25 mg      EPINEPHrine (ADRENALIN) 0.1 MG/ML injection 0.25 mg      EPINEPHrine (ADRENALIN) injection 0.5 mg      EPINEPHrine (ADRENALIN) injection 0.5 mg      diphenhydrAMINE (BENADRYL) injection 50 mg      albuterol (PROVENTIL) (2.5 MG/3ML) 0.083% nebulizer solution 2.5 mg      0.9 %  sodium chloride infusion      TREATMENT CONDITIONS    Review of Systems  Skin: Positive for rash.    Past Medical History  Diagnosis Date  . Thyroid disease     hypothyroidism  . Hx of thyroid cancer   . Status post bilateral breast implants   . Consolidation lung 03/21/14    RLL PER CXR/CT  . Hypertension     just started,not on medication  . Shortness of breath     exertion  . Hypothyroidism   . Mitral valve prolapse   . Anxiety   . Arthritis   . Cancer     thyroid  . Fibromyalgia   . Complication of anesthesia     headaches,very agitated while waking  up    Past Surgical History  Procedure Laterality Date  . Thyroidectomy    . Cataract extraction Right   . Breast enhancement surgery    . Retinal detachment surgery    . Eye surgery    . Pars plana vitrectomy w/ repair of macular hole    . Back surgery  times 2  . Tonsillectomy    . Cervical cone biopsy    . Video bronchoscopy with endobronchial ultrasound N/A 04/07/2014    Procedure: VIDEO BRONCHOSCOPY WITH ENDOBRONCHIAL ULTRASOUND;  Surgeon: Melrose Nakayama, MD;  Location: [facility];  Service: Thoracic;   Laterality: N/A;    has Hypertension; Hyperlipidemia; Hx of thyroid cancer; Status post bilateral breast implants; Consolidation lung; Non-small cell cancer of right lung; Rash; and Vaginal irritation on her problem list.     is allergic to niacin and related.    Medication List       This list is accurate as of: 05/15/14  5:26 PM.  Always use your most recent med list.               aspirin EC 81 MG tablet  Take 162 mg by mouth at bedtime.     clonazePAM 0.5 MG tablet  Commonly known as:  KLONOPIN  Take 0.5 mg by mouth at bedtime as needed for anxiety.     emollient cream  Commonly known as:  BIAFINE  Apply 1 application topically daily. Apply to chest area after rad tx when skin becomes irritated or itching     estradiol 0.5 MG tablet  Commonly known as:  ESTRACE  Take 0.5 mg by mouth daily.     ibuprofen 100 MG tablet  Commonly known as:  ADVIL,MOTRIN  Take 200 mg by mouth every 6 (six) hours as needed for fever.     levothyroxine 125 MCG tablet  Commonly known as:  SYNTHROID, LEVOTHROID  Take 250 mcg by mouth daily before breakfast.     magic mouthwash w/lidocaine Soln  Take 5 mLs by mouth 3 (three) times daily as needed for mouth pain.     medroxyPROGESTERone 2.5 MG tablet  Commonly known as:  PROVERA  Take 2.5 mg by mouth daily.     morphine 15 MG tablet  Commonly known as:  MSIR  Take 1/2 tablet to 1 tablet po q 6 hours prn pain     nicotine 14 mg/24hr patch  Commonly known as:  NICODERM CQ - dosed in mg/24 hours  Place 14 mg onto the skin daily.     prochlorperazine 10 MG tablet  Commonly known as:  COMPAZINE  Take 1 tablet (10 mg total) by mouth every 6 (six) hours as needed for nausea or vomiting.         PHYSICAL EXAMINATION  Vitals: BP 114/72, HR 72, temp 98.8  Physical Exam  Nursing note and vitals reviewed. Constitutional: She is oriented to person, place, and time and well-developed, well-nourished, and in no distress.  HENT:  Head:  Normocephalic.  Mouth/Throat: Oropharynx is clear and moist.  Eyes: Conjunctivae and EOM are normal. Pupils are equal, round, and reactive to light. No scleral icterus.  Neck: Normal range of motion. Neck supple. No JVD present.  Pulmonary/Chest: Effort normal. No respiratory distress.  Musculoskeletal: Normal range of motion.  Neurological: She is alert and oriented to person, place, and time.  Skin: Skin is warm and dry. Rash noted. There is erythema.  Patient does have some vesicular rash to her right upper back that is scabbed over in some areas.  Rash extends from right upper back to top of right shoulder.  Also has some fine rash to her right lower back.  No evidence of active infection noted.  Psychiatric: Affect normal.    LABORATORY DATA:. Orders Only on 05/15/2014  Component Date Value Ref Range Status  .  Glucose 05/15/2014 Negative  Negative mg/dL Final  . Bilirubin (Urine) 05/15/2014 Negative  Negative Final  . Ketones 05/15/2014 Negative  Negative mg/dL Final  . Specific Gravity, Urine 05/15/2014 1.005  1.003 - 1.035 Final  . Blood 05/15/2014 Negative  Negative Final  . pH 05/15/2014 6.0  4.6 - 8.0 Final  . Protein 05/15/2014 Negative  Negative- <30 mg/dL Final  . Urobilinogen, UR 05/15/2014 0.2  0.2 - 1 mg/dL Final  . Nitrite 05/15/2014 Negative  Negative Final  . Leukocyte Esterase 05/15/2014 Negative  Negative Final  . RBC / HPF 05/15/2014 Negative  0 - 2 Final  . WBC, UA 05/15/2014 Negative  0 - 2 Final  . Epithelial Cells 05/15/2014 Occasional  Negative- Few Final  Appointment on 05/15/2014  Component Date Value Ref Range Status  . WBC 05/15/2014 5.2  3.9 - 10.3 10e3/uL Final  . NEUT# 05/15/2014 3.1  1.5 - 6.5 10e3/uL Final  . HGB 05/15/2014 11.5* 11.6 - 15.9 g/dL Final  . HCT 05/15/2014 35.2  34.8 - 46.6 % Final  . Platelets 05/15/2014 354  145 - 400 10e3/uL Final  . MCV 05/15/2014 89.6  79.5 - 101.0 fL Final  . MCH 05/15/2014 29.3  25.1 - 34.0 pg Final  . MCHC  05/15/2014 32.7  31.5 - 36.0 g/dL Final  . RBC 05/15/2014 3.93  3.70 - 5.45 10e6/uL Final  . RDW 05/15/2014 13.8  11.2 - 14.5 % Final  . lymph# 05/15/2014 1.1  0.9 - 3.3 10e3/uL Final  . MONO# 05/15/2014 0.8  0.1 - 0.9 10e3/uL Final  . Eosinophils Absolute 05/15/2014 0.2  0.0 - 0.5 10e3/uL Final  . Basophils Absolute 05/15/2014 0.0  0.0 - 0.1 10e3/uL Final  . NEUT% 05/15/2014 59.8  38.4 - 76.8 % Final  . LYMPH% 05/15/2014 20.5  14.0 - 49.7 % Final  . MONO% 05/15/2014 15.1* 0.0 - 14.0 % Final  . EOS% 05/15/2014 3.8  0.0 - 7.0 % Final  . BASO% 05/15/2014 0.8  0.0 - 2.0 % Final  . Sodium 05/15/2014 137  136 - 145 mEq/L Final  . Potassium 05/15/2014 4.1  3.5 - 5.1 mEq/L Final  . Chloride 05/15/2014 107  98 - 109 mEq/L Final  . CO2 05/15/2014 22  22 - 29 mEq/L Final  . Glucose 05/15/2014 89  70 - 140 mg/dl Final  . BUN 05/15/2014 8.5  7.0 - 26.0 mg/dL Final  . Creatinine 05/15/2014 0.6  0.6 - 1.1 mg/dL Final  . Total Bilirubin 05/15/2014 0.37  0.20 - 1.20 mg/dL Final  . Alkaline Phosphatase 05/15/2014 126  40 - 150 U/L Final  . AST 05/15/2014 16  5 - 34 U/L Final  . ALT 05/15/2014 23  0 - 55 U/L Final  . Total Protein 05/15/2014 6.9  6.4 - 8.3 g/dL Final  . Albumin 05/15/2014 3.2* 3.5 - 5.0 g/dL Final  . Calcium 05/15/2014 9.7  8.4 - 10.4 mg/dL Final  . Anion Gap 05/15/2014 8  3 - 11 mEq/L Final     RADIOGRAPHIC STUDIES: No results found.  ASSESSMENT/PLAN:    Non-small cell cancer of right lung  Assessment & Plan Patient will proceed today with cycle 3 of her carboplatin/paclitaxel chemotherapy regimen.  She will return for cycle 4 of the same regimen on 05/22/2014.  She'll also continue with radiation treatments on a Monday through Friday basis.   Rash  Assessment & Plan Patient does have a fine vesicular rash with scabbing to her right upper back; and  some fine rash to her right lower back.  Patient is complaining of pruritus to this rash.  She denies any specific pain to the  rash.  Patient denies any other new symptoms whatsoever.  Advised patient to try Benadryl 25 mg every 6 hours and Pepcid 20 mg every 12 hours to counteract possible reaction rash.  Patient denies any throat swelling or other airway issues whatsoever.  Patient has plans to return to the [facility] first thing in the morning to recheck rash.  Patient was advised to call or go directed to the emergency department overnight if she has any new or worsening symptoms whatsoever.   Vaginal irritation  Assessment & Plan Patient is complaining of some mild vaginal irritation .  She states that she has no dysuria, frequency, hesitancy, or hematuria.  She also denies vaginal discharge or odor. She states that she noticed the vaginal irritation after wearing tight jeans.  Urinalysis obtained today was negative for UTI.  Pending urine culture. Patient was advised to wear loose fitting clothes; and she may try some Aquaphor for the vaginal irritation.      Patient stated understanding of all instructions; and was in agreement with this plan of care. The patient knows to call the clinic with any problems, questions or concerns.   Review/collaboration with Dr.  Julien Nordmann  regarding all aspects of patient's visit today.   Total time spent with patient was  25  minutes;  with greater than 75  percent of that time spent in face to face counseling regarding  her symptoms,  and coordination of care and follow up.  Disclaimer: This note was dictated with voice recognition software. Similar sounding words can inadvertently be transcribed and may not be corrected upon review.   Drue Second, NP 05/15/2014

## 2014-05-15 NOTE — Assessment & Plan Note (Signed)
Patient does have a fine vesicular rash with scabbing to her right upper back; and some fine rash to her right lower back.  Patient is complaining of pruritus to this rash.  She denies any specific pain to the rash.  Patient denies any other new symptoms whatsoever.  Advised patient to try Benadryl 25 mg every 6 hours and Pepcid 20 mg every 12 hours to counteract possible reaction rash.  Patient denies any throat swelling or other airway issues whatsoever.  Patient has plans to return to the [facility] first thing in the morning to recheck rash.  Patient was advised to call or go directed to the emergency department overnight if she has any new or worsening symptoms whatsoever.

## 2014-05-15 NOTE — Progress Notes (Signed)
Pt. With low grade temp of 99.4 orally. Also c/o foul smelling urine. Selena Lesser, NP notified and received orders for u/a and urine culture. This was obtained and pt. Seen by NP

## 2014-05-16 ENCOUNTER — Encounter: Payer: Self-pay | Admitting: Nurse Practitioner

## 2014-05-16 ENCOUNTER — Ambulatory Visit (HOSPITAL_BASED_OUTPATIENT_CLINIC_OR_DEPARTMENT_OTHER): Payer: Medicare Other | Admitting: Nurse Practitioner

## 2014-05-16 ENCOUNTER — Ambulatory Visit: Payer: Medicare Other | Admitting: Nutrition

## 2014-05-16 ENCOUNTER — Ambulatory Visit
Admission: RE | Admit: 2014-05-16 | Discharge: 2014-05-16 | Disposition: A | Discharge status: Home / Self Care | Payer: Medicare Other | Source: Ambulatory Visit | Attending: Radiation Oncology | Admitting: Radiation Oncology

## 2014-05-16 VITALS — BP 163/81 | HR 110 | Temp 97.9°F | Resp 20 | Ht 68.0 in | Wt 144.9 lb

## 2014-05-16 DIAGNOSIS — R21 Rash and other nonspecific skin eruption: Secondary | ICD-10-CM

## 2014-05-16 DIAGNOSIS — Z510 Encounter for antineoplastic radiation therapy: Secondary | ICD-10-CM | POA: Diagnosis not present

## 2014-05-16 DIAGNOSIS — C3431 Malignant neoplasm of lower lobe, right bronchus or lung: Secondary | ICD-10-CM

## 2014-05-16 DIAGNOSIS — C3491 Malignant neoplasm of unspecified part of right bronchus or lung: Secondary | ICD-10-CM

## 2014-05-16 DIAGNOSIS — N898 Other specified noninflammatory disorders of vagina: Secondary | ICD-10-CM

## 2014-05-16 NOTE — Assessment & Plan Note (Signed)
Patient received cycle 3 of her carboplatin/paclitaxel chemotherapy just yesterday 05/15/2014.  She is cleansed return for cycle 4 of the same regimen on 05/22/2014.  She'll also continue with radiation treatment on a Monday through Friday basis as well.

## 2014-05-16 NOTE — Assessment & Plan Note (Signed)
Per patient's report-mild vaginal irritation has greatly improved overnight since using Aquaphor ointment. Advised patient to let us know if vaginal irritation does not continue to improve.

## 2014-05-16 NOTE — Progress Notes (Signed)
68 year old female diagnosed with lung cancer receiving concurrent chemoradiation therapy.  She is a patient of Dr. Earlie Server.  Past medical history includes thyroid cancer, hypertension, hypothyroidism, and anxiety.  Medications include Magic mouthwash, Klonopin, Synthroid, and Compazine.  Labs include albumin 3.2 on October 19.  Height: 68 inches. Weight: 143.2 pounds. Usual body weight: 145 pounds. BMI: 21.78.  Patient reports she has a good appetite and is eating well.  She denies nutrition side effects other than some slight dysphasia.  She is approximately 2 weeks into her radiation therapy.  She reports she did have constipation several days ago, but this has resolved.  She is taking MiraLax on a regular basis.  Patient is interested in healthy diet to help fight her cancer.  Patient has questions regarding, organic food.  Nutrition diagnosis: Food and nutrition related knowledge deficit related to diagnosis of lung cancer and associated treatments as evidenced by no prior need for nutrition related information.  Intervention: Patient educated to consume healthy, plant-based diet with lean proteins to promote weight maintenance.   Educated patient on choosing organic foods.   Provided recipes on smoothies and using more herbs and spices.  Provided fact sheets for patient to take with her today.   Questions were answered and teach back method was used.   Patient was provided with my contact information.  Monitoring, evaluation, goals: Patient will tolerate adequate calories and protein to promote weight maintenance.  She will contact me for any further questions or concerns.  **Disclaimer: This note was dictated with voice recognition software. Similar sounding words can inadvertently be transcribed and this note may contain transcription errors which may not have been corrected upon publication of note.**

## 2014-05-16 NOTE — Progress Notes (Signed)
SYMPTOM MANAGEMENT CLINIC   HPI: [name] 68 y.o. female diagnosed with lung cancer.  Currently undergoing carboplatin/paclitaxel chemotherapy regimen; as well as radiation therapy.   Patient was seen in symptom management clinic just yesterday prior to her chemotherapy due to complaint of rash.  She developed a rash within the past 24 hours to her right upper back that was radiating to the top of her right shoulder.  This rash was pruritic.  She denied pain or tenderness to the rash site.  She also denies any other allergic--type symptoms whatsoever.    Patient was instructed to try Benadryl 25 mg every 6 hours and Pepcid 20 mg every 12 hours overnight.  Patient in for recheck today; and states that her rash has greatly improved overnight with both Benadryl and Pepcid.  She also applied some hydrocortisone cream to the rash overnight as well.  She denies any new symptoms whatsoever.    Also, patient was complaining yesterday of some very mild vaginal irritation; stating that is most likely do to her wearing some tight jeans.  She denied any dysuria, frequency, hematuria, vaginal discharge, or foul odor.  She states that she used some Aquaphor ointment to the vaginal area overnight; and this is also greatly improved.  HPI  CURRENT THERAPY: Upcoming Treatment Dates - LUNG Carboplatin / Paclitaxel + XRT q7d Days with orders from any treatment category:  05/22/2014      SCHEDULING COMMUNICATION      diphenhydrAMINE (BENADRYL) injection 50 mg      Dexamethasone Sodium Phosphate (DECADRON) injection 20 mg      ondansetron (ZOFRAN) IVPB 16 mg      famotidine (PEPCID) IVPB 20 mg      PACLitaxel (TAXOL) 78 mg in dextrose 5 % 250 mL chemo infusion (</= 80mg /m2)      CARBOplatin (PARAPLATIN) 170 mg in sodium chloride 0.9 % 100 mL chemo infusion      sodium chloride 0.9 % injection 10 mL      heparin lock flush 100 unit/mL      heparin lock flush 100 unit/mL      alteplase (CATHFLO ACTIVASE)  injection 2 mg      sodium chloride 0.9 % injection 3 mL      Cold Pack 1 packet      diphenhydrAMINE (BENADRYL) injection 25 mg      famotidine (PEPCID) IVPB 20 mg      0.9 %  sodium chloride infusion      methylPREDNISolone sodium succinate (SOLU-MEDROL) 125 mg/2 mL injection 125 mg      EPINEPHrine (ADRENALIN) 0.1 MG/ML injection 0.25 mg      EPINEPHrine (ADRENALIN) 0.1 MG/ML injection 0.25 mg      EPINEPHrine (ADRENALIN) injection 0.5 mg      EPINEPHrine (ADRENALIN) injection 0.5 mg      diphenhydrAMINE (BENADRYL) injection 50 mg      albuterol (PROVENTIL) (2.5 MG/3ML) 0.083% nebulizer solution 2.5 mg      0.9 %  sodium chloride infusion      TREATMENT CONDITIONS 05/29/2014      SCHEDULING COMMUNICATION      diphenhydrAMINE (BENADRYL) injection 50 mg      Dexamethasone Sodium Phosphate (DECADRON) injection 20 mg      ondansetron (ZOFRAN) IVPB 16 mg      famotidine (PEPCID) IVPB 20 mg      PACLitaxel (TAXOL) 78 mg in dextrose 5 % 250 mL chemo infusion (</= 80mg /m2)      CARBOplatin (  PARAPLATIN) 170 mg in sodium chloride 0.9 % 100 mL chemo infusion      sodium chloride 0.9 % injection 10 mL      heparin lock flush 100 unit/mL      heparin lock flush 100 unit/mL      alteplase (CATHFLO ACTIVASE) injection 2 mg      sodium chloride 0.9 % injection 3 mL      Cold Pack 1 packet      diphenhydrAMINE (BENADRYL) injection 25 mg      famotidine (PEPCID) IVPB 20 mg      0.9 %  sodium chloride infusion      methylPREDNISolone sodium succinate (SOLU-MEDROL) 125 mg/2 mL injection 125 mg      EPINEPHrine (ADRENALIN) 0.1 MG/ML injection 0.25 mg      EPINEPHrine (ADRENALIN) 0.1 MG/ML injection 0.25 mg      EPINEPHrine (ADRENALIN) injection 0.5 mg      EPINEPHrine (ADRENALIN) injection 0.5 mg      diphenhydrAMINE (BENADRYL) injection 50 mg      albuterol (PROVENTIL) (2.5 MG/3ML) 0.083% nebulizer solution 2.5 mg      0.9 %  sodium chloride infusion      TREATMENT CONDITIONS 06/05/2014       SCHEDULING COMMUNICATION      diphenhydrAMINE (BENADRYL) injection 50 mg      Dexamethasone Sodium Phosphate (DECADRON) injection 20 mg      ondansetron (ZOFRAN) IVPB 16 mg      famotidine (PEPCID) IVPB 20 mg      PACLitaxel (TAXOL) 78 mg in dextrose 5 % 250 mL chemo infusion (</= 80mg /m2)      CARBOplatin (PARAPLATIN) 170 mg in sodium chloride 0.9 % 100 mL chemo infusion      sodium chloride 0.9 % injection 10 mL      heparin lock flush 100 unit/mL      heparin lock flush 100 unit/mL      alteplase (CATHFLO ACTIVASE) injection 2 mg      sodium chloride 0.9 % injection 3 mL      Cold Pack 1 packet      diphenhydrAMINE (BENADRYL) injection 25 mg      famotidine (PEPCID) IVPB 20 mg      0.9 %  sodium chloride infusion      methylPREDNISolone sodium succinate (SOLU-MEDROL) 125 mg/2 mL injection 125 mg      EPINEPHrine (ADRENALIN) 0.1 MG/ML injection 0.25 mg      EPINEPHrine (ADRENALIN) 0.1 MG/ML injection 0.25 mg      EPINEPHrine (ADRENALIN) injection 0.5 mg      EPINEPHrine (ADRENALIN) injection 0.5 mg      diphenhydrAMINE (BENADRYL) injection 50 mg      albuterol (PROVENTIL) (2.5 MG/3ML) 0.083% nebulizer solution 2.5 mg      0.9 %  sodium chloride infusion      TREATMENT CONDITIONS    ROS  Past Medical History  Diagnosis Date  . Thyroid disease     hypothyroidism  . Hx of thyroid cancer   . Status post bilateral breast implants   . Consolidation lung 03/21/14    RLL PER CXR/CT  . Hypertension     just started,not on medication  . Shortness of breath     exertion  . Hypothyroidism   . Mitral valve prolapse   . Anxiety   . Arthritis   . Cancer     thyroid  . Fibromyalgia   . Complication of anesthesia     headaches,very agitated  while waking  up    Past Surgical History  Procedure Laterality Date  . Thyroidectomy    . Cataract extraction Right   . Breast enhancement surgery    . Retinal detachment surgery    . Eye surgery    . Pars plana vitrectomy w/ repair of  macular hole    . Back surgery      times 2  . Tonsillectomy    . Cervical cone biopsy    . Video bronchoscopy with endobronchial ultrasound N/A 04/07/2014    Procedure: VIDEO BRONCHOSCOPY WITH ENDOBRONCHIAL ULTRASOUND;  Surgeon: Melrose Nakayama, MD;  Location: [facility];  Service: Thoracic;  Laterality: N/A;    has Hypertension; Hyperlipidemia; Hx of thyroid cancer; Status post bilateral breast implants; Consolidation lung; Non-small cell cancer of right lung; Rash; and Vaginal irritation on her problem list.     is allergic to niacin and related.    Medication List       This list is accurate as of: 05/16/14  1:54 PM.  Always use your most recent med list.               aspirin EC 81 MG tablet  Take 162 mg by mouth at bedtime.     clonazePAM 0.5 MG tablet  Commonly known as:  KLONOPIN  Take 0.5 mg by mouth at bedtime as needed for anxiety.     emollient cream  Commonly known as:  BIAFINE  Apply 1 application topically daily. Apply to chest area after rad tx when skin becomes irritated or itching     estradiol 0.5 MG tablet  Commonly known as:  ESTRACE  Take 0.5 mg by mouth daily.     ibuprofen 100 MG tablet  Commonly known as:  ADVIL,MOTRIN  Take 200 mg by mouth every 6 (six) hours as needed for fever.     levothyroxine 125 MCG tablet  Commonly known as:  SYNTHROID, LEVOTHROID  Take 250 mcg by mouth daily before breakfast.     magic mouthwash w/lidocaine Soln  Take 5 mLs by mouth 3 (three) times daily as needed for mouth pain.     medroxyPROGESTERone 2.5 MG tablet  Commonly known as:  PROVERA  Take 2.5 mg by mouth daily.     morphine 15 MG tablet  Commonly known as:  MSIR  Take 1/2 tablet to 1 tablet po q 6 hours prn pain     nicotine 14 mg/24hr patch  Commonly known as:  NICODERM CQ - dosed in mg/24 hours  Place 14 mg onto the skin daily.     prochlorperazine 10 MG tablet  Commonly known as:  COMPAZINE  Take 1 tablet (10 mg total) by mouth every 6  (six) hours as needed for nausea or vomiting.         PHYSICAL EXAMINATION  Blood pressure 163/81, pulse 110, temperature 97.9 F (36.6 C), temperature source Oral, resp. rate 20, height 5\' 8"  (1.727 m), weight 144 lb 14.4 oz (65.726 kg), SpO2 95.00%.  Physical Exam  LABORATORY DATA:. Orders Only on 05/15/2014  Component Date Value Ref Range Status  . Glucose 05/15/2014 Negative  Negative mg/dL Final  . Bilirubin (Urine) 05/15/2014 Negative  Negative Final  . Ketones 05/15/2014 Negative  Negative mg/dL Final  . Specific Gravity, Urine 05/15/2014 1.005  1.003 - 1.035 Final  . Blood 05/15/2014 Negative  Negative Final  . pH 05/15/2014 6.0  4.6 - 8.0 Final  . Protein 05/15/2014 Negative  Negative- <30 mg/dL Final  .  Urobilinogen, UR 05/15/2014 0.2  0.2 - 1 mg/dL Final  . Nitrite 05/15/2014 Negative  Negative Final  . Leukocyte Esterase 05/15/2014 Negative  Negative Final  . RBC / HPF 05/15/2014 Negative  0 - 2 Final  . WBC, UA 05/15/2014 Negative  0 - 2 Final  . Epithelial Cells 05/15/2014 Occasional  Negative- Few Final  Appointment on 05/15/2014  Component Date Value Ref Range Status  . WBC 05/15/2014 5.2  3.9 - 10.3 10e3/uL Final  . NEUT# 05/15/2014 3.1  1.5 - 6.5 10e3/uL Final  . HGB 05/15/2014 11.5* 11.6 - 15.9 g/dL Final  . HCT 05/15/2014 35.2  34.8 - 46.6 % Final  . Platelets 05/15/2014 354  145 - 400 10e3/uL Final  . MCV 05/15/2014 89.6  79.5 - 101.0 fL Final  . MCH 05/15/2014 29.3  25.1 - 34.0 pg Final  . MCHC 05/15/2014 32.7  31.5 - 36.0 g/dL Final  . RBC 05/15/2014 3.93  3.70 - 5.45 10e6/uL Final  . RDW 05/15/2014 13.8  11.2 - 14.5 % Final  . lymph# 05/15/2014 1.1  0.9 - 3.3 10e3/uL Final  . MONO# 05/15/2014 0.8  0.1 - 0.9 10e3/uL Final  . Eosinophils Absolute 05/15/2014 0.2  0.0 - 0.5 10e3/uL Final  . Basophils Absolute 05/15/2014 0.0  0.0 - 0.1 10e3/uL Final  . NEUT% 05/15/2014 59.8  38.4 - 76.8 % Final  . LYMPH% 05/15/2014 20.5  14.0 - 49.7 % Final  . MONO%  05/15/2014 15.1* 0.0 - 14.0 % Final  . EOS% 05/15/2014 3.8  0.0 - 7.0 % Final  . BASO% 05/15/2014 0.8  0.0 - 2.0 % Final  . Sodium 05/15/2014 137  136 - 145 mEq/L Final  . Potassium 05/15/2014 4.1  3.5 - 5.1 mEq/L Final  . Chloride 05/15/2014 107  98 - 109 mEq/L Final  . CO2 05/15/2014 22  22 - 29 mEq/L Final  . Glucose 05/15/2014 89  70 - 140 mg/dl Final  . BUN 05/15/2014 8.5  7.0 - 26.0 mg/dL Final  . Creatinine 05/15/2014 0.6  0.6 - 1.1 mg/dL Final  . Total Bilirubin 05/15/2014 0.37  0.20 - 1.20 mg/dL Final  . Alkaline Phosphatase 05/15/2014 126  40 - 150 U/L Final  . AST 05/15/2014 16  5 - 34 U/L Final  . ALT 05/15/2014 23  0 - 55 U/L Final  . Total Protein 05/15/2014 6.9  6.4 - 8.3 g/dL Final  . Albumin 05/15/2014 3.2* 3.5 - 5.0 g/dL Final  . Calcium 05/15/2014 9.7  8.4 - 10.4 mg/dL Final  . Anion Gap 05/15/2014 8  3 - 11 mEq/L Final     RADIOGRAPHIC STUDIES: No results found.  ASSESSMENT/PLAN:    Non-small cell cancer of right lung  Assessment & Plan Patient received cycle 3 of her carboplatin/paclitaxel chemotherapy just yesterday 05/15/2014.  She is cleansed return for cycle 4 of the same regimen on 05/22/2014.  She'll also continue with radiation treatment on a Monday through Friday basis as well.   Rash  Assessment & Plan Patient's rash to her right upper back and right shoulder does appear to be improving.  There is no increasing rash whatsoever.  Patient states that she has been taking Benadryl 25 mg every 6 hours and Pepcid 20 mg every 12 hours.  She denies any other allergic-type symptoms whatsoever.   Vaginal irritation  Assessment & Plan Per patient's report-mild vaginal irritation has greatly improved overnight since using Aquaphor ointment. Advised patient to let us know if vaginal  irritation does not continue to improve.  Patient plans to receive her radiation therapy today.  Advised patient to mention her rash symptoms radiation oncology staff prior to  her treatment today.  Patient stated understanding of all instructions; and was in agreement with this plan of care. The patient knows to call the clinic with any problems, questions or concerns.   Review/collaboration with Dr. Julien Nordmann  And Dr. Pablo Ledger regarding all aspects of patient's visit today.   Total time spent with patient was 15 minutes;  with greater than 80 percent of that time spent in face to face counseling regarding her symptoms, and coordination of care and follow up.  Disclaimer: This note was dictated with voice recognition software. Similar sounding words can inadvertently be transcribed and may not be corrected upon review.   Drue Second, NP 05/16/2014

## 2014-05-16 NOTE — Assessment & Plan Note (Signed)
Patient's rash to her right upper back and right shoulder does appear to be improving.  There is no increasing rash whatsoever.  Patient states that she has been taking Benadryl 25 mg every 6 hours and Pepcid 20 mg every 12 hours.  She denies any other allergic-type symptoms whatsoever.

## 2014-05-17 ENCOUNTER — Ambulatory Visit
Admission: RE | Admit: 2014-05-17 | Discharge: 2014-05-17 | Disposition: A | Discharge status: Home / Self Care | Payer: Medicare Other | Source: Ambulatory Visit | Attending: Radiation Oncology | Admitting: Radiation Oncology

## 2014-05-17 DIAGNOSIS — Z510 Encounter for antineoplastic radiation therapy: Secondary | ICD-10-CM | POA: Diagnosis not present

## 2014-05-17 LAB — URINE CULTURE

## 2014-05-18 ENCOUNTER — Ambulatory Visit: Payer: Medicare Other | Admitting: Radiation Oncology

## 2014-05-18 ENCOUNTER — Ambulatory Visit
Admission: RE | Admit: 2014-05-18 | Discharge: 2014-05-18 | Disposition: A | Discharge status: Home / Self Care | Payer: Medicare Other | Source: Ambulatory Visit | Attending: Radiation Oncology | Admitting: Radiation Oncology

## 2014-05-18 DIAGNOSIS — Z510 Encounter for antineoplastic radiation therapy: Secondary | ICD-10-CM | POA: Diagnosis not present

## 2014-05-18 DIAGNOSIS — C3491 Malignant neoplasm of unspecified part of right bronchus or lung: Secondary | ICD-10-CM

## 2014-05-18 NOTE — Progress Notes (Signed)
Weekly Management Note Current Dose:  28 Gy  Projected Dose: 60 Gy   Narrative:  The patient presents for routine under treatment assessment.  CBCT/MVCT images/Port film x-rays were reviewed.  The chart was checked. Dysphagia worse this week. Taking hycet and mmw. Cough improved. Worried about husband and stroke like symptoms. Reviewed CBCT and there is some clearing of her right lower lobe.   Physical Findings: Weight:  . Unchanged  Impression:  The patient is tolerating radiation.  Plan:  Continue treatment as planned. Continue supportive meds. Rescan to see if some normal lung can be spared.

## 2014-05-18 NOTE — Progress Notes (Signed)
Patient seen in treatment room by Dr.wentworth.Patient in hurry as she needed to take husband to the emergency room.

## 2014-05-19 ENCOUNTER — Ambulatory Visit
Admission: RE | Admit: 2014-05-19 | Discharge: 2014-05-19 | Disposition: A | Discharge status: Home / Self Care | Payer: Medicare Other | Source: Ambulatory Visit | Attending: Radiation Oncology | Admitting: Radiation Oncology

## 2014-05-19 ENCOUNTER — Telehealth: Payer: Self-pay | Admitting: Internal Medicine

## 2014-05-19 ENCOUNTER — Ambulatory Visit (HOSPITAL_BASED_OUTPATIENT_CLINIC_OR_DEPARTMENT_OTHER): Payer: Medicare Other | Admitting: Oncology

## 2014-05-19 ENCOUNTER — Other Ambulatory Visit (HOSPITAL_BASED_OUTPATIENT_CLINIC_OR_DEPARTMENT_OTHER): Payer: Medicare Other

## 2014-05-19 ENCOUNTER — Encounter: Payer: Self-pay | Admitting: Oncology

## 2014-05-19 VITALS — BP 151/82 | HR 111 | Temp 98.6°F | Resp 21 | Ht 68.0 in | Wt 142.3 lb

## 2014-05-19 DIAGNOSIS — C3491 Malignant neoplasm of unspecified part of right bronchus or lung: Secondary | ICD-10-CM

## 2014-05-19 DIAGNOSIS — R300 Dysuria: Secondary | ICD-10-CM

## 2014-05-19 DIAGNOSIS — N898 Other specified noninflammatory disorders of vagina: Secondary | ICD-10-CM

## 2014-05-19 DIAGNOSIS — Z510 Encounter for antineoplastic radiation therapy: Secondary | ICD-10-CM | POA: Diagnosis not present

## 2014-05-19 DIAGNOSIS — C3431 Malignant neoplasm of lower lobe, right bronchus or lung: Secondary | ICD-10-CM

## 2014-05-19 LAB — COMPREHENSIVE METABOLIC PANEL (CC13)
ALT: 23 U/L (ref 0–55)
ANION GAP: 12 meq/L — AB (ref 3–11)
AST: 13 U/L (ref 5–34)
Albumin: 3.4 g/dL — ABNORMAL LOW (ref 3.5–5.0)
Alkaline Phosphatase: 125 U/L (ref 40–150)
BUN: 13.8 mg/dL (ref 7.0–26.0)
CHLORIDE: 106 meq/L (ref 98–109)
CO2: 22 meq/L (ref 22–29)
Calcium: 10 mg/dL (ref 8.4–10.4)
Creatinine: 0.7 mg/dL (ref 0.6–1.1)
Glucose: 116 mg/dl (ref 70–140)
Potassium: 4.5 mEq/L (ref 3.5–5.1)
Sodium: 140 mEq/L (ref 136–145)
TOTAL PROTEIN: 7.1 g/dL (ref 6.4–8.3)
Total Bilirubin: 0.55 mg/dL (ref 0.20–1.20)

## 2014-05-19 LAB — CBC WITH DIFFERENTIAL/PLATELET
BASO%: 0.6 % (ref 0.0–2.0)
BASOS ABS: 0 10*3/uL (ref 0.0–0.1)
EOS ABS: 0.1 10*3/uL (ref 0.0–0.5)
EOS%: 1.8 % (ref 0.0–7.0)
HCT: 36.7 % (ref 34.8–46.6)
HGB: 11.9 g/dL (ref 11.6–15.9)
LYMPH%: 10.9 % — AB (ref 14.0–49.7)
MCH: 29.3 pg (ref 25.1–34.0)
MCHC: 32.4 g/dL (ref 31.5–36.0)
MCV: 90.4 fL (ref 79.5–101.0)
MONO#: 0.3 10*3/uL (ref 0.1–0.9)
MONO%: 5.7 % (ref 0.0–14.0)
NEUT%: 81 % — AB (ref 38.4–76.8)
NEUTROS ABS: 4.2 10*3/uL (ref 1.5–6.5)
PLATELETS: 354 10*3/uL (ref 145–400)
RBC: 4.06 10*6/uL (ref 3.70–5.45)
RDW: 14.2 % (ref 11.2–14.5)
WBC: 5.1 10*3/uL (ref 3.9–10.3)
lymph#: 0.6 10*3/uL — ABNORMAL LOW (ref 0.9–3.3)

## 2014-05-19 LAB — URINALYSIS, MICROSCOPIC - CHCC
BLOOD: NEGATIVE
Bilirubin (Urine): NEGATIVE
Glucose: NEGATIVE mg/dL
Ketones: NEGATIVE mg/dL
Leukocyte Esterase: NEGATIVE
NITRITE: NEGATIVE
PH: 6.5 (ref 4.6–8.0)
PROTEIN: NEGATIVE mg/dL
Specific Gravity, Urine: 1.005 (ref 1.003–1.035)
UROBILINOGEN UR: 0.2 mg/dL (ref 0.2–1)

## 2014-05-19 NOTE — Progress Notes (Signed)
No images are attached to the encounter. No scans are attached to the encounter. No scans are attached to the encounter. [place], [place] [address] [place] [address]  DIAGNOSIS: Non-small cell cancer of right lung   Primary site: Lung   Staging method: AJCC 7th Edition   Clinical: Stage IIIA (T3, N2, M0) signed by Thea Silversmith, MD on 04/20/2014  5:57 PM   Summary: Stage IIIA (T3, N2, M0)  PRIOR THERAPY: None  CURRENT THERAPY: Concurrent chemoradiation with weekly carboplatin for an AUC of 2 and paclitaxel 45 mg per meter squared for a total of 6-7 weeks depending on the final dose radiation.  INTERVAL HISTORY: [name] 68 y.o. female returns for a scheduled regular symptom management visit for followup of her recently diagnosed as stage IIIa (T3, N2, M0) non-small cell lung cancer, squamous cell carcinoma diagnosed in September 2015. She is currently undergoing a course of concurrent chemoradiation. Due for her 4th cycle of chemo on 05/22/14. Thus far she's tolerating her course of concurrent chemoradiation relatively well although she has begun to have some reflux/heartburn-type symptoms. She uses Pepcid as needed with relief. The patient developed a rash to her right upper back and right shoulder earlier this week. She used Benadryl for the itching and reports that her rash and itching have resolved. She denies fever, chills, cough or hemoptysis. Shortness of breath with exertion. Denies nausea or vomiting. Denied diarrhea or constipation. She denied any night sweats or significant weight loss. Of note, the patient reports that her husband had a mild stroke yesterday and is now in the hospital.  MEDICAL HISTORY: Past Medical History  Diagnosis Date  . Thyroid disease     hypothyroidism  . Hx of thyroid cancer   . Status post bilateral breast implants   . Consolidation lung 03/21/14    RLL PER CXR/CT  .  Hypertension     just started,not on medication  . Shortness of breath     exertion  . Hypothyroidism   . Mitral valve prolapse   . Anxiety   . Arthritis   . Cancer     thyroid  . Fibromyalgia   . Complication of anesthesia     headaches,very agitated while waking  up    ALLERGIES:  is allergic to niacin and related.  MEDICATIONS:  Current Outpatient Prescriptions  Medication Sig Dispense Refill  . Alum & Mag Hydroxide-Simeth (MAGIC MOUTHWASH W/LIDOCAINE) SOLN Take 5 mLs by mouth 3 (three) times daily as needed for mouth pain.  480 mL  1  . aspirin EC 81 MG tablet Take 162 mg by mouth at bedtime.       . clonazePAM (KLONOPIN) 0.5 MG tablet Take 0.5 mg by mouth at bedtime as needed for anxiety.      Marland Kitchen emollient (BIAFINE) cream Apply 1 application topically daily. Apply to chest area after rad tx when skin becomes irritated or itching      . estradiol (ESTRACE) 0.5 MG tablet Take 0.5 mg by mouth daily.      Marland Kitchen ibuprofen (ADVIL,MOTRIN) 100 MG tablet Take 200 mg by mouth every 6 (six) hours as needed for fever.      . levothyroxine (SYNTHROID, LEVOTHROID) 125 MCG tablet Take 250 mcg by mouth daily before breakfast.       . medroxyPROGESTERone (PROVERA) 2.5 MG tablet Take 2.5 mg by mouth daily.      Marland Kitchen morphine (MSIR)  15 MG tablet Take 1/2 tablet to 1 tablet po q 6 hours prn pain  60 tablet  0  . nicotine (NICODERM CQ - DOSED IN MG/24 HOURS) 14 mg/24hr patch Place 14 mg onto the skin daily.      . prochlorperazine (COMPAZINE) 10 MG tablet Take 1 tablet (10 mg total) by mouth every 6 (six) hours as needed for nausea or vomiting.  30 tablet  2   No current facility-administered medications for this visit.    SURGICAL HISTORY:  Past Surgical History  Procedure Laterality Date  . Thyroidectomy    . Cataract extraction Right   . Breast enhancement surgery    . Retinal detachment surgery    . Eye surgery    . Pars plana vitrectomy w/ repair of macular hole    . Back surgery      times  2  . Tonsillectomy    . Cervical cone biopsy    . Video bronchoscopy with endobronchial ultrasound N/A 04/07/2014    Procedure: VIDEO BRONCHOSCOPY WITH ENDOBRONCHIAL ULTRASOUND;  Surgeon: Melrose Nakayama, MD;  Location: [facility];  Service: Thoracic;  Laterality: N/A;    REVIEW OF SYSTEMS:  Review of Systems  Constitutional: Negative for fever, chills, weight loss, malaise/fatigue and diaphoresis.  HENT: Negative for congestion, ear discharge, ear pain, hearing loss, nosebleeds, sore throat and tinnitus.   Eyes: Negative for blurred vision, double vision, photophobia, pain, discharge and redness.  Respiratory: Positive for cough. Negative for hemoptysis, sputum production, shortness of breath, wheezing and stridor.   Cardiovascular: Negative for chest pain, palpitations, orthopnea, claudication, leg swelling and PND.  Gastrointestinal: Positive for heartburn. Negative for nausea, vomiting, abdominal pain, diarrhea, constipation, blood in stool and melena.       Recent reflux/heartburn symptoms  Genitourinary: Negative.  Negative for dysuria, urgency, frequency, hematuria and flank pain.  Musculoskeletal: Negative.   Skin: Negative.   Neurological: Negative for dizziness, tingling, focal weakness, seizures, weakness and headaches.  Endo/Heme/Allergies: Does not bruise/bleed easily.  Psychiatric/Behavioral: Negative for depression. The patient has insomnia. The patient is not nervous/anxious.      PHYSICAL EXAMINATION: Physical Exam  Constitutional: She is oriented to person, place, and time and well-developed, well-nourished, and in no distress.  HENT:  Head: Normocephalic and atraumatic.  Mouth/Throat: Oropharynx is clear and moist.  Eyes: Pupils are equal, round, and reactive to light.  Neck: Normal range of motion. Neck supple. No JVD present. No tracheal deviation present. No thyromegaly present.  Cardiovascular: Normal rate, regular rhythm, normal heart sounds and intact distal  pulses.  Exam reveals no gallop and no friction rub.   No murmur heard. Pulmonary/Chest: Effort normal and breath sounds normal. No respiratory distress. She has no wheezes. She has no rales.  Abdominal: Soft. Bowel sounds are normal. She exhibits no distension and no mass. There is no tenderness.  Musculoskeletal: Normal range of motion. She exhibits no edema and no tenderness.  Lymphadenopathy:    She has no cervical adenopathy.  Neurological: She is alert and oriented to person, place, and time. She has normal reflexes. Gait normal.  Skin: Skin is warm and dry. No rash noted.    ECOG PERFORMANCE STATUS: 1 - Symptomatic but completely ambulatory  Blood pressure 151/82, pulse 111, temperature 98.6 F (37 C), temperature source Oral, resp. rate 21, height 5\' 8"  (1.727 m), weight 142 lb 4.8 oz (64.547 kg), SpO2 99.00%.  LABORATORY DATA: Lab Results  Component Value Date   WBC 5.1 05/19/2014  HGB 11.9 05/19/2014   HCT 36.7 05/19/2014   MCV 90.4 05/19/2014   PLT 354 05/19/2014      Chemistry      Component Value Date/Time   NA 140 05/19/2014 0957   NA 141 04/05/2014 1336   K 4.5 05/19/2014 0957   K 4.5 04/05/2014 1336   CL 104 04/05/2014 1336   CO2 22 05/19/2014 0957   CO2 20 04/05/2014 1336   BUN 13.8 05/19/2014 0957   BUN 12 04/05/2014 1336   CREATININE 0.7 05/19/2014 0957   CREATININE 0.46* 04/05/2014 1336      Component Value Date/Time   CALCIUM 10.0 05/19/2014 0957   CALCIUM 9.8 04/05/2014 1336   ALKPHOS 125 05/19/2014 0957   ALKPHOS 197* 04/05/2014 1336   AST 13 05/19/2014 0957   AST 18 04/05/2014 1336   ALT 23 05/19/2014 0957   ALT 33 04/05/2014 1336   BILITOT 0.55 05/19/2014 0957   BILITOT 0.3 04/05/2014 1336       RADIOGRAPHIC STUDIES:  Mr Jeri Cos Wo Contrast  04/19/2014   CLINICAL DATA:  68 year old female newly diagnosed right lung mass with headache. Staging. Subsequent encounter.  EXAM: MRI HEAD WITHOUT AND WITH CONTRAST  TECHNIQUE: Multiplanar, multiecho pulse  sequences of the brain and surrounding structures were obtained without and with intravenous contrast.  CONTRAST:  93mL MULTIHANCE GADOBENATE DIMEGLUMINE 529 MG/ML IV SOLN  COMPARISON:  PET-CT 04/19/2014.  FINDINGS: No abnormal enhancement identified. No midline shift, mass effect, or evidence of intracranial mass lesion.  Cerebral volume is within normal limits for age. No restricted diffusion to suggest acute infarction. No midline ventriculomegaly, extra-axial collection or acute intracranial hemorrhage. Cervicomedullary junction and pituitary are within normal limits. Negative visualized cervical spine. Major intracranial vascular flow voids are preserved.  Patchy bilateral nonspecific cerebral white matter T2 and FLAIR hyperintensity, moderate for age. No cortical encephalomalacia. More mild patchy T2 hyperintensity in the pons. Visible internal auditory structures appear normal.  Trace right mastoid fluid. Small right nasopharyngeal retention cyst. Other Visualized paranasal sinuses and mastoids are clear. Postoperative changes to the right globe. Bone marrow signal within normal limits. Visualized scalp soft tissues are within normal limits.  IMPRESSION: 1.  No acute or metastatic intracranial abnormality. 2. Moderate for age nonspecific signal changes, most commonly due to chronic small vessel disease.   Electronically Signed   By: Lars Pinks M.D.   On: 04/19/2014 12:40   Nm Pet Image Initial (pi) Skull Base To Thigh  04/19/2014   CLINICAL DATA:  Initial treatment strategy for right lower lobe small cell lung cancer.  EXAM: NUCLEAR MEDICINE PET SKULL BASE TO THIGH  TECHNIQUE: 7.0 mCi F-18 FDG was injected intravenously. Full-ring PET imaging was performed from the skull base to thigh after the radiotracer. CT data was obtained and used for attenuation correction and anatomic localization.  FASTING BLOOD GLUCOSE:  Value: 92 mg/dl  COMPARISON:  Chest CT 03/23/2014.  FINDINGS: NECK  No areas of abnormal  hypermetabolism.  CHEST  Ill-defined right upper lobe nodular density measures 6 mm and a S.U.V. max of 2.3 on image 35. Subtly apparent on image 27 axial and image 34 sagittal of the prior CT.  Hypermetabolic mediastinal adenopathy. A lower right paratracheal node measures 1.1 cm and a S.U.V. max of 3.6 on image 70. Subcarinal/azygoesophageal recess node measures 9 mm and a S.U.V. max of 4.4 on image 74.  Right hilar hypermetabolism which corresponds to mild adenopathy. This measures 1.7 cm and a S.U.V. max of  4.2 on image 77.  The presumed primary is centered lateral to the inferior aspect of the bronchus intermedius and origin of the right lower lobe bronchi. 1.8 x 1.8 cm and a S.U.V. max of 11.6 on image 83.  More peripheral right lower lobe patchy airspace disease is slightly decreased and corresponds to low-level hypermetabolism. Favored to represent postobstructive pneumonitis.  ABDOMEN/PELVIS  No areas of abnormal hypermetabolism.  SKELETON  No abnormal marrow activity.  CT IMAGES PERFORMED FOR ATTENUATION CORRECTION  No cervical adenopathy. Carotid atherosclerosis. Aortic and branch vessel atherosclerosis. Multivessel coronary artery atherosclerosis. Mild cardiomegaly. Bilateral breast implants. New patchy left lower lobe airspace disease is suspicious for infection. This is mild. No well-defined adrenal nodule. Convex right lumbar spine curvature.  IMPRESSION: 1. Primary bronchogenic carcinoma centered lateral to the origin of the right lower lobe bronchus. Mediastinal and right hilar hypermetabolic nodes, highly suspicious for metastatic disease. A hypermetabolic right upper lobe pulmonary nodule is suspicious for pulmonary metastasis versus a synchronous primary bronchogenic carcinoma. 2. Improved right lower lobe aeration. Persistent patchy hypermetabolic opacity which is likely related to postobstructive pneumonitis. 3. No evidence of extrathoracic metastasis. 4.  Atherosclerosis, including within the  coronary arteries. 5. New mild left lower lobe airspace disease which is suspicious for infection.   Electronically Signed   By: Abigail Miyamoto M.D.   On: 04/19/2014 10:40     ASSESSMENT/PLAN:  No problem-specific assessment & plan notes found for this encounter.  patient is a pleasant 68 year old Caucasian female recently diagnosed with stage IIIa (T3, N2, M0) non-small cell lung cancer, squamous cell carcinoma diagnosed September 2015 presenting with central right lower lobe lung mass with postobstructive pneumonitis and mediastinal lymph nodes. There is also a suspicious small node in the right upper lobe. She's currently being treated a course of concurrent chemoradiation with weekly chemotherapy with carboplatin for AUC of 2 and paclitaxel 45 mg per meter squared for a total of 6-7 weeks depending on the final dose radiation. She is currently scheduled to complete her last fraction of radiation on 06/09/2014. Patient was discussed with also seen by Dr. Julien Nordmann. She will continue with her course of concurrent chemoradiation as scheduled. She'll followup in 2 weeks for another symptom management visit.  All questions were answered. The patient knows to call the clinic with any problems, questions or concerns. We can certainly see the patient much sooner if necessary.  Mikey Bussing, DNP, AGPCNP-BC 05/19/2014  ADDENDUM:  Hematology/Oncology Attending:  I had a face to face encounter with the patient. I recommended her care plan. This is a very pleasant 68 years old white female with a stage IIIa non-small cell lung cancer, squamous cell carcinoma currently undergoing concurrent chemoradiation with weekly carboplatin and paclitaxel is status post 3 cycles. She is tolerating her treatment fairly well with no significant adverse effects except for mild sore throat radiation induced. I recommended for the patient to proceed with cycle #4 next week as scheduled. She would come back for followup visit in  2 weeks for reevaluation and management any adverse effect of her treatment. She was advised to call immediately if she has any concerning symptoms in the interval.  Disclaimer: This note was dictated with voice recognition software. Similar sounding words can inadvertently be transcribed and may be missed upon review. Eilleen Kempf., MD 05/23/2014

## 2014-05-19 NOTE — Telephone Encounter (Signed)
s.w pt and advised on NOV appt....pt ok adn aware °

## 2014-05-20 LAB — URINE CULTURE

## 2014-05-22 ENCOUNTER — Encounter: Payer: Self-pay | Admitting: *Deleted

## 2014-05-22 ENCOUNTER — Ambulatory Visit (HOSPITAL_BASED_OUTPATIENT_CLINIC_OR_DEPARTMENT_OTHER): Payer: Medicare Other

## 2014-05-22 ENCOUNTER — Ambulatory Visit
Admission: RE | Admit: 2014-05-22 | Discharge: 2014-05-22 | Disposition: A | Discharge status: Home / Self Care | Payer: Medicare Other | Source: Ambulatory Visit | Attending: Radiation Oncology | Admitting: Radiation Oncology

## 2014-05-22 ENCOUNTER — Other Ambulatory Visit: Payer: Medicare Other

## 2014-05-22 VITALS — BP 111/72 | HR 96 | Temp 98.7°F | Resp 18 | Ht 68.0 in

## 2014-05-22 DIAGNOSIS — Z5111 Encounter for antineoplastic chemotherapy: Secondary | ICD-10-CM

## 2014-05-22 DIAGNOSIS — Z510 Encounter for antineoplastic radiation therapy: Secondary | ICD-10-CM | POA: Diagnosis not present

## 2014-05-22 DIAGNOSIS — C3491 Malignant neoplasm of unspecified part of right bronchus or lung: Secondary | ICD-10-CM

## 2014-05-22 DIAGNOSIS — C3431 Malignant neoplasm of lower lobe, right bronchus or lung: Secondary | ICD-10-CM

## 2014-05-22 MED ORDER — DIPHENHYDRAMINE HCL 50 MG/ML IJ SOLN
50.0000 mg | Freq: Once | INTRAMUSCULAR | Status: AC
  Administered 2014-05-22: 50 mg via INTRAVENOUS

## 2014-05-22 MED ORDER — FAMOTIDINE IN NACL 20-0.9 MG/50ML-% IV SOLN
INTRAVENOUS | Status: AC
  Filled 2014-05-22: qty 50

## 2014-05-22 MED ORDER — FAMOTIDINE IN NACL 20-0.9 MG/50ML-% IV SOLN
20.0000 mg | Freq: Once | INTRAVENOUS | Status: AC
  Administered 2014-05-22: 20 mg via INTRAVENOUS

## 2014-05-22 MED ORDER — SODIUM CHLORIDE 0.9 % IV SOLN
Freq: Once | INTRAVENOUS | Status: AC
  Administered 2014-05-22: 14:00:00 via INTRAVENOUS

## 2014-05-22 MED ORDER — HEPARIN SOD (PORK) LOCK FLUSH 100 UNIT/ML IV SOLN
500.0000 [IU] | Freq: Once | INTRAVENOUS | Status: DC | PRN
  Filled 2014-05-22: qty 5

## 2014-05-22 MED ORDER — PACLITAXEL CHEMO INJECTION 300 MG/50ML
45.0000 mg/m2 | Freq: Once | INTRAVENOUS | Status: AC
  Administered 2014-05-22: 78 mg via INTRAVENOUS
  Filled 2014-05-22: qty 13

## 2014-05-22 MED ORDER — SODIUM CHLORIDE 0.9 % IV SOLN
167.0000 mg | Freq: Once | INTRAVENOUS | Status: AC
  Administered 2014-05-22: 170 mg via INTRAVENOUS
  Filled 2014-05-22: qty 17

## 2014-05-22 MED ORDER — DEXAMETHASONE SODIUM PHOSPHATE 20 MG/5ML IJ SOLN
20.0000 mg | Freq: Once | INTRAMUSCULAR | Status: AC
  Administered 2014-05-22: 20 mg via INTRAVENOUS

## 2014-05-22 MED ORDER — ONDANSETRON 16 MG/50ML IVPB (CHCC)
16.0000 mg | Freq: Once | INTRAVENOUS | Status: AC
  Administered 2014-05-22: 16 mg via INTRAVENOUS

## 2014-05-22 MED ORDER — SODIUM CHLORIDE 0.9 % IJ SOLN
10.0000 mL | INTRAMUSCULAR | Status: DC | PRN
  Filled 2014-05-22: qty 10

## 2014-05-22 MED ORDER — DIPHENHYDRAMINE HCL 50 MG/ML IJ SOLN
INTRAMUSCULAR | Status: AC
  Filled 2014-05-22: qty 1

## 2014-05-22 MED ORDER — ONDANSETRON 16 MG/50ML IVPB (CHCC)
INTRAVENOUS | Status: AC
  Filled 2014-05-22: qty 16

## 2014-05-22 MED ORDER — DEXAMETHASONE SODIUM PHOSPHATE 20 MG/5ML IJ SOLN
INTRAMUSCULAR | Status: AC
  Filled 2014-05-22: qty 5

## 2014-05-22 NOTE — Patient Instructions (Signed)
[facility] Discharge Instructions for Patients Receiving Chemotherapy  Today you received the following chemotherapy agents Taxol and Carboplatin.  To help prevent nausea and vomiting after your treatment, we encourage you to take your nausea medication.   If you develop nausea and vomiting that is not controlled by your nausea medication, call the clinic.   BELOW ARE SYMPTOMS THAT SHOULD BE REPORTED IMMEDIATELY:  *FEVER GREATER THAN 100.5 F  *CHILLS WITH OR WITHOUT FEVER  NAUSEA AND VOMITING THAT IS NOT CONTROLLED WITH YOUR NAUSEA MEDICATION  *UNUSUAL SHORTNESS OF BREATH  *UNUSUAL BRUISING OR BLEEDING  TENDERNESS IN MOUTH AND THROAT WITH OR WITHOUT PRESENCE OF ULCERS  *URINARY PROBLEMS  *BOWEL PROBLEMS  UNUSUAL RASH Items with * indicate a potential emergency and should be followed up as soon as possible.  Feel free to call the clinic you have any questions or concerns. The clinic phone number is [phone].    

## 2014-05-23 ENCOUNTER — Ambulatory Visit
Admission: RE | Admit: 2014-05-23 | Discharge: 2014-05-23 | Disposition: A | Discharge status: Home / Self Care | Payer: Medicare Other | Source: Ambulatory Visit | Attending: Radiation Oncology | Admitting: Radiation Oncology

## 2014-05-23 VITALS — BP 133/80 | HR 98 | Temp 98.3°F | Wt 146.1 lb

## 2014-05-23 DIAGNOSIS — Z510 Encounter for antineoplastic radiation therapy: Secondary | ICD-10-CM | POA: Diagnosis not present

## 2014-05-23 DIAGNOSIS — C3491 Malignant neoplasm of unspecified part of right bronchus or lung: Secondary | ICD-10-CM

## 2014-05-23 NOTE — Progress Notes (Signed)
Weekly  Assessment of radiation to right lower lobe and hilum for NSCLCA Completed 17 of 30 treatments..Denies pain.Itchy  Follicular rash anterior and posterior chest wall.Continue application of biafine.Patient does get concurrent chemotherapy.More concern for husband whom had a mini stroke last week.He looks unstable so we have provided a wheelchair for transport.

## 2014-05-23 NOTE — Progress Notes (Signed)
  Radiation Oncology         [phone] ________________________________  Name: [name] MRN: [id number]  Date: 05/23/2014  DOB: Dec 31, 1945  Weekly Radiation Therapy Management  Non-small cell cancer of right lung   Primary site: Lung   Staging method: AJCC 7th Edition   Clinical: Stage IIIA (T3, N2, M0) signed by Thea Silversmith, MD on 04/20/2014  5:57 PM   Summary: Stage IIIA (T3, N2, M0)     Current Dose: 34 Gy     Planned Dose:  60 Gy  Narrative . . . . . . . . The patient presents for routine under treatment assessment.                                   The patient is without complaint. She had some mild esophageal symptoms controlled well with her Magic mouthwash and hycet.                                 Set-up films were reviewed.                                 The chart was checked. Physical Findings. . .  weight is 146 lb 1.6 oz (66.271 kg). Her temperature is 98.3 F (36.8 C). Her blood pressure is 133/80 and her pulse is 98. Her oxygen saturation is 97%. . The lungs are clear. The heart has a regular rhythm and rate. Patient does have some dermatitis in the upper sternum area but no skin breakdown Impression . . . . . . . The patient is tolerating radiation. Plan . . . . . . . . . . . . Continue treatment as planned.  ________________________________   Blair Promise, PhD, MD

## 2014-05-24 ENCOUNTER — Ambulatory Visit
Admission: RE | Admit: 2014-05-24 | Discharge: 2014-05-24 | Disposition: A | Discharge status: Home / Self Care | Payer: Medicare Other | Source: Ambulatory Visit | Attending: Radiation Oncology | Admitting: Radiation Oncology

## 2014-05-24 DIAGNOSIS — Z510 Encounter for antineoplastic radiation therapy: Secondary | ICD-10-CM | POA: Diagnosis not present

## 2014-05-25 ENCOUNTER — Ambulatory Visit
Admission: RE | Admit: 2014-05-25 | Discharge: 2014-05-25 | Disposition: A | Discharge status: Home / Self Care | Payer: Medicare Other | Source: Ambulatory Visit | Attending: Radiation Oncology | Admitting: Radiation Oncology

## 2014-05-25 DIAGNOSIS — Z510 Encounter for antineoplastic radiation therapy: Secondary | ICD-10-CM | POA: Diagnosis not present

## 2014-05-26 ENCOUNTER — Ambulatory Visit
Admission: RE | Admit: 2014-05-26 | Discharge: 2014-05-26 | Disposition: A | Discharge status: Home / Self Care | Payer: Medicare Other | Source: Ambulatory Visit | Attending: Radiation Oncology | Admitting: Radiation Oncology

## 2014-05-26 DIAGNOSIS — C3491 Malignant neoplasm of unspecified part of right bronchus or lung: Secondary | ICD-10-CM

## 2014-05-26 DIAGNOSIS — Z510 Encounter for antineoplastic radiation therapy: Secondary | ICD-10-CM | POA: Diagnosis not present

## 2014-05-26 MED ORDER — RADIAPLEXRX EX GEL
Freq: Once | CUTANEOUS | Status: AC
  Administered 2014-05-26: 11:00:00 via TOPICAL

## 2014-05-29 ENCOUNTER — Other Ambulatory Visit (HOSPITAL_BASED_OUTPATIENT_CLINIC_OR_DEPARTMENT_OTHER): Payer: Medicare Other

## 2014-05-29 ENCOUNTER — Ambulatory Visit
Admission: RE | Admit: 2014-05-29 | Discharge: 2014-05-29 | Disposition: A | Discharge status: Home / Self Care | Payer: Medicare Other | Source: Ambulatory Visit | Attending: Radiation Oncology | Admitting: Radiation Oncology

## 2014-05-29 ENCOUNTER — Ambulatory Visit (HOSPITAL_BASED_OUTPATIENT_CLINIC_OR_DEPARTMENT_OTHER): Payer: Medicare Other

## 2014-05-29 DIAGNOSIS — C3491 Malignant neoplasm of unspecified part of right bronchus or lung: Secondary | ICD-10-CM

## 2014-05-29 DIAGNOSIS — C3431 Malignant neoplasm of lower lobe, right bronchus or lung: Secondary | ICD-10-CM

## 2014-05-29 DIAGNOSIS — Z510 Encounter for antineoplastic radiation therapy: Secondary | ICD-10-CM | POA: Diagnosis not present

## 2014-05-29 DIAGNOSIS — Z5111 Encounter for antineoplastic chemotherapy: Secondary | ICD-10-CM

## 2014-05-29 LAB — CBC WITH DIFFERENTIAL/PLATELET
BASO%: 1.2 % (ref 0.0–2.0)
Basophils Absolute: 0 10*3/uL (ref 0.0–0.1)
EOS%: 3.1 % (ref 0.0–7.0)
Eosinophils Absolute: 0.1 10*3/uL (ref 0.0–0.5)
HCT: 38.5 % (ref 34.8–46.6)
HGB: 12.4 g/dL (ref 11.6–15.9)
LYMPH%: 16.6 % (ref 14.0–49.7)
MCH: 29.1 pg (ref 25.1–34.0)
MCHC: 32.3 g/dL (ref 31.5–36.0)
MCV: 90 fL (ref 79.5–101.0)
MONO#: 0.4 10*3/uL (ref 0.1–0.9)
MONO%: 11.5 % (ref 0.0–14.0)
NEUT#: 2.6 10*3/uL (ref 1.5–6.5)
NEUT%: 67.6 % (ref 38.4–76.8)
Platelets: 253 10*3/uL (ref 145–400)
RBC: 4.27 10*6/uL (ref 3.70–5.45)
RDW: 14.8 % — AB (ref 11.2–14.5)
WBC: 3.8 10*3/uL — ABNORMAL LOW (ref 3.9–10.3)
lymph#: 0.6 10*3/uL — ABNORMAL LOW (ref 0.9–3.3)

## 2014-05-29 LAB — COMPREHENSIVE METABOLIC PANEL (CC13)
ALBUMIN: 3.4 g/dL — AB (ref 3.5–5.0)
ALK PHOS: 117 U/L (ref 40–150)
ALT: 28 U/L (ref 0–55)
AST: 19 U/L (ref 5–34)
Anion Gap: 9 mEq/L (ref 3–11)
BUN: 11 mg/dL (ref 7.0–26.0)
CO2: 22 mEq/L (ref 22–29)
Calcium: 9.8 mg/dL (ref 8.4–10.4)
Chloride: 108 mEq/L (ref 98–109)
Creatinine: 0.7 mg/dL (ref 0.6–1.1)
Glucose: 96 mg/dl (ref 70–140)
POTASSIUM: 4.4 meq/L (ref 3.5–5.1)
SODIUM: 138 meq/L (ref 136–145)
TOTAL PROTEIN: 6.7 g/dL (ref 6.4–8.3)
Total Bilirubin: 0.26 mg/dL (ref 0.20–1.20)

## 2014-05-29 MED ORDER — FAMOTIDINE IN NACL 20-0.9 MG/50ML-% IV SOLN
INTRAVENOUS | Status: AC
  Filled 2014-05-29: qty 50

## 2014-05-29 MED ORDER — ONDANSETRON 16 MG/50ML IVPB (CHCC)
INTRAVENOUS | Status: AC
  Filled 2014-05-29: qty 16

## 2014-05-29 MED ORDER — PACLITAXEL CHEMO INJECTION 300 MG/50ML
45.0000 mg/m2 | Freq: Once | INTRAVENOUS | Status: AC
  Administered 2014-05-29: 78 mg via INTRAVENOUS
  Filled 2014-05-29: qty 13

## 2014-05-29 MED ORDER — CARBOPLATIN CHEMO INJECTION 450 MG/45ML
167.0000 mg | Freq: Once | INTRAVENOUS | Status: AC
  Administered 2014-05-29: 170 mg via INTRAVENOUS
  Filled 2014-05-29: qty 17

## 2014-05-29 MED ORDER — ONDANSETRON 16 MG/50ML IVPB (CHCC)
16.0000 mg | Freq: Once | INTRAVENOUS | Status: AC
  Administered 2014-05-29: 16 mg via INTRAVENOUS

## 2014-05-29 MED ORDER — DEXAMETHASONE SODIUM PHOSPHATE 20 MG/5ML IJ SOLN
INTRAMUSCULAR | Status: AC
  Filled 2014-05-29: qty 5

## 2014-05-29 MED ORDER — DIPHENHYDRAMINE HCL 50 MG/ML IJ SOLN
50.0000 mg | Freq: Once | INTRAMUSCULAR | Status: AC
  Administered 2014-05-29: 50 mg via INTRAVENOUS

## 2014-05-29 MED ORDER — SODIUM CHLORIDE 0.9 % IV SOLN
Freq: Once | INTRAVENOUS | Status: AC
  Administered 2014-05-29: 11:00:00 via INTRAVENOUS

## 2014-05-29 MED ORDER — DEXAMETHASONE SODIUM PHOSPHATE 20 MG/5ML IJ SOLN
20.0000 mg | Freq: Once | INTRAMUSCULAR | Status: AC
  Administered 2014-05-29: 20 mg via INTRAVENOUS

## 2014-05-29 MED ORDER — DIPHENHYDRAMINE HCL 50 MG/ML IJ SOLN
INTRAMUSCULAR | Status: AC
  Filled 2014-05-29: qty 1

## 2014-05-29 MED ORDER — FAMOTIDINE IN NACL 20-0.9 MG/50ML-% IV SOLN
20.0000 mg | Freq: Once | INTRAVENOUS | Status: AC
  Administered 2014-05-29: 20 mg via INTRAVENOUS

## 2014-05-29 NOTE — Patient Instructions (Signed)
[facility] Discharge Instructions for Patients Receiving Chemotherapy  Today you received the following chemotherapy agents: Taxol and Carboplatin.  To help prevent nausea and vomiting after your treatment, we encourage you to take your nausea medication, Compazine. Take one every six hours as needed.   If you develop nausea and vomiting that is not controlled by your nausea medication, call the clinic.   BELOW ARE SYMPTOMS THAT SHOULD BE REPORTED IMMEDIATELY:  *FEVER GREATER THAN 100.5 F  *CHILLS WITH OR WITHOUT FEVER  NAUSEA AND VOMITING THAT IS NOT CONTROLLED WITH YOUR NAUSEA MEDICATION  *UNUSUAL SHORTNESS OF BREATH  *UNUSUAL BRUISING OR BLEEDING  TENDERNESS IN MOUTH AND THROAT WITH OR WITHOUT PRESENCE OF ULCERS  *URINARY PROBLEMS  *BOWEL PROBLEMS  UNUSUAL RASH Items with * indicate a potential emergency and should be followed up as soon as possible.  Feel free to call the clinic should you have any questions or concerns. The clinic phone number is [phone].

## 2014-05-30 ENCOUNTER — Ambulatory Visit: Admission: RE | Admit: 2014-05-30 | Payer: Medicare Other | Source: Ambulatory Visit | Admitting: Radiation Oncology

## 2014-05-30 ENCOUNTER — Ambulatory Visit: Payer: Medicare Other

## 2014-05-31 ENCOUNTER — Encounter: Payer: Self-pay | Admitting: Radiation Oncology

## 2014-05-31 ENCOUNTER — Ambulatory Visit
Admission: RE | Admit: 2014-05-31 | Discharge: 2014-05-31 | Disposition: A | Discharge status: Home / Self Care | Payer: Medicare Other | Source: Ambulatory Visit | Attending: Radiation Oncology | Admitting: Radiation Oncology

## 2014-05-31 VITALS — BP 140/83 | HR 96 | Resp 16 | Wt 144.7 lb

## 2014-05-31 DIAGNOSIS — Z510 Encounter for antineoplastic radiation therapy: Secondary | ICD-10-CM | POA: Diagnosis not present

## 2014-05-31 DIAGNOSIS — C3491 Malignant neoplasm of unspecified part of right bronchus or lung: Secondary | ICD-10-CM

## 2014-05-31 MED ORDER — HYDROCODONE-HOMATROPINE 5-1.5 MG/5ML PO SYRP: 5.0000 mL | ORAL_SOLUTION | Freq: Four times a day (QID) | ORAL | Status: DC | PRN

## 2014-05-31 NOTE — Progress Notes (Signed)
Patient presents with husband for PUT. Patient did not receive treatment yesterday because machine was down. Patient requesting Hycodan be refilled for "coughing spells." Weight and vitals stable. Reports intermittent discomfort with swallowing. Reports skin has greatly improved since switching from biafine to radiaplex. Reports shortness of breath with exertion. Denies nausea or vomiting. Questions if she should continue taking provera and estrodial. Questions results of CT done Friday. Reports she had chemo on Monday.

## 2014-05-31 NOTE — Progress Notes (Signed)
Weekly Management Note Current Dose: 44 Gy  Projected Dose: 60 Gy   Narrative:  The patient presents for routine under treatment assessment.  CBCT/MVCT images/Port film x-rays were reviewed.  The chart was checked. OK. Swallowing comes and goes. No issues today. Cough is slightly improved. No worsening shortness of breath.   Physical Findings:  Dermatitis in upper sternum area. No desquamation.   Vitals:  Filed Vitals:   05/31/14 1036  BP: 140/83  Pulse: 96  Resp: 16   Weight:  Wt Readings from Last 3 Encounters:  05/31/14 144 lb 11.2 oz (65.635 kg)  05/23/14 146 lb 1.6 oz (66.271 kg)  05/19/14 142 lb 4.8 oz (64.547 kg)   Lab Results  Component Value Date   WBC 3.8* 05/29/2014   HGB 12.4 05/29/2014   HCT 38.5 05/29/2014   MCV 90.0 05/29/2014   PLT 253 05/29/2014   Lab Results  Component Value Date   CREATININE 0.7 05/29/2014   BUN 11.0 05/29/2014   NA 138 05/29/2014   K 4.4 05/29/2014   CL 104 04/05/2014   CO2 22 05/29/2014     Impression:  The patient is tolerating radiation.  Plan:  Continue treatment as planned. Weight is stable so continue current meds. Refilled hycodan.

## 2014-06-01 ENCOUNTER — Ambulatory Visit
Admission: RE | Admit: 2014-06-01 | Discharge: 2014-06-01 | Disposition: A | Discharge status: Home / Self Care | Payer: Medicare Other | Source: Ambulatory Visit | Attending: Radiation Oncology | Admitting: Radiation Oncology

## 2014-06-01 DIAGNOSIS — Z510 Encounter for antineoplastic radiation therapy: Secondary | ICD-10-CM | POA: Diagnosis not present

## 2014-06-02 ENCOUNTER — Other Ambulatory Visit: Payer: Medicare Other

## 2014-06-02 ENCOUNTER — Telehealth: Payer: Self-pay | Admitting: Internal Medicine

## 2014-06-02 ENCOUNTER — Ambulatory Visit: Admission: RE | Admit: 2014-06-02 | Payer: Medicare Other | Source: Ambulatory Visit

## 2014-06-02 ENCOUNTER — Ambulatory Visit: Payer: Medicare Other | Admitting: Physician Assistant

## 2014-06-02 NOTE — Telephone Encounter (Signed)
pt called to r/s lab...done...pt aware of new d.t

## 2014-06-05 ENCOUNTER — Other Ambulatory Visit (HOSPITAL_BASED_OUTPATIENT_CLINIC_OR_DEPARTMENT_OTHER): Payer: Medicare Other

## 2014-06-05 ENCOUNTER — Ambulatory Visit
Admission: RE | Admit: 2014-06-05 | Discharge: 2014-06-05 | Disposition: A | Discharge status: Home / Self Care | Payer: Medicare Other | Source: Ambulatory Visit | Attending: Radiation Oncology | Admitting: Radiation Oncology

## 2014-06-05 ENCOUNTER — Ambulatory Visit (HOSPITAL_BASED_OUTPATIENT_CLINIC_OR_DEPARTMENT_OTHER): Payer: Medicare Other

## 2014-06-05 ENCOUNTER — Other Ambulatory Visit: Payer: Medicare Other

## 2014-06-05 DIAGNOSIS — C3491 Malignant neoplasm of unspecified part of right bronchus or lung: Secondary | ICD-10-CM

## 2014-06-05 DIAGNOSIS — Z510 Encounter for antineoplastic radiation therapy: Secondary | ICD-10-CM | POA: Diagnosis not present

## 2014-06-05 DIAGNOSIS — C3431 Malignant neoplasm of lower lobe, right bronchus or lung: Secondary | ICD-10-CM

## 2014-06-05 DIAGNOSIS — Z5111 Encounter for antineoplastic chemotherapy: Secondary | ICD-10-CM

## 2014-06-05 LAB — CBC WITH DIFFERENTIAL/PLATELET
BASO%: 0.9 % (ref 0.0–2.0)
BASOS ABS: 0 10*3/uL (ref 0.0–0.1)
EOS ABS: 0.1 10*3/uL (ref 0.0–0.5)
EOS%: 1.4 % (ref 0.0–7.0)
HCT: 39.9 % (ref 34.8–46.6)
HEMOGLOBIN: 13.4 g/dL (ref 11.6–15.9)
LYMPH%: 15.9 % (ref 14.0–49.7)
MCH: 29.9 pg (ref 25.1–34.0)
MCHC: 33.6 g/dL (ref 31.5–36.0)
MCV: 89.1 fL (ref 79.5–101.0)
MONO#: 0.4 10*3/uL (ref 0.1–0.9)
MONO%: 11 % (ref 0.0–14.0)
NEUT%: 70.8 % (ref 38.4–76.8)
NEUTROS ABS: 2.4 10*3/uL (ref 1.5–6.5)
PLATELETS: 221 10*3/uL (ref 145–400)
RBC: 4.48 10*6/uL (ref 3.70–5.45)
RDW: 14.8 % — ABNORMAL HIGH (ref 11.2–14.5)
WBC: 3.5 10*3/uL — AB (ref 3.9–10.3)
lymph#: 0.6 10*3/uL — ABNORMAL LOW (ref 0.9–3.3)

## 2014-06-05 LAB — COMPREHENSIVE METABOLIC PANEL (CC13)
ALK PHOS: 115 U/L (ref 40–150)
ALT: 28 U/L (ref 0–55)
ANION GAP: 11 meq/L (ref 3–11)
AST: 20 U/L (ref 5–34)
Albumin: 3.8 g/dL (ref 3.5–5.0)
BILIRUBIN TOTAL: 0.38 mg/dL (ref 0.20–1.20)
BUN: 14.8 mg/dL (ref 7.0–26.0)
CO2: 24 meq/L (ref 22–29)
Calcium: 10.2 mg/dL (ref 8.4–10.4)
Chloride: 105 mEq/L (ref 98–109)
Creatinine: 0.8 mg/dL (ref 0.6–1.1)
GLUCOSE: 128 mg/dL (ref 70–140)
Potassium: 4.5 mEq/L (ref 3.5–5.1)
SODIUM: 140 meq/L (ref 136–145)
TOTAL PROTEIN: 7.4 g/dL (ref 6.4–8.3)

## 2014-06-05 MED ORDER — DEXAMETHASONE SODIUM PHOSPHATE 20 MG/5ML IJ SOLN
INTRAMUSCULAR | Status: AC
  Filled 2014-06-05: qty 5

## 2014-06-05 MED ORDER — ONDANSETRON 16 MG/50ML IVPB (CHCC)
16.0000 mg | Freq: Once | INTRAVENOUS | Status: AC
  Administered 2014-06-05: 16 mg via INTRAVENOUS

## 2014-06-05 MED ORDER — ONDANSETRON 16 MG/50ML IVPB (CHCC)
INTRAVENOUS | Status: AC
  Filled 2014-06-05: qty 16

## 2014-06-05 MED ORDER — DIPHENHYDRAMINE HCL 50 MG/ML IJ SOLN
INTRAMUSCULAR | Status: AC
  Filled 2014-06-05: qty 1

## 2014-06-05 MED ORDER — SODIUM CHLORIDE 0.9 % IV SOLN
167.0000 mg | Freq: Once | INTRAVENOUS | Status: AC
  Administered 2014-06-05: 170 mg via INTRAVENOUS
  Filled 2014-06-05: qty 17

## 2014-06-05 MED ORDER — PACLITAXEL CHEMO INJECTION 300 MG/50ML
45.0000 mg/m2 | Freq: Once | INTRAVENOUS | Status: AC
  Administered 2014-06-05: 78 mg via INTRAVENOUS
  Filled 2014-06-05: qty 13

## 2014-06-05 MED ORDER — FAMOTIDINE IN NACL 20-0.9 MG/50ML-% IV SOLN
20.0000 mg | Freq: Once | INTRAVENOUS | Status: AC
  Administered 2014-06-05: 20 mg via INTRAVENOUS

## 2014-06-05 MED ORDER — DIPHENHYDRAMINE HCL 50 MG/ML IJ SOLN
50.0000 mg | Freq: Once | INTRAMUSCULAR | Status: AC
  Administered 2014-06-05: 50 mg via INTRAVENOUS

## 2014-06-05 MED ORDER — DEXAMETHASONE SODIUM PHOSPHATE 20 MG/5ML IJ SOLN
20.0000 mg | Freq: Once | INTRAMUSCULAR | Status: AC
  Administered 2014-06-05: 20 mg via INTRAVENOUS

## 2014-06-05 MED ORDER — FAMOTIDINE IN NACL 20-0.9 MG/50ML-% IV SOLN
INTRAVENOUS | Status: AC
Start: 2014-06-05 — End: 2014-06-05
  Filled 2014-06-05: qty 50

## 2014-06-05 MED ORDER — SODIUM CHLORIDE 0.9 % IV SOLN
Freq: Once | INTRAVENOUS | Status: AC
  Administered 2014-06-05: 11:00:00 via INTRAVENOUS

## 2014-06-05 NOTE — Patient Instructions (Signed)
[facility] Discharge Instructions for Patients Receiving Chemotherapy  Today you received the following chemotherapy agents: Taxol and Carboplatin.  To help prevent nausea and vomiting after your treatment, we encourage you to take your nausea medication: Compazine 10 mg every 6 hours as needed.   If you develop nausea and vomiting that is not controlled by your nausea medication, call the clinic.   BELOW ARE SYMPTOMS THAT SHOULD BE REPORTED IMMEDIATELY:  *FEVER GREATER THAN 100.5 F  *CHILLS WITH OR WITHOUT FEVER  NAUSEA AND VOMITING THAT IS NOT CONTROLLED WITH YOUR NAUSEA MEDICATION  *UNUSUAL SHORTNESS OF BREATH  *UNUSUAL BRUISING OR BLEEDING  TENDERNESS IN MOUTH AND THROAT WITH OR WITHOUT PRESENCE OF ULCERS  *URINARY PROBLEMS  *BOWEL PROBLEMS  UNUSUAL RASH Items with * indicate a potential emergency and should be followed up as soon as possible.  Feel free to call the clinic you have any questions or concerns. The clinic phone number is [phone].

## 2014-06-06 ENCOUNTER — Ambulatory Visit
Admission: RE | Admit: 2014-06-06 | Discharge: 2014-06-06 | Disposition: A | Discharge status: Home / Self Care | Payer: Medicare Other | Source: Ambulatory Visit | Attending: Radiation Oncology | Admitting: Radiation Oncology

## 2014-06-06 ENCOUNTER — Encounter: Payer: Self-pay | Admitting: Radiation Oncology

## 2014-06-06 VITALS — BP 155/102 | HR 112 | Temp 98.2°F | Wt 142.7 lb

## 2014-06-06 DIAGNOSIS — C3491 Malignant neoplasm of unspecified part of right bronchus or lung: Secondary | ICD-10-CM

## 2014-06-06 DIAGNOSIS — Z510 Encounter for antineoplastic radiation therapy: Secondary | ICD-10-CM | POA: Diagnosis not present

## 2014-06-06 MED ORDER — CYCLOBENZAPRINE HCL 5 MG PO TABS: 5.0000 mg | ORAL_TABLET | Freq: Three times a day (TID) | ORAL | Status: DC | PRN

## 2014-06-06 NOTE — Progress Notes (Signed)
Weekly assessment of radiation to right lower lung.Completed 25 of 30 treatments.shortness of breath with exertion.Today she is having pain of left shoulder blade.States she would like to have muscle relaxer.

## 2014-06-06 NOTE — Progress Notes (Signed)
Weekly Management Note Current Dose:50 Gy  Projected Dose:60 Gy   Narrative:  The patient presents for routine under treatment assessment.  CBCT/MVCT images/Port film x-rays were reviewed.  The chart was checked. Neck spasm since Friday. Has history of cervical neck disease and responds to muscle relaxer. Could not get ahold of PCP. Ask for refill. Felt better after massage yesterday. MMW x 1 for dysphagia.   Physical Findings:  Unchanged  Vitals:  Filed Vitals:   06/06/14 1018  BP: 155/102  Pulse: 112  Temp: 98.2 F (36.8 C)   Weight:  Wt Readings from Last 3 Encounters:  06/06/14 142 lb 11.2 oz (64.728 kg)  05/31/14 144 lb 11.2 oz (65.635 kg)  05/23/14 146 lb 1.6 oz (66.271 kg)   Lab Results  Component Value Date   WBC 3.5* 06/05/2014   HGB 13.4 06/05/2014   HCT 39.9 06/05/2014   MCV 89.1 06/05/2014   PLT 221 06/05/2014   Lab Results  Component Value Date   CREATININE 0.8 06/05/2014   BUN 14.8 06/05/2014   NA 140 06/05/2014   K 4.5 06/05/2014   CL 104 04/05/2014   CO2 24 06/05/2014     Impression:  The patient is tolerating radiation.  Plan:  Continue treatment as planned. Script for flexeril given.

## 2014-06-07 ENCOUNTER — Ambulatory Visit
Admission: RE | Admit: 2014-06-07 | Discharge: 2014-06-07 | Disposition: A | Discharge status: Home / Self Care | Payer: Medicare Other | Source: Ambulatory Visit | Attending: Radiation Oncology | Admitting: Radiation Oncology

## 2014-06-07 ENCOUNTER — Telehealth: Payer: Self-pay

## 2014-06-07 DIAGNOSIS — Z510 Encounter for antineoplastic radiation therapy: Secondary | ICD-10-CM | POA: Diagnosis not present

## 2014-06-07 NOTE — Telephone Encounter (Signed)
Received call from Sherri at [facility] that script for cyclobenaziprine was denied.Need to try different generic medication per tier such as (baclofen or tizanidine)If these abnormal clinical findings persist, appropriate workup will be completed. The patient understands that follow up is required to elucidate the situation. Further questions may call [phone].Spoke with patient today and she stated she was given generic for flexeril on yesterday.Continues to have significant pain of left neck radiating down left shoulder and arm.She will try taking morphine q6h and ibuprofen q8h and return to nursing on tomorrow for further assessment .

## 2014-06-08 ENCOUNTER — Ambulatory Visit
Admission: RE | Admit: 2014-06-08 | Discharge: 2014-06-08 | Disposition: A | Discharge status: Home / Self Care | Payer: Medicare Other | Source: Ambulatory Visit | Attending: Radiation Oncology | Admitting: Radiation Oncology

## 2014-06-08 DIAGNOSIS — Z510 Encounter for antineoplastic radiation therapy: Secondary | ICD-10-CM | POA: Diagnosis not present

## 2014-06-09 ENCOUNTER — Ambulatory Visit
Admission: RE | Admit: 2014-06-09 | Discharge: 2014-06-09 | Disposition: A | Discharge status: Home / Self Care | Payer: Medicare Other | Source: Ambulatory Visit | Attending: Radiation Oncology | Admitting: Radiation Oncology

## 2014-06-09 ENCOUNTER — Ambulatory Visit: Payer: Medicare Other

## 2014-06-09 DIAGNOSIS — Z510 Encounter for antineoplastic radiation therapy: Secondary | ICD-10-CM | POA: Diagnosis not present

## 2014-06-12 ENCOUNTER — Ambulatory Visit
Admission: RE | Admit: 2014-06-12 | Discharge: 2014-06-12 | Disposition: A | Discharge status: Home / Self Care | Payer: Medicare Other | Source: Ambulatory Visit | Attending: Radiation Oncology | Admitting: Radiation Oncology

## 2014-06-12 ENCOUNTER — Ambulatory Visit: Payer: Medicare Other

## 2014-06-12 ENCOUNTER — Ambulatory Visit (HOSPITAL_BASED_OUTPATIENT_CLINIC_OR_DEPARTMENT_OTHER): Payer: Medicare Other

## 2014-06-12 ENCOUNTER — Other Ambulatory Visit (HOSPITAL_BASED_OUTPATIENT_CLINIC_OR_DEPARTMENT_OTHER): Payer: Medicare Other

## 2014-06-12 VITALS — BP 118/91 | HR 118 | Temp 98.9°F | Resp 18

## 2014-06-12 DIAGNOSIS — Z510 Encounter for antineoplastic radiation therapy: Secondary | ICD-10-CM | POA: Diagnosis not present

## 2014-06-12 DIAGNOSIS — C3490 Malignant neoplasm of unspecified part of unspecified bronchus or lung: Secondary | ICD-10-CM

## 2014-06-12 DIAGNOSIS — C3431 Malignant neoplasm of lower lobe, right bronchus or lung: Secondary | ICD-10-CM

## 2014-06-12 DIAGNOSIS — Z23 Encounter for immunization: Secondary | ICD-10-CM

## 2014-06-12 DIAGNOSIS — C3491 Malignant neoplasm of unspecified part of right bronchus or lung: Secondary | ICD-10-CM

## 2014-06-12 DIAGNOSIS — Z5111 Encounter for antineoplastic chemotherapy: Secondary | ICD-10-CM

## 2014-06-12 LAB — CBC WITH DIFFERENTIAL/PLATELET
BASO%: 0.7 % (ref 0.0–2.0)
Basophils Absolute: 0 10*3/uL (ref 0.0–0.1)
EOS ABS: 0.1 10*3/uL (ref 0.0–0.5)
EOS%: 2.7 % (ref 0.0–7.0)
HCT: 37 % (ref 34.8–46.6)
HGB: 12.2 g/dL (ref 11.6–15.9)
LYMPH#: 0.5 10*3/uL — AB (ref 0.9–3.3)
LYMPH%: 18 % (ref 14.0–49.7)
MCH: 29.3 pg (ref 25.1–34.0)
MCHC: 33 g/dL (ref 31.5–36.0)
MCV: 88.9 fL (ref 79.5–101.0)
MONO#: 0.4 10*3/uL (ref 0.1–0.9)
MONO%: 13.9 % (ref 0.0–14.0)
NEUT%: 64.7 % (ref 38.4–76.8)
NEUTROS ABS: 1.9 10*3/uL (ref 1.5–6.5)
NRBC: 0 % (ref 0–0)
Platelets: 181 10*3/uL (ref 145–400)
RBC: 4.16 10*6/uL (ref 3.70–5.45)
RDW: 15.8 % — ABNORMAL HIGH (ref 11.2–14.5)
WBC: 3 10*3/uL — AB (ref 3.9–10.3)

## 2014-06-12 LAB — COMPREHENSIVE METABOLIC PANEL (CC13)
ALBUMIN: 3.6 g/dL (ref 3.5–5.0)
ALT: 32 U/L (ref 0–55)
AST: 23 U/L (ref 5–34)
Alkaline Phosphatase: 105 U/L (ref 40–150)
Anion Gap: 8 mEq/L (ref 3–11)
BUN: 10.9 mg/dL (ref 7.0–26.0)
CHLORIDE: 109 meq/L (ref 98–109)
CO2: 20 mEq/L — ABNORMAL LOW (ref 22–29)
CREATININE: 0.8 mg/dL (ref 0.6–1.1)
Calcium: 9.3 mg/dL (ref 8.4–10.4)
Glucose: 90 mg/dl (ref 70–140)
POTASSIUM: 4.3 meq/L (ref 3.5–5.1)
SODIUM: 137 meq/L (ref 136–145)
TOTAL PROTEIN: 6.9 g/dL (ref 6.4–8.3)
Total Bilirubin: 0.31 mg/dL (ref 0.20–1.20)

## 2014-06-12 MED ORDER — ONDANSETRON 16 MG/50ML IVPB (CHCC)
INTRAVENOUS | Status: AC
  Filled 2014-06-12: qty 16

## 2014-06-12 MED ORDER — DEXAMETHASONE SODIUM PHOSPHATE 20 MG/5ML IJ SOLN
20.0000 mg | Freq: Once | INTRAMUSCULAR | Status: AC
  Administered 2014-06-12: 20 mg via INTRAVENOUS

## 2014-06-12 MED ORDER — INFLUENZA VAC SPLIT QUAD 0.5 ML IM SUSY
0.5000 mL | PREFILLED_SYRINGE | Freq: Once | INTRAMUSCULAR | Status: AC
  Administered 2014-06-12: 0.5 mL via INTRAMUSCULAR
  Filled 2014-06-12: qty 0.5

## 2014-06-12 MED ORDER — FAMOTIDINE IN NACL 20-0.9 MG/50ML-% IV SOLN
20.0000 mg | Freq: Once | INTRAVENOUS | Status: AC
  Administered 2014-06-12: 20 mg via INTRAVENOUS

## 2014-06-12 MED ORDER — DIPHENHYDRAMINE HCL 50 MG/ML IJ SOLN
INTRAMUSCULAR | Status: AC
  Filled 2014-06-12: qty 1

## 2014-06-12 MED ORDER — FAMOTIDINE IN NACL 20-0.9 MG/50ML-% IV SOLN
INTRAVENOUS | Status: AC
  Filled 2014-06-12: qty 50

## 2014-06-12 MED ORDER — DIPHENHYDRAMINE HCL 50 MG/ML IJ SOLN
50.0000 mg | Freq: Once | INTRAMUSCULAR | Status: AC
  Administered 2014-06-12: 50 mg via INTRAVENOUS

## 2014-06-12 MED ORDER — SODIUM CHLORIDE 0.9 % IV SOLN
Freq: Once | INTRAVENOUS | Status: AC
  Administered 2014-06-12: 11:00:00 via INTRAVENOUS

## 2014-06-12 MED ORDER — SODIUM CHLORIDE 0.9 % IV SOLN
167.0000 mg | Freq: Once | INTRAVENOUS | Status: AC
  Administered 2014-06-12: 170 mg via INTRAVENOUS
  Filled 2014-06-12: qty 17

## 2014-06-12 MED ORDER — DEXAMETHASONE SODIUM PHOSPHATE 20 MG/5ML IJ SOLN
INTRAMUSCULAR | Status: AC
  Filled 2014-06-12: qty 5

## 2014-06-12 MED ORDER — DEXTROSE 5 % IV SOLN
45.0000 mg/m2 | Freq: Once | INTRAVENOUS | Status: AC
  Administered 2014-06-12: 78 mg via INTRAVENOUS
  Filled 2014-06-12: qty 13

## 2014-06-12 MED ORDER — ONDANSETRON 16 MG/50ML IVPB (CHCC)
16.0000 mg | Freq: Once | INTRAVENOUS | Status: AC
  Administered 2014-06-12: 16 mg via INTRAVENOUS

## 2014-06-12 NOTE — Patient Instructions (Signed)
[facility] Discharge Instructions for Patients Receiving Chemotherapy  Today you received the following chemotherapy agents taxol/carbo.  To help prevent nausea and vomiting after your treatment, we encourage you to take your nausea medication compazine 10 mg.   If you develop nausea and vomiting that is not controlled by your nausea medication, call the clinic.   BELOW ARE SYMPTOMS THAT SHOULD BE REPORTED IMMEDIATELY:  *FEVER GREATER THAN 100.5 F  *CHILLS WITH OR WITHOUT FEVER  NAUSEA AND VOMITING THAT IS NOT CONTROLLED WITH YOUR NAUSEA MEDICATION  *UNUSUAL SHORTNESS OF BREATH  *UNUSUAL BRUISING OR BLEEDING  TENDERNESS IN MOUTH AND THROAT WITH OR WITHOUT PRESENCE OF ULCERS  *URINARY PROBLEMS  *BOWEL PROBLEMS  UNUSUAL RASH Items with * indicate a potential emergency and should be followed up as soon as possible.  Feel free to call the clinic you have any questions or concerns. The clinic phone number is [phone].

## 2014-06-12 NOTE — Progress Notes (Signed)
D/C at 1426 with spouse, ambulatory in no distress.

## 2014-06-13 ENCOUNTER — Ambulatory Visit
Admission: RE | Admit: 2014-06-13 | Discharge: 2014-06-13 | Disposition: A | Discharge status: Home / Self Care | Payer: Medicare Other | Source: Ambulatory Visit | Attending: Radiation Oncology | Admitting: Radiation Oncology

## 2014-06-13 ENCOUNTER — Encounter: Payer: Self-pay | Admitting: Radiation Oncology

## 2014-06-13 ENCOUNTER — Ambulatory Visit: Payer: Medicare Other

## 2014-06-13 VITALS — BP 135/85 | HR 108 | Temp 98.3°F | Wt 151.2 lb

## 2014-06-13 DIAGNOSIS — Z510 Encounter for antineoplastic radiation therapy: Secondary | ICD-10-CM | POA: Insufficient documentation

## 2014-06-13 DIAGNOSIS — C3491 Malignant neoplasm of unspecified part of right bronchus or lung: Secondary | ICD-10-CM | POA: Diagnosis not present

## 2014-06-13 MED ORDER — MORPHINE SULFATE 15 MG PO TABS: ORAL_TABLET | ORAL | Status: DC

## 2014-06-13 MED ORDER — CYCLOBENZAPRINE HCL 5 MG PO TABS: 5.0000 mg | ORAL_TABLET | Freq: Three times a day (TID) | ORAL | Status: DC | PRN

## 2014-06-13 MED ORDER — RADIAPLEXRX EX GEL
Freq: Once | CUTANEOUS | Status: AC
  Administered 2014-06-13: 18:00:00 via TOPICAL

## 2014-06-13 NOTE — Progress Notes (Signed)
Weekly Management Note Current Dose:58 Gy  Projected Dose:60 Gy   Narrative:  The patient presents for routine under treatment assessment.  CBCT/MVCT images/Port film x-rays were reviewed.  The chart was checked. Doing ok. Back/neck better but needs refill on flexeril and morphine. Swallowing comes and goes. Appetite is good. No appt with med onc yet. Using radiaplex on rash.   Physical Findings:  Dermatitis medially and posterior back.  Vitals:  Filed Vitals:   06/13/14 1545  BP: 135/85  Pulse: 108  Temp: 98.3 F (36.8 C)   Weight:  Wt Readings from Last 3 Encounters:  06/13/14 151 lb 3.2 oz (68.584 kg)  06/06/14 142 lb 11.2 oz (64.728 kg)  05/31/14 144 lb 11.2 oz (65.635 kg)   Lab Results  Component Value Date   WBC 3.0* 06/12/2014   HGB 12.2 06/12/2014   HCT 37.0 06/12/2014   MCV 88.9 06/12/2014   PLT 181 06/12/2014   Lab Results  Component Value Date   CREATININE 0.8 06/12/2014   BUN 10.9 06/12/2014   NA 137 06/12/2014   K 4.3 06/12/2014   CL 104 04/05/2014   CO2 20* 06/12/2014     Impression:  The patient is tolerating radiation.  Plan:  Continue treatment as planned. Finishes tomorrow. Needs scn and med onc follow up for possible adjuvant chemo. Discussed that mucositis may get worse and to call with problems. Follow up in 1 month otherwise.

## 2014-06-13 NOTE — Addendum Note (Signed)
Encounter addended by: Arlyss Repress, RN on: 06/13/2014  5:34 PM<BR>     Documentation filed: Dx Association, Inpatient MAR, Orders

## 2014-06-13 NOTE — Progress Notes (Signed)
Patient here for weekly assessment of radiation to right lung.today is last treatment.Follicular rash to anterior and posterior chest.Continue application of radiaplex.Pain of left shoulder has improved to "4 to 6" on flexeril.scheduled to follow up on 07/14/14.

## 2014-06-14 ENCOUNTER — Other Ambulatory Visit: Payer: Self-pay | Admitting: Internal Medicine

## 2014-06-14 DIAGNOSIS — C3491 Malignant neoplasm of unspecified part of right bronchus or lung: Secondary | ICD-10-CM

## 2014-06-14 NOTE — Progress Notes (Signed)
  Radiation Oncology         [phone] ________________________________  Name: [name] MRN: [id number]  Date: 06/13/2014  DOB: [birth year]  End of Treatment Note  Diagnosis:   Stage III NSCLC    Indication for treatment:  Curative       Radiation treatment dates:   05/01/2014-06/13/2014  Site/dose:   Right lung, hilum and mediastinal nodes to 60 Gy at 2 Gy per fraction x 20 fractions  Beams/energy:   60 Gy delivered in a 3 field technique with daily CBCT for target localization  Narrative: The patient tolerated radiation treatment relatively well.   She maintained excellent energy and appetite. She had minimal dysphagia and some noderate dermatitis of her medial chest and back.   Plan: The patient has completed radiation treatment. The patient will return to radiation oncology clinic for routine followup in one month. I advised them to call or return sooner if they have any questions or concerns related to their recovery or treatment. She will have a CT scan prior to her return visit as well.   ------------------------------------------------  Thea Silversmith, MD

## 2014-06-15 ENCOUNTER — Telehealth: Payer: Self-pay | Admitting: Internal Medicine

## 2014-06-15 NOTE — Telephone Encounter (Signed)
lvm for pt regarding to Dec appts....mailed pt appt sched and letter

## 2014-07-12 ENCOUNTER — Other Ambulatory Visit (HOSPITAL_BASED_OUTPATIENT_CLINIC_OR_DEPARTMENT_OTHER): Payer: Medicare Other

## 2014-07-12 ENCOUNTER — Ambulatory Visit (HOSPITAL_COMMUNITY)
Admission: RE | Admit: 2014-07-12 | Discharge: 2014-07-12 | Disposition: A | Discharge status: Home / Self Care | Payer: Medicare Other | Source: Ambulatory Visit | Attending: Internal Medicine | Admitting: Internal Medicine

## 2014-07-12 ENCOUNTER — Encounter (HOSPITAL_COMMUNITY): Payer: Self-pay

## 2014-07-12 DIAGNOSIS — C3491 Malignant neoplasm of unspecified part of right bronchus or lung: Secondary | ICD-10-CM

## 2014-07-12 DIAGNOSIS — C801 Malignant (primary) neoplasm, unspecified: Secondary | ICD-10-CM | POA: Insufficient documentation

## 2014-07-12 DIAGNOSIS — C3431 Malignant neoplasm of lower lobe, right bronchus or lung: Secondary | ICD-10-CM

## 2014-07-12 LAB — COMPREHENSIVE METABOLIC PANEL (CC13)
ALT: 30 U/L (ref 0–55)
AST: 25 U/L (ref 5–34)
Albumin: 3.8 g/dL (ref 3.5–5.0)
Alkaline Phosphatase: 135 U/L (ref 40–150)
Anion Gap: 11 mEq/L (ref 3–11)
BUN: 8.8 mg/dL (ref 7.0–26.0)
CO2: 23 mEq/L (ref 22–29)
CREATININE: 0.7 mg/dL (ref 0.6–1.1)
Calcium: 9.6 mg/dL (ref 8.4–10.4)
Chloride: 106 mEq/L (ref 98–109)
EGFR: >90 mL/min/{1.73_m2} (ref >90)
Glucose: 96 mg/dl (ref 70–140)
Potassium: 4 mEq/L (ref 3.5–5.1)
Sodium: 140 mEq/L (ref 136–145)
Total Bilirubin: 0.37 mg/dL (ref 0.20–1.20)
Total Protein: 7.1 g/dL (ref 6.4–8.3)

## 2014-07-12 LAB — CBC WITH DIFFERENTIAL/PLATELET
BASO%: 0.7 % (ref 0.0–2.0)
BASOS ABS: 0 10*3/uL (ref 0.0–0.1)
EOS%: 2.3 % (ref 0.0–7.0)
Eosinophils Absolute: 0.1 10*3/uL (ref 0.0–0.5)
HCT: 36.3 % (ref 34.8–46.6)
HEMOGLOBIN: 11.9 g/dL (ref 11.6–15.9)
LYMPH%: 20.8 % (ref 14.0–49.7)
MCH: 30.5 pg (ref 25.1–34.0)
MCHC: 32.8 g/dL (ref 31.5–36.0)
MCV: 93.1 fL (ref 79.5–101.0)
MONO#: 0.7 10*3/uL (ref 0.1–0.9)
MONO%: 13.1 % (ref 0.0–14.0)
NEUT#: 3.6 10*3/uL (ref 1.5–6.5)
NEUT%: 63.1 % (ref 38.4–76.8)
Platelets: 253 10*3/uL (ref 145–400)
RBC: 3.9 10*6/uL (ref 3.70–5.45)
RDW: 17.2 % — AB (ref 11.2–14.5)
WBC: 5.6 10*3/uL (ref 3.9–10.3)
lymph#: 1.2 10*3/uL (ref 0.9–3.3)

## 2014-07-12 MED ORDER — IOHEXOL 300 MG/ML  SOLN
80.0000 mL | Freq: Once | INTRAMUSCULAR | Status: AC | PRN
  Administered 2014-07-12: 80 mL via INTRAVENOUS

## 2014-07-14 ENCOUNTER — Telehealth: Payer: Self-pay | Admitting: Internal Medicine

## 2014-07-14 ENCOUNTER — Ambulatory Visit
Admission: RE | Admit: 2014-07-14 | Discharge: 2014-07-14 | Disposition: A | Discharge status: Home / Self Care | Payer: Medicare Other | Source: Ambulatory Visit | Attending: Radiation Oncology | Admitting: Radiation Oncology

## 2014-07-14 VITALS — BP 146/98 | HR 124 | Temp 97.7°F | Wt 149.3 lb

## 2014-07-14 DIAGNOSIS — C3491 Malignant neoplasm of unspecified part of right bronchus or lung: Secondary | ICD-10-CM

## 2014-07-14 NOTE — Progress Notes (Signed)
   Department of Radiation Oncology  Phone:  [phone] Fax:        [phone]   Name: [name] MRN: [id number]  DOB: Nov 22, 1945  Date: 07/14/2014  Follow Up Visit Note  Diagnosis: Non-small cell cancer of right lung   Staging form: Lung, AJCC 7th Edition     Clinical: Stage IIIA (T3, N2, M0) - Signed by Thea Silversmith, MD on 04/20/2014  Interval History: [name] presents today for routine followup.  She has healed up well. Her cough is completely resolved. Her skin has healed up well. She continues to struggle with the pinched nerve in her neck. She is in good spirits. She has an appointment with Dr. Julien Nordmann next week. She had a CT of the chest with a great response to treatment. Her lymph nodes were smaller and the right lower lobe had re-expanded.   Physical Exam:  Filed Vitals:   07/14/14 1602  BP: 146/98  Pulse: 124  Temp: 97.7 F (36.5 C)  Weight: 149 lb 4.8 oz (67.722 kg)  SpO2: 99%   Pleasant, alert, awake  IMPRESSION: [name] is a 68 y.o. female s/p chemo radiation wth good response to treatment   PLAN:  We discussed her scan and I went through the images with her. We discussed that Dr. Julien Nordmann may recommend further chemotherapy. I have released her from follow up with me.  I will be happy to see her back on an as needed basis.     Thea Silversmith, MD

## 2014-07-14 NOTE — Telephone Encounter (Signed)
Pt came by and r/s appt from 07/19/14 to 08/01/14 due to being out of town. Pt recieved new cal.

## 2014-07-19 ENCOUNTER — Ambulatory Visit: Payer: Medicare Other | Admitting: Internal Medicine

## 2014-07-28 ENCOUNTER — Emergency Department (HOSPITAL_COMMUNITY)
Admission: EM | Admit: 2014-07-28 | Discharge: 2014-07-28 | Disposition: A | Discharge status: Home / Self Care | Payer: Medicare Other | Attending: Emergency Medicine | Admitting: Emergency Medicine

## 2014-07-28 ENCOUNTER — Emergency Department (HOSPITAL_COMMUNITY): Payer: Medicare Other

## 2014-07-28 ENCOUNTER — Encounter (HOSPITAL_COMMUNITY): Payer: Self-pay | Admitting: *Deleted

## 2014-07-28 DIAGNOSIS — J189 Pneumonia, unspecified organism: Secondary | ICD-10-CM

## 2014-07-28 DIAGNOSIS — R0602 Shortness of breath: Secondary | ICD-10-CM | POA: Diagnosis present

## 2014-07-28 DIAGNOSIS — R000 Tachycardia, unspecified: Secondary | ICD-10-CM | POA: Insufficient documentation

## 2014-07-28 DIAGNOSIS — Z7982 Long term (current) use of aspirin: Secondary | ICD-10-CM | POA: Insufficient documentation

## 2014-07-28 DIAGNOSIS — M1990 Unspecified osteoarthritis, unspecified site: Secondary | ICD-10-CM | POA: Insufficient documentation

## 2014-07-28 DIAGNOSIS — I10 Essential (primary) hypertension: Secondary | ICD-10-CM | POA: Diagnosis not present

## 2014-07-28 DIAGNOSIS — Z792 Long term (current) use of antibiotics: Secondary | ICD-10-CM | POA: Diagnosis not present

## 2014-07-28 DIAGNOSIS — Z79899 Other long term (current) drug therapy: Secondary | ICD-10-CM | POA: Diagnosis not present

## 2014-07-28 DIAGNOSIS — Z85118 Personal history of other malignant neoplasm of bronchus and lung: Secondary | ICD-10-CM | POA: Insufficient documentation

## 2014-07-28 DIAGNOSIS — F419 Anxiety disorder, unspecified: Secondary | ICD-10-CM | POA: Insufficient documentation

## 2014-07-28 DIAGNOSIS — J159 Unspecified bacterial pneumonia: Secondary | ICD-10-CM | POA: Insufficient documentation

## 2014-07-28 DIAGNOSIS — E079 Disorder of thyroid, unspecified: Secondary | ICD-10-CM | POA: Diagnosis not present

## 2014-07-28 DIAGNOSIS — Z8709 Personal history of other diseases of the respiratory system: Secondary | ICD-10-CM | POA: Diagnosis not present

## 2014-07-28 DIAGNOSIS — R0600 Dyspnea, unspecified: Secondary | ICD-10-CM

## 2014-07-28 HISTORY — DX: Malignant neoplasm of unspecified part of unspecified bronchus or lung: C34.90

## 2014-07-28 LAB — CBC WITH DIFFERENTIAL/PLATELET
BASOS PCT: 1 % (ref 0–1)
Basophils Absolute: 0 10*3/uL (ref 0.0–0.1)
EOS PCT: 2 % (ref 0–5)
Eosinophils Absolute: 0.1 10*3/uL (ref 0.0–0.7)
HCT: 34.6 % — ABNORMAL LOW (ref 36.0–46.0)
HEMOGLOBIN: 11.6 g/dL — AB (ref 12.0–15.0)
LYMPHS PCT: 14 % (ref 12–46)
Lymphs Abs: 1.1 10*3/uL (ref 0.7–4.0)
MCH: 31.8 pg (ref 26.0–34.0)
MCHC: 33.5 g/dL (ref 30.0–36.0)
MCV: 94.8 fL (ref 78.0–100.0)
MONO ABS: 1 10*3/uL (ref 0.1–1.0)
Monocytes Relative: 13 % — ABNORMAL HIGH (ref 3–12)
NEUTROS ABS: 5.3 10*3/uL (ref 1.7–7.7)
Neutrophils Relative %: 70 % (ref 43–77)
PLATELETS: 283 10*3/uL (ref 150–400)
RBC: 3.65 MIL/uL — AB (ref 3.87–5.11)
RDW: 16.3 % — AB (ref 11.5–15.5)
WBC: 7.5 10*3/uL (ref 4.0–10.5)

## 2014-07-28 LAB — BASIC METABOLIC PANEL
Anion gap: 7 (ref 5–15)
BUN: 12 mg/dL (ref 6–23)
CO2: 26 mmol/L (ref 19–32)
Calcium: 9.3 mg/dL (ref 8.4–10.5)
Chloride: 105 mEq/L (ref 96–112)
Creatinine, Ser: 0.53 mg/dL (ref 0.50–1.10)
Glucose, Bld: 120 mg/dL — ABNORMAL HIGH (ref 70–99)
Potassium: 3.9 mmol/L (ref 3.5–5.1)
Sodium: 138 mmol/L (ref 135–145)

## 2014-07-28 LAB — TROPONIN I

## 2014-07-28 MED ORDER — LEVOFLOXACIN 750 MG PO TABS: 750.0000 mg | ORAL_TABLET | Freq: Every day | ORAL | Status: DC

## 2014-07-28 MED ORDER — MORPHINE SULFATE 4 MG/ML IJ SOLN
4.0000 mg | Freq: Once | INTRAMUSCULAR | Status: AC
  Administered 2014-07-28: 4 mg via INTRAVENOUS
  Filled 2014-07-28: qty 1

## 2014-07-28 MED ORDER — HYDROCODONE-ACETAMINOPHEN 5-325 MG PO TABS: 1.0000 | ORAL_TABLET | ORAL | Status: DC | PRN

## 2014-07-28 MED ORDER — SODIUM CHLORIDE 0.9 % IV BOLUS (SEPSIS)
500.0000 mL | Freq: Once | INTRAVENOUS | Status: AC
  Administered 2014-07-28: 500 mL via INTRAVENOUS

## 2014-07-28 MED ORDER — IOHEXOL 350 MG/ML SOLN
100.0000 mL | Freq: Once | INTRAVENOUS | Status: AC | PRN
  Administered 2014-07-28: 100 mL via INTRAVENOUS

## 2014-07-28 MED ORDER — LEVOFLOXACIN 750 MG PO TABS
750.0000 mg | ORAL_TABLET | Freq: Once | ORAL | Status: AC
  Administered 2014-07-28: 750 mg via ORAL
  Filled 2014-07-28: qty 1

## 2014-07-28 NOTE — ED Notes (Signed)
Pt verbalized understanding of no driving and to use caution within 4 hours of taking pain meds due to meds cause drowsiness 

## 2014-07-28 NOTE — ED Notes (Signed)
Pt ambulated to BR without difficultly

## 2014-07-28 NOTE — ED Notes (Signed)
Being tx for lung cancer at [facility],  Has had increasing sob since 12/24, with pain rt post chest   Has had low grade fever,  Cough, sputum with yellow streaks at times    Post tussive vomiting

## 2014-07-28 NOTE — Discharge Instructions (Signed)
If you were given medicines take as directed.  If you are on coumadin or contraceptives realize their levels and effectiveness is altered by many different medicines.  If you have any reaction (rash, tongues swelling, other) to the medicines stop taking and see a physician.   Please follow up as directed and return to the ER or see a physician for new or worsening symptoms.  Thank you. Filed Vitals:   07/28/14 1544 07/28/14 1759  BP: 155/93 129/85  Pulse: 124 107  Temp: 99.4 F (37.4 C)   TempSrc: Oral   Resp: 22 18  Height: 5\' 8"  (1.727 m)   Weight: 142 lb (64.411 kg)   SpO2: 98% 98%   For severe pain take norco or vicodin however realize they have the potential for addiction and it can make you sleepy and has tylenol in it.  No operating machinery while taking.

## 2014-07-28 NOTE — ED Provider Notes (Signed)
CSN: [id number]     Arrival date & time 07/28/14  1537 History   First MD Initiated Contact with Patient 07/28/14 1551     Chief Complaint  Patient presents with  . Shortness of Breath     (Consider location/radiation/quality/duration/timing/severity/associated sxs/prior Treatment) HPI Comments: 69 year old female with high blood pressure, lipids, thyroid cancer, recent diagnosis of non-small cell lung cancer right lung, last chemotherapy November 16, last radiation November 17 presents with pleuritic right lower chest wall pain, cough and shortness of breath. Patient has had worsening symptoms of shortness of breath since Christmas Eve right sided. Patient also has had productive cough. Mild vomiting with persistent coughing. No history of blood clots, no unilateral leg symptoms, no hemoptysis.  Patient is a 69 y.o. female presenting with shortness of breath. The history is provided by the patient.  Shortness of Breath Associated symptoms: chest pain, cough and fever   Associated symptoms: no abdominal pain, no headaches, no neck pain, no rash and no vomiting     Past Medical History  Diagnosis Date  . Thyroid disease     hypothyroidism  . Hx of thyroid cancer   . Status post bilateral breast implants   . Consolidation lung 03/21/14    RLL PER CXR/CT  . Hypertension     just started,not on medication  . Shortness of breath     exertion  . Hypothyroidism   . Mitral valve prolapse   . Anxiety   . Arthritis   . Fibromyalgia   . Complication of anesthesia     headaches,very agitated while waking  up  . Cancer     thyroid  . Lung cancer    Past Surgical History  Procedure Laterality Date  . Thyroidectomy    . Cataract extraction Right   . Breast enhancement surgery    . Retinal detachment surgery    . Eye surgery    . Pars plana vitrectomy w/ repair of macular hole    . Back surgery      times 2  . Tonsillectomy    . Cervical cone biopsy    . Video bronchoscopy with  endobronchial ultrasound N/A 04/07/2014    Procedure: VIDEO BRONCHOSCOPY WITH ENDOBRONCHIAL ULTRASOUND;  Surgeon: Melrose Nakayama, MD;  Location: [facility] OR;  Service: Thoracic;  Laterality: N/A;   Family History  Problem Relation Age of Onset  . Early death Mother   . Heart disease Mother   . Early death Father   . Heart disease Father     MI  . Cancer Sister     BREAST  . Heart disease Sister     MI 2013   History  Substance Use Topics  . Smoking status: Former Smoker -- 0.25 packs/day for 49 years    Types: Cigarettes    Quit date: 02/25/2014  . Smokeless tobacco: Never Used  . Alcohol Use: Yes     Comment: SOCIAL   OB History    No data available     Review of Systems  Constitutional: Positive for fever. Negative for chills.  HENT: Negative for congestion.   Eyes: Negative for visual disturbance.  Respiratory: Positive for cough and shortness of breath.   Cardiovascular: Positive for chest pain. Negative for leg swelling.  Gastrointestinal: Negative for vomiting and abdominal pain.  Genitourinary: Negative for dysuria and flank pain.  Musculoskeletal: Negative for back pain, neck pain and neck stiffness.  Skin: Negative for rash.  Neurological: Negative for light-headedness and headaches.  Allergies  Niacin and related  Home Medications   Prior to Admission medications   Medication Sig Start Date End Date Taking? Authorizing Provider  Alum & Mag Hydroxide-Simeth (MAGIC MOUTHWASH W/LIDOCAINE) SOLN Take 5 mLs by mouth 3 (three) times daily as needed for mouth pain. 05/09/14  Yes Thea Silversmith, MD  amLODipine (NORVASC) 5 MG tablet Take 5 mg by mouth daily.  04/21/14  Yes Historical Provider, MD  aspirin EC 81 MG tablet Take 162 mg by mouth at bedtime.    Yes Historical Provider, MD  clonazePAM (KLONOPIN) 0.5 MG tablet Take 0.5 mg by mouth at bedtime as needed for anxiety.   Yes Historical Provider, MD  estradiol (ESTRACE) 0.5 MG tablet Take 0.5 mg by mouth  daily.   Yes Historical Provider, MD  ibuprofen (ADVIL,MOTRIN) 100 MG tablet Take 200 mg by mouth every 6 (six) hours as needed for fever.   Yes Historical Provider, MD  levothyroxine (SYNTHROID, LEVOTHROID) 125 MCG tablet Take 250 mcg by mouth daily before breakfast.    Yes Historical Provider, MD  medroxyPROGESTERone (PROVERA) 2.5 MG tablet Take 2.5 mg by mouth daily.   Yes Historical Provider, MD  morphine (MSIR) 15 MG tablet Take 1/2 tablet to 1 tablet po q 6 hours prn pain Patient taking differently: Take 7.5-15 mg by mouth every 6 (six) hours as needed for moderate pain or severe pain. Take 1/2 tablet to 1 tablet po q 6 hours prn pain 06/13/14  Yes Thea Silversmith, MD  nicotine (NICODERM CQ - DOSED IN MG/24 HOURS) 14 mg/24hr patch Place 14 mg onto the skin daily.   Yes Historical Provider, MD  cyclobenzaprine (FLEXERIL) 5 MG tablet Take 1 tablet (5 mg total) by mouth 3 (three) times daily as needed for muscle spasms. Patient not taking: Reported on 07/28/2014 06/13/14   Thea Silversmith, MD  HYDROcodone-acetaminophen [facility]) 5-325 MG per tablet Take 1-2 tablets by mouth every 4 (four) hours as needed. 07/28/14   Mariea Clonts, MD  HYDROcodone-homatropine [employer]) 5-1.5 MG/5ML syrup Take 5 mLs by mouth every 6 (six) hours as needed for cough. Patient not taking: Reported on 07/14/2014 05/31/14   Thea Silversmith, MD  levofloxacin (LEVAQUIN) 750 MG tablet Take 1 tablet (750 mg total) by mouth daily. 07/29/14   Mariea Clonts, MD  prochlorperazine (COMPAZINE) 10 MG tablet Take 1 tablet (10 mg total) by mouth every 6 (six) hours as needed for nausea or vomiting. Patient not taking: Reported on 07/14/2014 05/08/14   Burnetta Sabin E Johnson, PA-C   BP 129/85 mmHg  Pulse 107  Temp(Src) 99.4 F (37.4 C) (Oral)  Resp 18  Ht 5\' 8"  (1.727 m)  Wt 142 lb (64.411 kg)  BMI 21.60 kg/m2  SpO2 98% Physical Exam  Constitutional: She is oriented to person, place, and time. She appears well-developed and  well-nourished.  HENT:  Head: Normocephalic and atraumatic.  Eyes: Conjunctivae are normal. Right eye exhibits no discharge. Left eye exhibits no discharge.  Neck: Normal range of motion. Neck supple. No tracheal deviation present.  Cardiovascular: Regular rhythm.  Tachycardia present.   Pulmonary/Chest: Effort normal. She has rales (few sparse bilateral).  Abdominal: Soft. She exhibits no distension. There is no tenderness. There is no guarding.  Musculoskeletal: She exhibits no edema.  Neurological: She is alert and oriented to person, place, and time.  Skin: Skin is warm. No rash noted.  Psychiatric: She has a normal mood and affect.  Nursing note and vitals reviewed.   ED Course  Procedures (  including critical care time) Labs Review Labs Reviewed  BASIC METABOLIC PANEL - Abnormal; Notable for the following:    Glucose, Bld 120 (*)    All other components within normal limits  CBC WITH DIFFERENTIAL - Abnormal; Notable for the following:    RBC 3.65 (*)    Hemoglobin 11.6 (*)    HCT 34.6 (*)    RDW 16.3 (*)    Monocytes Relative 13 (*)    All other components within normal limits  TROPONIN I    Imaging Review Ct Angio Chest Pe W/cm &/or Wo Cm  07/28/2014   CLINICAL DATA:  Initial encounter for several day history of chest pain and shortness of breath. Personal history on an thyroid cancer apparent  EXAM: CT ANGIOGRAPHY CHEST WITH CONTRAST  TECHNIQUE: Multidetector CT imaging of the chest was performed using the standard protocol during bolus administration of intravenous contrast. Multiplanar CT image reconstructions and MIPs were obtained to evaluate the vascular anatomy.  CONTRAST:  174mL OMNIPAQUE IOHEXOL 350 MG/ML SOLN  COMPARISON:  07/12/2014  FINDINGS: Soft tissue / Mediastinum: There is no axillary lymphadenopathy. No mediastinal or hilar lymphadenopathy. Heart size is normal. No pericardial effusion.  Lungs / Pleura: Lung windows show interlobular thickening with areas of  patchy ground-glass attenuation. This is new in the interval and has a central right upper lobe predominance with right middle lobe involvement. There is persistent right lower lobe volume loss with confluent airspace consolidation in the right lower lobe, progressed in the interval.  Biapical pleural parenchymal scarring is stable. 7 mm left upper lobe nodule seen on image 14 is stable in the interval. No substantial pleural effusion. No pulmonary edema.  Bones: Bone windows reveal no worrisome lytic or sclerotic osseous lesions.  Upper Abdomen: 11 mm hypervascular lesion in the right liver is again noted, unchanged.  Review of the MIP images confirms the above findings.  IMPRESSION: Interval development of patchy central ground-glass attenuation in the right upper and middle lobes. Given the unilateral involvement, imaging features may be related to multi focal pneumonia.  The right lower lobe collapse/consolidation has progressed in the interval (compare image 59 series 5 today thick image 41 series 5 previously). This may represent evolving radiation change or disease progression and continued close attention is recommended.  11 mm hypervascular liver lesion is unchanged.   Electronically Signed   By: Misty Stanley M.D.   On: 07/28/2014 17:11     EKG Interpretation None     EKG reviewed heart rate 117, no acute ST elevation, sinus tach regular, normal QT MDM   Final diagnoses:  Dyspnea  CAP (community acquired pneumonia)   Patient actively being treated for cancer presents with right-sided pleuritic chest pain and cough. Discussed differential including pneumonia, blood clot, pleuritis from cancer/infection, other. Pain medicines, IV fluids, CT anterior chest ordered.  CT results reviewed no acute pulmonary embolism, multifocal pneumonia. Patient improved on recheck not requiring oxygen mild tachycardia which improved significantly with fluids and pain meds. Patient comfortable outpatient  follow-up and strict reasons to return. Patient has appointment early this week with oncology. Oral antibiotics in the ER and oral antibiotics for home.  Results and differential diagnosis were discussed with the patient/parent/guardian. Close follow up outpatient was discussed, comfortable with the plan.   Medications  sodium chloride 0.9 % bolus 500 mL (0 mLs Intravenous Stopped 07/28/14 1817)  morphine 4 MG/ML injection 4 mg (4 mg Intravenous Given 07/28/14 1710)  iohexol (OMNIPAQUE) 350 MG/ML injection 100 mL (  100 mLs Intravenous Contrast Given 07/28/14 1658)  levofloxacin (LEVAQUIN) tablet 750 mg (750 mg Oral Given 07/28/14 1817)    Filed Vitals:   07/28/14 1544 07/28/14 1759  BP: 155/93 129/85  Pulse: 124 107  Temp: 99.4 F (37.4 C)   TempSrc: Oral   Resp: 22 18  Height: 5\' 8"  (1.727 m)   Weight: 142 lb (64.411 kg)   SpO2: 98% 98%    Final diagnoses:  Dyspnea  CAP (community acquired pneumonia)      Mariea Clonts, MD 07/28/14 [phone]

## 2014-08-01 ENCOUNTER — Ambulatory Visit (HOSPITAL_BASED_OUTPATIENT_CLINIC_OR_DEPARTMENT_OTHER): Payer: BLUE CROSS/BLUE SHIELD | Admitting: Internal Medicine

## 2014-08-01 ENCOUNTER — Encounter: Payer: Self-pay | Admitting: Internal Medicine

## 2014-08-01 VITALS — BP 156/98 | HR 114 | Temp 98.0°F | Resp 21 | Ht 68.0 in | Wt 148.2 lb

## 2014-08-01 DIAGNOSIS — J181 Lobar pneumonia, unspecified organism: Secondary | ICD-10-CM

## 2014-08-01 DIAGNOSIS — C3431 Malignant neoplasm of lower lobe, right bronchus or lung: Secondary | ICD-10-CM

## 2014-08-01 DIAGNOSIS — C3411 Malignant neoplasm of upper lobe, right bronchus or lung: Secondary | ICD-10-CM

## 2014-08-01 DIAGNOSIS — R0602 Shortness of breath: Secondary | ICD-10-CM

## 2014-08-01 DIAGNOSIS — J189 Pneumonia, unspecified organism: Secondary | ICD-10-CM | POA: Insufficient documentation

## 2014-08-01 DIAGNOSIS — C3491 Malignant neoplasm of unspecified part of right bronchus or lung: Secondary | ICD-10-CM

## 2014-08-01 DIAGNOSIS — R062 Wheezing: Secondary | ICD-10-CM

## 2014-08-01 MED ORDER — METHYLPREDNISOLONE (PAK) 4 MG PO TABS: ORAL_TABLET | ORAL | Status: DC

## 2014-08-01 MED ORDER — LEVOFLOXACIN 750 MG PO TABS: 750.0000 mg | ORAL_TABLET | Freq: Every day | ORAL | Status: DC

## 2014-08-01 NOTE — Progress Notes (Signed)
[facility] [phone]   [phone]) [phone]  OFFICE PROGRESS NOTE  Asencion Noble, MD [address] [place] [address]  DIAGNOSIS: Stage IIIA (T3, N2, M0) non-small cell lung cancer, squamous cell carcinoma diagnosed in September of 2015 presenting with central right lower lobe lung mass with postobstructive pneumonitis and mediastinal lymph nodes, was also a suspicious small nodule in the right upper lobe.  PRIOR THERAPY: Status post a course of concurrent chemoradiation with weekly carboplatin for AUC of 2 and paclitaxel 45 MG/M2, last dose was given 06/12/2014 with partial response.  CURRENT THERAPY: None  INTERVAL HISTORY: [name] 69 y.o. female returns to the clinic today for follow-up visit accompanied by her husband. The patient related the previous course of concurrent chemoradiation fairly well with no significant adverse effects except for fatigue. She denied having any significant nausea or vomiting. She has no significant weight loss or night sweats during her treatment. Recently she had fever with right sided chest pain and shortness of breath. The patient was seen at the emergency Department at [facility] on 07/28/2014 and CT angiogram of the chest showed interval development of patchy central groundglass attenuation in the right upper and middle lobes with feature concerning for multifocal pneumonia. She was started on treatment with Levaquin for 6 days. She has not noticed significant improvement in her condition until now. The patient is here today for evaluation and discussion of her treatment options. She has no fever today and her oxygen saturation is 96% on room air.   MEDICAL HISTORY: Past Medical History  Diagnosis Date  . Thyroid disease     hypothyroidism  . Hx of thyroid cancer   . Status post bilateral breast implants   . Consolidation lung 03/21/14    RLL PER CXR/CT  . Hypertension     just  started,not on medication  . Shortness of breath     exertion  . Hypothyroidism   . Mitral valve prolapse   . Anxiety   . Arthritis   . Fibromyalgia   . Complication of anesthesia     headaches,very agitated while waking  up  . Cancer     thyroid  . Lung cancer     ALLERGIES:  is allergic to niacin and related.  MEDICATIONS:  Current Outpatient Prescriptions  Medication Sig Dispense Refill  . Alum & Mag Hydroxide-Simeth (MAGIC MOUTHWASH W/LIDOCAINE) SOLN Take 5 mLs by mouth 3 (three) times daily as needed for mouth pain. 480 mL 1  . amLODipine (NORVASC) 5 MG tablet Take 5 mg by mouth daily.   4  . aspirin EC 81 MG tablet Take 162 mg by mouth at bedtime.     . clonazePAM (KLONOPIN) 0.5 MG tablet Take 0.5 mg by mouth at bedtime as needed for anxiety.    . cyclobenzaprine (FLEXERIL) 5 MG tablet Take 1 tablet (5 mg total) by mouth 3 (three) times daily as needed for muscle spasms. 45 tablet 0  . estradiol (ESTRACE) 0.5 MG tablet Take 0.5 mg by mouth daily.    Marland Kitchen HYDROcodone-acetaminophen (NORCO) 5-325 MG per tablet Take 1-2 tablets by mouth every 4 (four) hours as needed. 6 tablet 0  . HYDROcodone-homatropine (HYCODAN) 5-1.5 MG/5ML syrup Take 5 mLs by mouth every 6 (six) hours as needed for cough. 240 mL 0  . ibuprofen (ADVIL,MOTRIN) 100 MG tablet Take 200 mg by mouth every 6 (six) hours as needed for fever.    Marland Kitchen  levofloxacin (LEVAQUIN) 750 MG tablet Take 1 tablet (750 mg total) by mouth daily. 4 tablet 0  . levothyroxine (SYNTHROID, LEVOTHROID) 125 MCG tablet Take 250 mcg by mouth daily before breakfast.     . medroxyPROGESTERone (PROVERA) 2.5 MG tablet Take 2.5 mg by mouth daily.    Marland Kitchen morphine (MSIR) 15 MG tablet Take 1/2 tablet to 1 tablet po q 6 hours prn pain (Patient taking differently: Take 7.5-15 mg by mouth every 6 (six) hours as needed for moderate pain or severe pain. Take 1/2 tablet to 1 tablet po q 6 hours prn pain) 75 tablet 0  . nicotine (NICODERM CQ - DOSED IN MG/24 HOURS)  14 mg/24hr patch Place 14 mg onto the skin daily.    . methylPREDNIsolone (MEDROL DOSPACK) 4 MG tablet follow package directions 21 tablet 0  . prochlorperazine (COMPAZINE) 10 MG tablet Take 1 tablet (10 mg total) by mouth every 6 (six) hours as needed for nausea or vomiting. (Patient not taking: Reported on 08/01/2014) 30 tablet 2   No current facility-administered medications for this visit.    SURGICAL HISTORY:  Past Surgical History  Procedure Laterality Date  . Thyroidectomy    . Cataract extraction Right   . Breast enhancement surgery    . Retinal detachment surgery    . Eye surgery    . Pars plana vitrectomy w/ repair of macular hole    . Back surgery      times 2  . Tonsillectomy    . Cervical cone biopsy    . Video bronchoscopy with endobronchial ultrasound N/A 04/07/2014    Procedure: VIDEO BRONCHOSCOPY WITH ENDOBRONCHIAL ULTRASOUND;  Surgeon: Melrose Nakayama, MD;  Location: [facility];  Service: Thoracic;  Laterality: N/A;    REVIEW OF SYSTEMS:  Constitutional: positive for fatigue Eyes: negative Ears, nose, mouth, throat, and face: negative Respiratory: positive for cough, dyspnea on exertion, sputum and wheezing Cardiovascular: negative Gastrointestinal: negative Genitourinary:negative Integument/breast: negative Hematologic/lymphatic: negative Musculoskeletal:negative Neurological: negative Behavioral/Psych: negative Endocrine: negative Allergic/Immunologic: negative   PHYSICAL EXAMINATION: General appearance: alert, cooperative, fatigued and no distress Head: Normocephalic, without obvious abnormality, atraumatic Neck: no adenopathy, no JVD, supple, symmetrical, trachea midline and thyroid not enlarged, symmetric, no tenderness/mass/nodules Lymph nodes: Cervical, supraclavicular, and axillary nodes normal. Resp: wheezes bilaterally Back: symmetric, no curvature. ROM normal. No CVA tenderness. Cardio: regular rate and rhythm, S1, S2 normal, no murmur, click,  rub or gallop GI: soft, non-tender; bowel sounds normal; no masses,  no organomegaly Extremities: extremities normal, atraumatic, no cyanosis or edema Neurologic: Alert and oriented X 3, normal strength and tone. Normal symmetric reflexes. Normal coordination and gait  ECOG PERFORMANCE STATUS: 1 - Symptomatic but completely ambulatory  Blood pressure 156/98, pulse 114, temperature 98 F (36.7 C), temperature source Oral, resp. rate 21, height 5\' 8"  (1.727 m), weight 148 lb 3.2 oz (67.223 kg), SpO2 96 %.  LABORATORY DATA: Lab Results  Component Value Date   WBC 7.5 07/28/2014   HGB 11.6* 07/28/2014   HCT 34.6* 07/28/2014   MCV 94.8 07/28/2014   PLT 283 07/28/2014      Chemistry      Component Value Date/Time   NA 138 07/28/2014 1611   NA 140 07/12/2014 1009   K 3.9 07/28/2014 1611   K 4.0 07/12/2014 1009   CL 105 07/28/2014 1611   CO2 26 07/28/2014 1611   CO2 23 07/12/2014 1009   BUN 12 07/28/2014 1611   BUN 8.8 07/12/2014 1009   CREATININE 0.53 07/28/2014  1611   CREATININE 0.7 07/12/2014 1009      Component Value Date/Time   CALCIUM 9.3 07/28/2014 1611   CALCIUM 9.6 07/12/2014 1009   ALKPHOS 135 07/12/2014 1009   ALKPHOS 197* 04/05/2014 1336   AST 25 07/12/2014 1009   AST 18 04/05/2014 1336   ALT 30 07/12/2014 1009   ALT 33 04/05/2014 1336   BILITOT 0.37 07/12/2014 1009   BILITOT 0.3 04/05/2014 1336       RADIOGRAPHIC STUDIES: Ct Chest W Contrast  07/12/2014   CLINICAL DATA:  Followup lung cancer.  EXAM: CT CHEST WITH CONTRAST  TECHNIQUE: Multidetector CT imaging of the chest was performed during intravenous contrast administration.  CONTRAST:  70mL OMNIPAQUE IOHEXOL 300 MG/ML  SOLN  COMPARISON:  03/23/2014  FINDINGS: Mediastinum: Right paratracheal lymph node measures 4 mm, image 19/series 2. Previously 6 mm. Low right paratracheal lymph node measures 6 mm, image 25/series 2. Previously 8 mm. Sub- carinal lymph node measures 6 mm, image 33/ series 2. Previously  13 mm. Resolution of previous right hilar adenopathy.  Lungs/Pleura: Significant interval improvement in aeration to the right lower lobe with markedly diminished tumor volume. The only measurable area measures 2.3 x 1.2 cm, image 36/series 5. There are a few ground-glass attenuating nodules within the right upper lobe, similar to previous exam. No new or enlarging pulmonary nodules identified.  Upper Abdomen: Hypervascular liver lesion within the right hepatic lobe measures 1.1 cm. This is unchanged from the previous exam. Surrounding increased attenuation and retraction of the overlying liver capsule is noted.  Musculoskeletal: Review of the visualized osseous structures shows no aggressive lytic or sclerotic bone lesions.  IMPRESSION: 1. Overall interval response to therapy. There has been significant decrease in tumor volume of within the right lower lobe with associated improved aeration to the right lung. 2. Decrease in size of previous enlarged mediastinal and right hilar lymph nodes. 3. Indeterminate lesion is identified within the right hepatic lobe. On today's study this appears hypervascular. This is not changed from the PET-CT dated 04/19/2014 with there was no significant FDG uptake associated with this structure. This may be better assessed with contrast enhanced MRI of the liver.   Electronically Signed   By: Kerby Moors M.D.   On: 07/12/2014 12:33   Ct Angio Chest Pe W/cm &/or Wo Cm  07/28/2014   CLINICAL DATA:  Initial encounter for several day history of chest pain and shortness of breath. Personal history on an thyroid cancer apparent  EXAM: CT ANGIOGRAPHY CHEST WITH CONTRAST  TECHNIQUE: Multidetector CT imaging of the chest was performed using the standard protocol during bolus administration of intravenous contrast. Multiplanar CT image reconstructions and MIPs were obtained to evaluate the vascular anatomy.  CONTRAST:  161mL OMNIPAQUE IOHEXOL 350 MG/ML SOLN  COMPARISON:  07/12/2014   FINDINGS: Soft tissue / Mediastinum: There is no axillary lymphadenopathy. No mediastinal or hilar lymphadenopathy. Heart size is normal. No pericardial effusion.  Lungs / Pleura: Lung windows show interlobular thickening with areas of patchy ground-glass attenuation. This is new in the interval and has a central right upper lobe predominance with right middle lobe involvement. There is persistent right lower lobe volume loss with confluent airspace consolidation in the right lower lobe, progressed in the interval.  Biapical pleural parenchymal scarring is stable. 7 mm left upper lobe nodule seen on image 14 is stable in the interval. No substantial pleural effusion. No pulmonary edema.  Bones: Bone windows reveal no worrisome lytic or sclerotic osseous lesions.  Upper Abdomen: 11 mm hypervascular lesion in the right liver is again noted, unchanged.  Review of the MIP images confirms the above findings.  IMPRESSION: Interval development of patchy central ground-glass attenuation in the right upper and middle lobes. Given the unilateral involvement, imaging features may be related to multi focal pneumonia.  The right lower lobe collapse/consolidation has progressed in the interval (compare image 59 series 5 today thick image 41 series 5 previously). This may represent evolving radiation change or disease progression and continued close attention is recommended.  11 mm hypervascular liver lesion is unchanged.   Electronically Signed   By: Misty Stanley M.D.   On: 07/28/2014 17:11    ASSESSMENT AND PLAN: This is a very pleasant 69 years old white female with history of stage IIIA non-small cell lung cancer status post a course of concurrent chemoradiation with weekly carboplatin and paclitaxel with significant improvement in her disease based on the CT scan result of 07/12/2014. I discussed with the patient her treatment options including continuous observation and close monitoring with repeat his scan versus  consideration of 3 cycles of consolidation chemotherapy with either carboplatin and paclitaxel or carboplatin and gemcitabine to decrease the risk of alopecia. The patient is interested in the consolidation chemotherapy and this will be discussed in more details in 2 weeks after improvement in her current condition. For the questionable multifocal pneumonia, I will extend her antibiotic coverage for total of 10 days. The patient also has significant wheezes and shortness of breath that could be related to radiation induced pneumonitis. I will start her on Medrol Dosepak in 2 days after she has enough coverage of antibiotics to avoid any flare of her pneumonia. She will come back for follow-up visit in one week for reevaluation. She was also advised to go immediately to the emergency department if she has worsening of her condition including worsening dyspnea, chest pain or fever. She was advised to call immediately if she has any concerning symptoms in the interval. The patient voices understanding of current disease status and treatment options and is in agreement with the current care plan.  All questions were answered. The patient knows to call the clinic with any problems, questions or concerns. We can certainly see the patient much sooner if necessary.  I spent 20 minutes counseling the patient face to face. The total time spent in the appointment was 30 minutes.  Disclaimer: This note was dictated with voice recognition software. Similar sounding words can inadvertently be transcribed and may not be corrected upon review.

## 2014-08-07 ENCOUNTER — Ambulatory Visit (HOSPITAL_COMMUNITY)
Admission: RE | Admit: 2014-08-07 | Discharge: 2014-08-07 | Disposition: A | Discharge status: Home / Self Care | Payer: Medicare Other | Source: Ambulatory Visit | Attending: Oncology | Admitting: Oncology

## 2014-08-07 ENCOUNTER — Telehealth: Payer: Self-pay | Admitting: Oncology

## 2014-08-07 ENCOUNTER — Ambulatory Visit (HOSPITAL_BASED_OUTPATIENT_CLINIC_OR_DEPARTMENT_OTHER): Payer: BLUE CROSS/BLUE SHIELD | Admitting: Oncology

## 2014-08-07 ENCOUNTER — Encounter: Payer: Self-pay | Admitting: Oncology

## 2014-08-07 ENCOUNTER — Other Ambulatory Visit (HOSPITAL_BASED_OUTPATIENT_CLINIC_OR_DEPARTMENT_OTHER): Payer: BLUE CROSS/BLUE SHIELD

## 2014-08-07 VITALS — BP 149/97 | HR 113 | Temp 98.7°F | Resp 18 | Ht 68.0 in | Wt 146.7 lb

## 2014-08-07 DIAGNOSIS — R062 Wheezing: Secondary | ICD-10-CM | POA: Diagnosis present

## 2014-08-07 DIAGNOSIS — R069 Unspecified abnormalities of breathing: Secondary | ICD-10-CM | POA: Insufficient documentation

## 2014-08-07 DIAGNOSIS — R509 Fever, unspecified: Secondary | ICD-10-CM | POA: Diagnosis present

## 2014-08-07 DIAGNOSIS — C3491 Malignant neoplasm of unspecified part of right bronchus or lung: Secondary | ICD-10-CM | POA: Diagnosis not present

## 2014-08-07 DIAGNOSIS — R05 Cough: Secondary | ICD-10-CM | POA: Insufficient documentation

## 2014-08-07 DIAGNOSIS — C3431 Malignant neoplasm of lower lobe, right bronchus or lung: Secondary | ICD-10-CM

## 2014-08-07 DIAGNOSIS — R918 Other nonspecific abnormal finding of lung field: Secondary | ICD-10-CM | POA: Diagnosis not present

## 2014-08-07 DIAGNOSIS — Z923 Personal history of irradiation: Secondary | ICD-10-CM | POA: Diagnosis not present

## 2014-08-07 DIAGNOSIS — C3411 Malignant neoplasm of upper lobe, right bronchus or lung: Secondary | ICD-10-CM

## 2014-08-07 DIAGNOSIS — R0602 Shortness of breath: Secondary | ICD-10-CM

## 2014-08-07 LAB — CBC WITH DIFFERENTIAL/PLATELET
BASO%: 0.7 % (ref 0.0–2.0)
BASOS ABS: 0.1 10*3/uL (ref 0.0–0.1)
EOS%: 0.9 % (ref 0.0–7.0)
Eosinophils Absolute: 0.1 10*3/uL (ref 0.0–0.5)
HCT: 39.1 % (ref 34.8–46.6)
HEMOGLOBIN: 12.7 g/dL (ref 11.6–15.9)
LYMPH#: 1.1 10*3/uL (ref 0.9–3.3)
LYMPH%: 7.4 % — AB (ref 14.0–49.7)
MCH: 30.7 pg (ref 25.1–34.0)
MCHC: 32.6 g/dL (ref 31.5–36.0)
MCV: 94 fL (ref 79.5–101.0)
MONO#: 1.1 10*3/uL — ABNORMAL HIGH (ref 0.1–0.9)
MONO%: 7.4 % (ref 0.0–14.0)
NEUT#: 12.5 10*3/uL — ABNORMAL HIGH (ref 1.5–6.5)
NEUT%: 83.6 % — ABNORMAL HIGH (ref 38.4–76.8)
Platelets: 487 10*3/uL — ABNORMAL HIGH (ref 145–400)
RBC: 4.16 10*6/uL (ref 3.70–5.45)
RDW: 16.6 % — ABNORMAL HIGH (ref 11.2–14.5)
WBC: 14.9 10*3/uL — ABNORMAL HIGH (ref 3.9–10.3)

## 2014-08-07 LAB — COMPREHENSIVE METABOLIC PANEL (CC13)
ALBUMIN: 3.7 g/dL (ref 3.5–5.0)
ALT: 37 U/L (ref 0–55)
AST: 26 U/L (ref 5–34)
Alkaline Phosphatase: 138 U/L (ref 40–150)
Anion Gap: 13 mEq/L — ABNORMAL HIGH (ref 3–11)
BUN: 20.4 mg/dL (ref 7.0–26.0)
CO2: 28 meq/L (ref 22–29)
Calcium: 9.8 mg/dL (ref 8.4–10.4)
Chloride: 99 mEq/L (ref 98–109)
Creatinine: 0.8 mg/dL (ref 0.6–1.1)
EGFR: 82 mL/min/{1.73_m2} — AB (ref >90)
GLUCOSE: 92 mg/dL (ref 70–140)
Potassium: 4.8 mEq/L (ref 3.5–5.1)
Sodium: 140 mEq/L (ref 136–145)
Total Bilirubin: 0.27 mg/dL (ref 0.20–1.20)
Total Protein: 7.5 g/dL (ref 6.4–8.3)

## 2014-08-07 MED ORDER — HYDROCODONE-ACETAMINOPHEN 5-325 MG PO TABS
1.0000 | ORAL_TABLET | ORAL | Status: DC | PRN
Start: 2014-08-07 — End: 2014-08-14

## 2014-08-07 MED ORDER — HYDROCODONE-HOMATROPINE 5-1.5 MG/5ML PO SYRP: 5.0000 mL | ORAL_SOLUTION | Freq: Four times a day (QID) | ORAL | Status: DC | PRN

## 2014-08-07 NOTE — Telephone Encounter (Signed)
Gave avs & cal for Jan. See referral notes.

## 2014-08-07 NOTE — Progress Notes (Signed)
[place] [phone]   Fax:([phone]  OFFICE PROGRESS NOTE  Asencion Noble, MD [address] [place] [address]  DIAGNOSIS: Stage IIIA (T3, N2, M0) non-small cell lung cancer, squamous cell carcinoma diagnosed in September of 2015 presenting with central right lower lobe lung mass with postobstructive pneumonitis and mediastinal lymph nodes, was also a suspicious small nodule in the right upper lobe.  PRIOR THERAPY: Status post a course of concurrent chemoradiation with weekly carboplatin for AUC of 2 and paclitaxel 45 MG/M2, last dose was given 06/12/2014 with partial response.  CURRENT THERAPY: None  INTERVAL HISTORY: [name] 69 y.o. female returns to the clinic today for follow-up visit accompanied by her husband. The patient related the previous course of concurrent chemoradiation fairly well with no significant adverse effects except for fatigue. She denied having any significant nausea or vomiting. She has no significant weight loss or night sweats during her treatment. Recently she had fever with right sided chest pain and shortness of breath. The patient was seen at the emergency Department at [facility] on 07/28/2014 and CT angiogram of the chest showed interval development of patchy central groundglass attenuation in the right upper and middle lobes with feature concerning for multifocal pneumonia. She was seen in our office last week and her Levaquin was extended. She was also given a Medrol Dosepak. She has no fever today and her oxygen saturation is 95% on room air. She has completed Levaquin and will take her last dose of steroids tonight. She reports that she is not feeling any better.  MEDICAL HISTORY: Past Medical History  Diagnosis Date  . Thyroid disease     hypothyroidism  . Hx of thyroid cancer   . Status post bilateral breast implants   . Consolidation lung 03/21/14    RLL PER CXR/CT  . Hypertension       just started,not on medication  . Shortness of breath     exertion  . Hypothyroidism   . Mitral valve prolapse   . Anxiety   . Arthritis   . Fibromyalgia   . Complication of anesthesia     headaches,very agitated while waking  up  . Cancer     thyroid  . Lung cancer     ALLERGIES:  is allergic to niacin and related.  MEDICATIONS:  Current Outpatient Prescriptions  Medication Sig Dispense Refill  . Alum & Mag Hydroxide-Simeth (MAGIC MOUTHWASH W/LIDOCAINE) SOLN Take 5 mLs by mouth 3 (three) times daily as needed for mouth pain. 480 mL 1  . amLODipine (NORVASC) 5 MG tablet Take 5 mg by mouth daily.   4  . aspirin EC 81 MG tablet Take 162 mg by mouth at bedtime.     . clonazePAM (KLONOPIN) 0.5 MG tablet Take 0.5 mg by mouth at bedtime as needed for anxiety.    . cyclobenzaprine (FLEXERIL) 5 MG tablet Take 1 tablet (5 mg total) by mouth 3 (three) times daily as needed for muscle spasms. 45 tablet 0  . estradiol (ESTRACE) 0.5 MG tablet Take 0.5 mg by mouth daily.    Marland Kitchen HYDROcodone-acetaminophen (NORCO) 5-325 MG per tablet Take 1-2 tablets by mouth every 4 (four) hours as needed. 20 tablet 0  . HYDROcodone-homatropine (HYCODAN) 5-1.5 MG/5ML syrup Take 5 mLs by mouth every 6 (six) hours as needed for cough. 240 mL 0  . ibuprofen (ADVIL,MOTRIN) 100 MG tablet Take 200 mg by mouth every 6 (six) hours  as needed for fever.    Marland Kitchen levofloxacin (LEVAQUIN) 750 MG tablet Take 1 tablet (750 mg total) by mouth daily. 4 tablet 0  . levothyroxine (SYNTHROID, LEVOTHROID) 125 MCG tablet Take 250 mcg by mouth daily before breakfast.     . medroxyPROGESTERone (PROVERA) 2.5 MG tablet Take 2.5 mg by mouth daily.    . methylPREDNIsolone (MEDROL DOSPACK) 4 MG tablet follow package directions 21 tablet 0  . morphine (MSIR) 15 MG tablet Take 1/2 tablet to 1 tablet po q 6 hours prn pain (Patient taking differently: Take 7.5-15 mg by mouth every 6 (six) hours as needed for moderate pain or severe pain. Take 1/2  tablet to 1 tablet po q 6 hours prn pain) 75 tablet 0  . nicotine (NICODERM CQ - DOSED IN MG/24 HOURS) 14 mg/24hr patch Place 14 mg onto the skin daily.    . prochlorperazine (COMPAZINE) 10 MG tablet Take 1 tablet (10 mg total) by mouth every 6 (six) hours as needed for nausea or vomiting. (Patient not taking: Reported on 08/01/2014) 30 tablet 2   No current facility-administered medications for this visit.    SURGICAL HISTORY:  Past Surgical History  Procedure Laterality Date  . Thyroidectomy    . Cataract extraction Right   . Breast enhancement surgery    . Retinal detachment surgery    . Eye surgery    . Pars plana vitrectomy w/ repair of macular hole    . Back surgery      times 2  . Tonsillectomy    . Cervical cone biopsy    . Video bronchoscopy with endobronchial ultrasound N/A 04/07/2014    Procedure: VIDEO BRONCHOSCOPY WITH ENDOBRONCHIAL ULTRASOUND;  Surgeon: Melrose Nakayama, MD;  Location: [facility];  Service: Thoracic;  Laterality: N/A;    REVIEW OF SYSTEMS:  Constitutional: positive for fatigue Eyes: negative Ears, nose, mouth, throat, and face: negative Respiratory: positive for cough, dyspnea on exertion, sputum and wheezing Cardiovascular: negative Gastrointestinal: negative Genitourinary:negative Integument/breast: negative Hematologic/lymphatic: negative Musculoskeletal:negative Neurological: negative Behavioral/Psych: negative Endocrine: negative Allergic/Immunologic: negative   PHYSICAL EXAMINATION: General appearance: alert, cooperative, fatigued and no distress Head: Normocephalic, without obvious abnormality, atraumatic Neck: no adenopathy, no JVD, supple, symmetrical, trachea midline and thyroid not enlarged, symmetric, no tenderness/mass/nodules Lymph nodes: Cervical, supraclavicular, and axillary nodes normal. Resp: wheezes bilaterally Back: symmetric, no curvature. ROM normal. No CVA tenderness. Cardio: regular rate and rhythm, S1, S2 normal, no  murmur, click, rub or gallop GI: soft, non-tender; bowel sounds normal; no masses,  no organomegaly Extremities: extremities normal, atraumatic, no cyanosis or edema Neurologic: Alert and oriented X 3, normal strength and tone. Normal symmetric reflexes. Normal coordination and gait  ECOG PERFORMANCE STATUS: 1 - Symptomatic but completely ambulatory  Blood pressure 149/97, pulse 113, temperature 98.7 F (37.1 C), temperature source Oral, resp. rate 18, height 5\' 8"  (1.727 m), weight 146 lb 11.2 oz (66.543 kg), SpO2 95 %.  LABORATORY DATA: Lab Results  Component Value Date   WBC 14.9* 08/07/2014   HGB 12.7 08/07/2014   HCT 39.1 08/07/2014   MCV 94.0 08/07/2014   PLT 487* 08/07/2014      Chemistry      Component Value Date/Time   NA 140 08/07/2014 1331   NA 138 07/28/2014 1611   K 4.8 08/07/2014 1331   K 3.9 07/28/2014 1611   CL 105 07/28/2014 1611   CO2 28 08/07/2014 1331   CO2 26 07/28/2014 1611   BUN 20.4 08/07/2014 1331   BUN  12 07/28/2014 1611   CREATININE 0.8 08/07/2014 1331   CREATININE 0.53 07/28/2014 1611      Component Value Date/Time   CALCIUM 9.8 08/07/2014 1331   CALCIUM 9.3 07/28/2014 1611   ALKPHOS 138 08/07/2014 1331   ALKPHOS 197* 04/05/2014 1336   AST 26 08/07/2014 1331   AST 18 04/05/2014 1336   ALT 37 08/07/2014 1331   ALT 33 04/05/2014 1336   BILITOT 0.27 08/07/2014 1331   BILITOT 0.3 04/05/2014 1336       RADIOGRAPHIC STUDIES: Ct Chest W Contrast  07/12/2014   CLINICAL DATA:  Followup lung cancer.  EXAM: CT CHEST WITH CONTRAST  TECHNIQUE: Multidetector CT imaging of the chest was performed during intravenous contrast administration.  CONTRAST:  18mL OMNIPAQUE IOHEXOL 300 MG/ML  SOLN  COMPARISON:  03/23/2014  FINDINGS: Mediastinum: Right paratracheal lymph node measures 4 mm, image 19/series 2. Previously 6 mm. Low right paratracheal lymph node measures 6 mm, image 25/series 2. Previously 8 mm. Sub- carinal lymph node measures 6 mm, image 33/  series 2. Previously 13 mm. Resolution of previous right hilar adenopathy.  Lungs/Pleura: Significant interval improvement in aeration to the right lower lobe with markedly diminished tumor volume. The only measurable area measures 2.3 x 1.2 cm, image 36/series 5. There are a few ground-glass attenuating nodules within the right upper lobe, similar to previous exam. No new or enlarging pulmonary nodules identified.  Upper Abdomen: Hypervascular liver lesion within the right hepatic lobe measures 1.1 cm. This is unchanged from the previous exam. Surrounding increased attenuation and retraction of the overlying liver capsule is noted.  Musculoskeletal: Review of the visualized osseous structures shows no aggressive lytic or sclerotic bone lesions.  IMPRESSION: 1. Overall interval response to therapy. There has been significant decrease in tumor volume of within the right lower lobe with associated improved aeration to the right lung. 2. Decrease in size of previous enlarged mediastinal and right hilar lymph nodes. 3. Indeterminate lesion is identified within the right hepatic lobe. On today's study this appears hypervascular. This is not changed from the PET-CT dated 04/19/2014 with there was no significant FDG uptake associated with this structure. This may be better assessed with contrast enhanced MRI of the liver.   Electronically Signed   By: Kerby Moors M.D.   On: 07/12/2014 12:33   Ct Angio Chest Pe W/cm &/or Wo Cm  07/28/2014   CLINICAL DATA:  Initial encounter for several day history of chest pain and shortness of breath. Personal history on an thyroid cancer apparent  EXAM: CT ANGIOGRAPHY CHEST WITH CONTRAST  TECHNIQUE: Multidetector CT imaging of the chest was performed using the standard protocol during bolus administration of intravenous contrast. Multiplanar CT image reconstructions and MIPs were obtained to evaluate the vascular anatomy.  CONTRAST:  151mL OMNIPAQUE IOHEXOL 350 MG/ML SOLN   COMPARISON:  07/12/2014  FINDINGS: Soft tissue / Mediastinum: There is no axillary lymphadenopathy. No mediastinal or hilar lymphadenopathy. Heart size is normal. No pericardial effusion.  Lungs / Pleura: Lung windows show interlobular thickening with areas of patchy ground-glass attenuation. This is new in the interval and has a central right upper lobe predominance with right middle lobe involvement. There is persistent right lower lobe volume loss with confluent airspace consolidation in the right lower lobe, progressed in the interval.  Biapical pleural parenchymal scarring is stable. 7 mm left upper lobe nodule seen on image 14 is stable in the interval. No substantial pleural effusion. No pulmonary edema.  Bones: Bone windows  reveal no worrisome lytic or sclerotic osseous lesions.  Upper Abdomen: 11 mm hypervascular lesion in the right liver is again noted, unchanged.  Review of the MIP images confirms the above findings.  IMPRESSION: Interval development of patchy central ground-glass attenuation in the right upper and middle lobes. Given the unilateral involvement, imaging features may be related to multi focal pneumonia.  The right lower lobe collapse/consolidation has progressed in the interval (compare image 59 series 5 today thick image 41 series 5 previously). This may represent evolving radiation change or disease progression and continued close attention is recommended.  11 mm hypervascular liver lesion is unchanged.   Electronically Signed   By: Misty Stanley M.D.   On: 07/28/2014 17:11    ASSESSMENT AND PLAN: This is a very pleasant 69 year old white female with history of stage IIIA non-small cell lung cancer status post a course of concurrent chemoradiation with weekly carboplatin and paclitaxel with significant improvement in her disease based on the CT scan result of 07/12/2014.  She has continued wheezing, cough, and dyspnea on exertion. Her symptoms did not improve with a course of  antibiotics and steroids. The patient was seen and examined with Dr. Julien Nordmann. We have recommended that she proceed with a repeat chest x-ray today. We will also get her an urgent appointment with pulmonology. 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  For now, we will hold off on chemotherapy until her respiratory symptoms improved. Dr. Julien Nordmann has previously discussed treatment options including continuous observation and close monitoring with repeat his scan versus consideration of 3 cycles of consolidation chemotherapy with either carboplatin and paclitaxel or carboplatin and gemcitabine to decrease the risk of alopecia.  The patient is interested in the consolidation chemotherapy and this will be discussed in more details in 2 weeks after improvement in her current condition.  She will come back for follow-up visit in two weeks for reevaluation. She was also advised to go immediately to the emergency department if she has worsening of her condition including worsening dyspnea, chest pain or fever.  She was advised to call immediately if she has any concerning symptoms in the interval. The patient voices understanding of current disease status and treatment options and is in agreement with the current care plan.  All questions were answered. The patient knows to call the clinic with any problems, questions or concerns. We can certainly see the patient much sooner if necessary.  The patient was seen and examined with Dr. Julien Nordmann.  Mikey Bussing, DNP, AGPCNP-BC, AOCNP  ADDENDUM: Hematology/Oncology Attending: I had a face to face encounter with the patient today. I recommended her care plan.  This is a very pleasant 69 years old white female with history of stage IIIa non-small cell lung cancer who recently completed course of concurrent chemoradiation with weekly carboplatin and paclitaxel. She has been complaining of significant dyspnea over the last 2 weeks. The patient was treated with 10  days of Levaquin in addition to Medrol Dosepak but no significant improvement in her condition. Her recent CT angiogram of the chest showed no evidence for pulmonary embolism but there was some evidence for radiation pneumonitis. I will repeat chest x-ray today to rule out any other inflammatory process. I discussed with the patient few options including referral to a pulmonologist for evaluation before considering him for high-dose steroid for the questionable radiation induced pneumonitis. The patient agreed to the current plan and she is expected to see Dr. Melvyn Novas next week. I would see if  we can get her an earlier appointment. I would see her back for follow-up visit in 2 weeks for reevaluation and discussion of consolidation chemotherapy if she has improvement in her condition. She was advised to call immediately if she has any other concerning symptoms in the interval.  Disclaimer: This note was dictated with voice recognition software. Similar sounding words can inadvertently be transcribed and may be missed upon review. Eilleen Kempf., MD 08/07/2014

## 2014-08-08 ENCOUNTER — Ambulatory Visit: Payer: BLUE CROSS/BLUE SHIELD | Admitting: Nurse Practitioner

## 2014-08-08 ENCOUNTER — Other Ambulatory Visit: Payer: BLUE CROSS/BLUE SHIELD

## 2014-08-09 ENCOUNTER — Telehealth: Payer: Self-pay | Admitting: Internal Medicine

## 2014-08-09 ENCOUNTER — Telehealth: Payer: Self-pay | Admitting: Medical Oncology

## 2014-08-09 NOTE — Telephone Encounter (Signed)
CXR given to pt and per Julien Nordmann I called Dr Gustavus Bryant office to move up pt appt. CXR faxed to Dr wert and his nurse.

## 2014-08-09 NOTE — Telephone Encounter (Signed)
lmtcb x1 

## 2014-08-09 NOTE — Telephone Encounter (Signed)
LMTCB

## 2014-08-09 NOTE — Telephone Encounter (Signed)
[phone] diane Bell returning call wait and call after 8:30 tomorrow

## 2014-08-10 ENCOUNTER — Telehealth: Payer: Self-pay | Admitting: Medical Oncology

## 2014-08-10 ENCOUNTER — Telehealth: Payer: Self-pay

## 2014-08-10 NOTE — Telephone Encounter (Signed)
Patient called to inquire if there is anything else that can be done to help her with cough.Chest x-ray from 08/07/14 reveals multifocal airspace disease and radiation pneumonitis and infection.she has been started on levoquin, prednisone and hycodan.Patient is scheduled to see Dr. Melvyn Novas on 08/16/14.Reviewed all this with Dr.Kinard and this is what our office would do.told patient I will let Dr.Wentworth know of status on her return to office next week and if there is anything else that we can do to help I will notify her.

## 2014-08-10 NOTE — Telephone Encounter (Signed)
I eft message for Dr Morrison Old nurse to call me and called pt to let her know am waiting on call back from Dr Melvyn Novas. I also told her to go to ED if her symptoms worsen.

## 2014-08-10 NOTE — Telephone Encounter (Signed)
Diane from Dr Arvilla Market office calling back - [phone]

## 2014-08-11 NOTE — Telephone Encounter (Signed)
lmtcb x1 

## 2014-08-11 NOTE — Telephone Encounter (Signed)
Received call from Dr Gustavus Bryant office. They can see patient Monday 08/14/14 regarding recent CXR.  Attempted to call pt, when to vm.  Gave her information regarding appointment and to call Dr Gustavus Bryant office back if she has any questions about the appt.

## 2014-08-11 NOTE — Telephone Encounter (Signed)
Return call can be reached @ [phone] .Hillery Hunter

## 2014-08-11 NOTE — Telephone Encounter (Signed)
Spoke with Dr Worthy Flank nurse  Consult rescheduled to 08/14/14 at 1:30  Nothing further needed

## 2014-08-14 ENCOUNTER — Other Ambulatory Visit (INDEPENDENT_AMBULATORY_CARE_PROVIDER_SITE_OTHER): Payer: BLUE CROSS/BLUE SHIELD

## 2014-08-14 ENCOUNTER — Encounter: Payer: Self-pay | Admitting: Internal Medicine

## 2014-08-14 ENCOUNTER — Ambulatory Visit (INDEPENDENT_AMBULATORY_CARE_PROVIDER_SITE_OTHER): Payer: BLUE CROSS/BLUE SHIELD | Admitting: Internal Medicine

## 2014-08-14 VITALS — BP 124/74 | HR 118 | Ht 68.5 in | Wt 148.0 lb

## 2014-08-14 DIAGNOSIS — R918 Other nonspecific abnormal finding of lung field: Secondary | ICD-10-CM

## 2014-08-14 LAB — SEDIMENTATION RATE: SED RATE: 93 mm/h — AB (ref 0–22)

## 2014-08-14 MED ORDER — FAMOTIDINE 20 MG PO TABS: ORAL_TABLET | ORAL | Status: DC

## 2014-08-14 MED ORDER — PANTOPRAZOLE SODIUM 40 MG PO TBEC: 40.0000 mg | DELAYED_RELEASE_TABLET | Freq: Every day | ORAL | Status: DC

## 2014-08-14 MED ORDER — PREDNISONE 20 MG PO TABS: 20.0000 mg | ORAL_TABLET | Freq: Every day | ORAL | Status: DC

## 2014-08-14 MED ORDER — TRAMADOL HCL 50 MG PO TABS: ORAL_TABLET | ORAL | Status: DC

## 2014-08-14 NOTE — Progress Notes (Signed)
Subjective:     Patient ID: [name], female   DOB: 12-04-1945,   MRN: [id number]  HPI   30 yowf quit smoking fall 2015 with dx lung cancer 04/07/14  s/p RT and chemo after presenting with cough with at least 50% improvement in cough p rx with last Chemo /RT end of Nov 2015 but then sob  started around xmas  Assoc with dry  coughing  5 so referred to pulmonary clinic 08/14/2014 by Dr Earlie Server with dx of ? RT pneumonitis.   08/14/2014 1st [facility] office visit/ Wert   Chief Complaint  Patient presents with  . Pulmonary Consult    Referred by Dr. Julien Nordmann. Pt c/o DOE since just before Christmas 2015. She also c/o occ CP and non prod cough since DOE started. She gets walking short distances and with showering- tried pred taper and this did not help.     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 No unusual exp hx or h/o childhood pna/ asthma or knowledge of premature birth.  Sleeping ok without nocturnal  or early am exacerbation  of respiratory  c/o's or need for noct saba. Also denies any obvious fluctuation of symptoms with weather or environmental changes or other aggravating or alleviating factors except as outlined above   Current Medications, Allergies, Complete Past Medical History, Past Surgical History, Family History, and Social History were reviewed in [employer] record.  ROS  The following are not active complaints unless bolded sore throat, dysphagia, dental problems, itching, sneezing,  nasal congestion or excess/ purulent secretions, ear ache,   fever, chills, sweats, unintended wt loss, pleuritic or  exertional cp, hemoptysis,  orthopnea pnd or leg swelling, presyncope, palpitations, heartburn, abdominal pain, anorexia, nausea, vomiting, diarrhea  or change in bowel or urinary habits, change in stools or urine, dysuria,hematuria,  rash, arthralgias, visual complaints, headache, numbness weakness or ataxia or problems with walking or coordination,  change in mood/affect or memory.  04/07/14 fob findings Endobronchial mass lesion obstructing the right lower lobe bronchus and some mild extrinsic narrowing of the right middle lobe bronchus. Biopsies revealed non-small-cell carcinoma,         Review of Systems     Objective:   Physical Exam    amb wf nad but harsh cough when breath in  Wt Readings from Last 3 Encounters:  08/14/14 148 lb (67.132 kg)  08/07/14 146 lb 11.2 oz (66.543 kg)  08/01/14 148 lb 3.2 oz (67.223 kg)    Vital signs reviewed    HEENT: nl dentition, turbinates, and orophanx. Nl external ear canals without cough reflex   NECK :  without JVD/Nodes/TM/ nl carotid upstrokes bilaterally   LUNGS: no acc muscle use, clear to A and P bilaterally without cough on insp or exp maneuvers   CV:  RRR  no s3 or murmur or increase in P2, no edema   ABD:  soft and nontender with nl excursion in the supine position. No bruits or organomegaly, bowel sounds nl  MS:  warm without deformities, calf tenderness, cyanosis or clubbing  SKIN: warm and dry without  lesions    NEURO:  alert, approp, no deficits   Labs ordered today / images reviewed include:   cxr from 08/07/14  Patchy R L ung as dz     Lab Results  Component Value Date   ESRSEDRATE 93* 08/14/2014        Assessment:

## 2014-08-14 NOTE — Patient Instructions (Addendum)
Prednisone 20 mg 3 daily until feel better then 2 daily x one week then one daily - take all with breakfast   Try protonix 40 (pantoprazole)   Take 30-60 min before first meal of the day and Pepcid 20 mg one bedtime until cough is completely gone for at least a week without the need for cough suppression.    GERD (REFLUX)  is an extremely common cause of respiratory symptoms just like yours , many times with no obvious heartburn at all.    It can be treated with medication, but also with lifestyle changes including avoidance of late meals, excessive alcohol, smoking cessation, and avoid fatty foods, chocolate, peppermint, colas, red wine, and acidic juices such as orange juice.  NO MINT OR MENTHOL PRODUCTS SO NO COUGH DROPS  USE SUGARLESS CANDY INSTEAD (Jolley ranchers or Stover's or Life Savers) or even ice chips will also do - the key is to swallow to prevent all throat clearing. NO OIL BASED VITAMINS - use powdered substitutes.  Take delsym two tsp every 12 hours and supplement if needed with  tramadol 50 mg up to 2 every 4 hours to suppress the urge to cough. Swallowing water or using ice chips/non mint and menthol containing candies (such as lifesavers or sugarless jolly ranchers) are also effective.  You should rest your voice and avoid activities that you know make you cough.  Once you have eliminated the cough for 3 straight days try reducing the tramadol first,  then the delsym as tolerated.     Please remember to go to the lab  department downstairs for your tests - we will call you with the results when they are available.     Please schedule a follow up office visit in 2 weeks, sooner if needed with cxr on return

## 2014-08-15 ENCOUNTER — Encounter: Payer: Self-pay | Admitting: Internal Medicine

## 2014-08-15 NOTE — Assessment & Plan Note (Signed)
-  FOB 04/07/14 Endobronchial mass lesion obstructing the right lower lobe bronchus and some mild extrinsic narrowing of the right middle lobe bronchus. 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  For now therefore I recommend the patient start with 60 mg daily and taper to 20 mg daily  If not responding in a convincing fashion then next step would be to repeat fob .  

## 2014-08-15 NOTE — Progress Notes (Signed)
Quick Note:  Spoke with pt and notified of results per Dr. Wert. Pt verbalized understanding and denied any questions.  ______ 

## 2014-08-16 ENCOUNTER — Institutional Professional Consult (permissible substitution): Payer: BLUE CROSS/BLUE SHIELD | Admitting: Internal Medicine

## 2014-08-21 ENCOUNTER — Ambulatory Visit: Payer: BLUE CROSS/BLUE SHIELD | Admitting: Oncology

## 2014-08-21 ENCOUNTER — Encounter: Payer: Self-pay | Admitting: Oncology

## 2014-08-21 ENCOUNTER — Other Ambulatory Visit (HOSPITAL_BASED_OUTPATIENT_CLINIC_OR_DEPARTMENT_OTHER): Payer: BLUE CROSS/BLUE SHIELD

## 2014-08-21 ENCOUNTER — Telehealth: Payer: Self-pay | Admitting: Internal Medicine

## 2014-08-21 ENCOUNTER — Ambulatory Visit (HOSPITAL_BASED_OUTPATIENT_CLINIC_OR_DEPARTMENT_OTHER): Payer: BLUE CROSS/BLUE SHIELD | Admitting: Oncology

## 2014-08-21 ENCOUNTER — Other Ambulatory Visit: Payer: BLUE CROSS/BLUE SHIELD

## 2014-08-21 VITALS — BP 178/99 | HR 112 | Temp 97.8°F | Resp 20 | Ht 68.0 in | Wt 147.8 lb

## 2014-08-21 DIAGNOSIS — C3431 Malignant neoplasm of lower lobe, right bronchus or lung: Secondary | ICD-10-CM

## 2014-08-21 DIAGNOSIS — C3411 Malignant neoplasm of upper lobe, right bronchus or lung: Secondary | ICD-10-CM

## 2014-08-21 DIAGNOSIS — J700 Acute pulmonary manifestations due to radiation: Secondary | ICD-10-CM

## 2014-08-21 DIAGNOSIS — C3491 Malignant neoplasm of unspecified part of right bronchus or lung: Secondary | ICD-10-CM

## 2014-08-21 LAB — COMPREHENSIVE METABOLIC PANEL (CC13)
ALT: 26 U/L (ref 0–55)
AST: 18 U/L (ref 5–34)
Albumin: 3.6 g/dL (ref 3.5–5.0)
Alkaline Phosphatase: 115 U/L (ref 40–150)
Anion Gap: 14 mEq/L — ABNORMAL HIGH (ref 3–11)
BUN: 17.3 mg/dL (ref 7.0–26.0)
CALCIUM: 9.8 mg/dL (ref 8.4–10.4)
CHLORIDE: 104 meq/L (ref 98–109)
CO2: 24 meq/L (ref 22–29)
CREATININE: 0.7 mg/dL (ref 0.6–1.1)
Glucose: 90 mg/dl (ref 70–140)
Potassium: 4.1 mEq/L (ref 3.5–5.1)
Sodium: 142 mEq/L (ref 136–145)
Total Bilirubin: 0.43 mg/dL (ref 0.20–1.20)
Total Protein: 7.1 g/dL (ref 6.4–8.3)

## 2014-08-21 LAB — CBC WITH DIFFERENTIAL/PLATELET
BASO%: 0.1 % (ref 0.0–2.0)
BASOS ABS: 0 10*3/uL (ref 0.0–0.1)
EOS%: 0.5 % (ref 0.0–7.0)
Eosinophils Absolute: 0.1 10*3/uL (ref 0.0–0.5)
HCT: 39.5 % (ref 34.8–46.6)
HEMOGLOBIN: 13.2 g/dL (ref 11.6–15.9)
LYMPH#: 1.2 10*3/uL (ref 0.9–3.3)
LYMPH%: 7 % — ABNORMAL LOW (ref 14.0–49.7)
MCH: 31.6 pg (ref 25.1–34.0)
MCHC: 33.4 g/dL (ref 31.5–36.0)
MCV: 94.5 fL (ref 79.5–101.0)
MONO#: 1.4 10*3/uL — ABNORMAL HIGH (ref 0.1–0.9)
MONO%: 8.1 % (ref 0.0–14.0)
NEUT#: 14.6 10*3/uL — ABNORMAL HIGH (ref 1.5–6.5)
NEUT%: 84.3 % — ABNORMAL HIGH (ref 38.4–76.8)
Platelets: 391 10*3/uL (ref 145–400)
RBC: 4.18 10*6/uL (ref 3.70–5.45)
RDW: 14.6 % — AB (ref 11.2–14.5)
WBC: 17.3 10*3/uL — ABNORMAL HIGH (ref 3.9–10.3)

## 2014-08-21 NOTE — Progress Notes (Signed)
[place] [phone]   Fax:([phone]) [place], MD [address] [place] [address]  DIAGNOSIS: Stage IIIA (T3, N2, M0) non-small cell lung cancer, squamous cell carcinoma diagnosed in September of 2015 presenting with central right lower lobe lung mass with postobstructive pneumonitis and mediastinal lymph nodes, was also a suspicious small nodule in the right upper lobe.  PRIOR THERAPY: Status post a course of concurrent chemoradiation with weekly carboplatin for AUC of 2 and paclitaxel 45 MG/M2, last dose was given 06/12/2014 with partial response.  CURRENT THERAPY: None  INTERVAL HISTORY: [name] 69 y.o. female returns to the clinic today for follow-up visit accompanied by her husband. The patient related the previous course of concurrent chemoradiation fairly well with no significant adverse effects except for fatigue. She denied having any significant nausea or vomiting. She has no significant weight loss or night sweats during her treatment. Since her last visit she has been evaluated by pulmonology and diagnosed with radiation pneumonitis. She was started on prednisone 60 mg daily about one week ago. States she took 60 mg of prednisone daily for about 3 days and then started feeling well and subsequently reduced her dose down to 40 mg per day. After about 2 days on the lower dose she noticed worsening of her dyspnea which was consistent with her symptoms prior to even beginning the prednisone. She has spoken with her pulmonologist office and they have recommended increasing her prednisone back up to 60 mg daily. Follow-up with pulmonology is on 08/28/2014. She denies fever, chest pain. Her dyspnea is better at the higher dose of prednisone. No abdominal pain, nausea, vomiting.  MEDICAL HISTORY: Past Medical History  Diagnosis Date  . Thyroid disease     hypothyroidism  . Hx of thyroid cancer   . Status post  bilateral breast implants   . Consolidation lung 03/21/14    RLL PER CXR/CT  . Hypertension     just started,not on medication  . Shortness of breath     exertion  . Hypothyroidism   . Mitral valve prolapse   . Anxiety   . Arthritis   . Fibromyalgia   . Complication of anesthesia     headaches,very agitated while waking  up  . Cancer     thyroid  . Lung cancer     ALLERGIES:  is allergic to niacin and related.  MEDICATIONS:  Current Outpatient Prescriptions  Medication Sig Dispense Refill  . amLODipine (NORVASC) 5 MG tablet Take 5 mg by mouth daily.   4  . aspirin EC 81 MG tablet Take 162 mg by mouth at bedtime.     . clonazePAM (KLONOPIN) 0.5 MG tablet Take 0.5 mg by mouth at bedtime as needed for anxiety.    Marland Kitchen estradiol (ESTRACE) 0.5 MG tablet Take 0.5 mg by mouth daily.    . famotidine (PEPCID) 20 MG tablet One at bedtime 30 tablet 2  . ibuprofen (ADVIL,MOTRIN) 100 MG tablet Take 200 mg by mouth every 6 (six) hours as needed for fever.    . levothyroxine (SYNTHROID, LEVOTHROID) 125 MCG tablet Take 250 mcg by mouth daily before breakfast.     . medroxyPROGESTERone (PROVERA) 2.5 MG tablet Take 2.5 mg by mouth daily.    . nicotine (NICODERM CQ - DOSED IN MG/24 HOURS) 14 mg/24hr patch Place 14 mg onto the skin daily.    . pantoprazole (PROTONIX) 40 MG tablet Take 1 tablet (40  mg total) by mouth daily. Take 30-60 min before first meal of the day 30 tablet 2  . predniSONE (DELTASONE) 20 MG tablet Take 20 mg by mouth daily with breakfast. Take as directed    . traMADol (ULTRAM) 50 MG tablet 1-2 every 4 hours as needed for cough or pain 40 tablet 0   No current facility-administered medications for this visit.    SURGICAL HISTORY:  Past Surgical History  Procedure Laterality Date  . Thyroidectomy    . Cataract extraction Right   . Breast enhancement surgery    . Retinal detachment surgery    . Eye surgery    . Pars plana vitrectomy w/ repair of macular hole    . Back  surgery      times 2  . Tonsillectomy    . Cervical cone biopsy    . Video bronchoscopy with endobronchial ultrasound N/A 04/07/2014    Procedure: VIDEO BRONCHOSCOPY WITH ENDOBRONCHIAL ULTRASOUND;  Surgeon: Melrose Nakayama, MD;  Location: [facility];  Service: Thoracic;  Laterality: N/A;    REVIEW OF SYSTEMS:  Constitutional: positive for fatigue Eyes: negative Ears, nose, mouth, throat, and face: negative Respiratory: positive for cough and dyspnea on exertion, negative for hemoptysis, sputum and wheezing Cardiovascular: negative Gastrointestinal: negative Genitourinary:negative Integument/breast: negative Hematologic/lymphatic: negative Musculoskeletal:negative Neurological: negative Behavioral/Psych: negative Endocrine: negative Allergic/Immunologic: negative   PHYSICAL EXAMINATION: General appearance: alert, cooperative, fatigued and no distress Head: Normocephalic, without obvious abnormality, atraumatic Neck: no adenopathy, no JVD, supple, symmetrical, trachea midline and thyroid not enlarged, symmetric, no tenderness/mass/nodules Lymph nodes: Cervical, supraclavicular, and axillary nodes normal. Resp: clear to auscultation bilaterally Back: symmetric, no curvature. ROM normal. No CVA tenderness. Cardio: regular rate and rhythm, S1, S2 normal, no murmur, click, rub or gallop GI: soft, non-tender; bowel sounds normal; no masses,  no organomegaly Extremities: extremities normal, atraumatic, no cyanosis or edema Neurologic: Alert and oriented X 3, normal strength and tone. Normal symmetric reflexes. Normal coordination and gait  ECOG PERFORMANCE STATUS: 1 - Symptomatic but completely ambulatory  Blood pressure 178/99, pulse 112, temperature 97.8 F (36.6 C), temperature source Oral, resp. rate 20, height 5\' 8"  (1.727 m), weight 147 lb 12.8 oz (67.042 kg), SpO2 96 %.  LABORATORY DATA: Lab Results  Component Value Date   WBC 17.3* 08/21/2014   HGB 13.2 08/21/2014   HCT  39.5 08/21/2014   MCV 94.5 08/21/2014   PLT 391 08/21/2014      Chemistry      Component Value Date/Time   NA 142 08/21/2014 1414   NA 138 07/28/2014 1611   K 4.1 08/21/2014 1414   K 3.9 07/28/2014 1611   CL 105 07/28/2014 1611   CO2 24 08/21/2014 1414   CO2 26 07/28/2014 1611   BUN 17.3 08/21/2014 1414   BUN 12 07/28/2014 1611   CREATININE 0.7 08/21/2014 1414   CREATININE 0.53 07/28/2014 1611      Component Value Date/Time   CALCIUM 9.8 08/21/2014 1414   CALCIUM 9.3 07/28/2014 1611   ALKPHOS 115 08/21/2014 1414   ALKPHOS 197* 04/05/2014 1336   AST 18 08/21/2014 1414   AST 18 04/05/2014 1336   ALT 26 08/21/2014 1414   ALT 33 04/05/2014 1336   BILITOT 0.43 08/21/2014 1414   BILITOT 0.3 04/05/2014 1336       RADIOGRAPHIC STUDIES: Dg Chest 2 View  08/07/2014   CLINICAL DATA:  Cough. Wheezing. Fever. Right lung non-small cell carcinoma. Recent radiation therapy.  EXAM: CHEST  2 VIEW  COMPARISON:  Chest CT on 07/28/2014  FINDINGS: Multifocal patchy airspace disease in the upper and lower right lung is again demonstrated, and could be due to radiation pneumonitis or infection. Left lung remains clear. No evidence of pleural effusion. Heart size is normal.  IMPRESSION: Multifocal right lung airspace disease, with differential diagnosis including radiation pneumonitis and infection.   Electronically Signed   By: Earle Gell M.D.   On: 08/07/2014 16:23   Ct Angio Chest Pe W/cm &/or Wo Cm  07/28/2014   CLINICAL DATA:  Initial encounter for several day history of chest pain and shortness of breath. Personal history on an thyroid cancer apparent  EXAM: CT ANGIOGRAPHY CHEST WITH CONTRAST  TECHNIQUE: Multidetector CT imaging of the chest was performed using the standard protocol during bolus administration of intravenous contrast. Multiplanar CT image reconstructions and MIPs were obtained to evaluate the vascular anatomy.  CONTRAST:  147mL OMNIPAQUE IOHEXOL 350 MG/ML SOLN  COMPARISON:   07/12/2014  FINDINGS: Soft tissue / Mediastinum: There is no axillary lymphadenopathy. No mediastinal or hilar lymphadenopathy. Heart size is normal. No pericardial effusion.  Lungs / Pleura: Lung windows show interlobular thickening with areas of patchy ground-glass attenuation. This is new in the interval and has a central right upper lobe predominance with right middle lobe involvement. There is persistent right lower lobe volume loss with confluent airspace consolidation in the right lower lobe, progressed in the interval.  Biapical pleural parenchymal scarring is stable. 7 mm left upper lobe nodule seen on image 14 is stable in the interval. No substantial pleural effusion. No pulmonary edema.  Bones: Bone windows reveal no worrisome lytic or sclerotic osseous lesions.  Upper Abdomen: 11 mm hypervascular lesion in the right liver is again noted, unchanged.  Review of the MIP images confirms the above findings.  IMPRESSION: Interval development of patchy central ground-glass attenuation in the right upper and middle lobes. Given the unilateral involvement, imaging features may be related to multi focal pneumonia.  The right lower lobe collapse/consolidation has progressed in the interval (compare image 59 series 5 today thick image 41 series 5 previously). This may represent evolving radiation change or disease progression and continued close attention is recommended.  11 mm hypervascular liver lesion is unchanged.   Electronically Signed   By: Misty Stanley M.D.   On: 07/28/2014 17:11    ASSESSMENT AND PLAN: This is a very pleasant 69 year old white female with history of stage IIIA non-small cell lung cancer status post a course of concurrent chemoradiation with weekly carboplatin and paclitaxel with significant improvement in her disease based on the CT scan result of 07/12/2014.  Patient is currently being followed by pulmonology for radiation pneumonitis. We have recommended that she continue her  prednisone 60 mg daily as directed by Dr. Melvyn Novas. She will follow-up with him on 08/28/2014 for further instructions.  The patient has mentioned that she needs dental work done. In particular she has an abscess below a crown that needs to be addressed. We have recommended that she proceed with this dental work before we consider giving her chemotherapy.  For now, we will hold off on chemotherapy until her respiratory symptoms improved and her dental work has been completed. Dr. Julien Nordmann has previously discussed treatment options including continuous observation and close monitoring with repeat his scan versus consideration of 3 cycles of consolidation chemotherapy with either carboplatin and paclitaxel or carboplatin and gemcitabine to decrease the risk of alopecia.  The patient is interested in the consolidation  chemotherapy.   We will plan to see her back for follow-up in approximately 4 weeks.  She was advised to call immediately if she has any concerning symptoms in the interval. The patient voices understanding of current disease status and treatment options and is in agreement with the current care plan.  All questions were answered. The patient knows to call the clinic with any problems, questions or concerns. We can certainly see the patient much sooner if necessary.  The patient was seen and examined with Dr. Julien Nordmann.  Mikey Bussing, DNP, AGPCNP-BC, AOCNP  ADDENDUM: Hematology/Oncology Attending: I had a face to face encounter with the patient today. I recommended her care plan. This is a very pleasant 69 years old white female with a stage IIIa non-small cell lung cancer status post a course of concurrent chemoradiation with weekly carboplatin and paclitaxel. She tolerated her treatment fairly well but recently she started complaining of significant shortness of breath and cough. This was suspicious for radiation induced pneumonitis and the patient was seen recently by Dr. Melvyn Novas. She was  started on treatment with high-dose prednisone 60 mg by mouth daily. She did well for few days but when she decreased her dose to 40 mg by mouth daily, her symptoms recurred. Dr. Melvyn Novas advised the patient to increase her dose again to 60 mg by mouth daily. She is expected to have slow taper of this prednisone course. I would see her back for follow-up visit in around 4 weeks for reevaluation and discussion of consolidation chemotherapy if her conditions improved, otherwise she will be on observation and close monitoring with repeat scan. The patient agreed with the current plan. She was advised to call immediately if she has any concerning symptoms in the interval.  Disclaimer: This note was dictated with voice recognition software. Similar sounding words can inadvertently be transcribed and may be missed upon review. Eilleen Kempf., MD 08/21/2014

## 2014-08-21 NOTE — Telephone Encounter (Signed)
Spoke with pt to advise of MW's recs.  She understands.  Nothing further needed.

## 2014-08-21 NOTE — Telephone Encounter (Signed)
The rule we use is if worse with taper start back over and follow the prev instructions verbatim re dosing  eg 60 mg daily until better etc

## 2014-08-21 NOTE — Telephone Encounter (Signed)
Pt seen by MW on 08/14/14 with the following instructions:  Patient Instructions     Prednisone 20 mg 3 daily until feel better then 2 daily x one week then one daily - take all with breakfast   Try protonix 40 (pantoprazole) Take 30-60 min before first meal of the day and Pepcid 20 mg one bedtime until cough is completely gone for at least a week without the need for cough suppression.   GERD (REFLUX) is an extremely common cause of respiratory symptoms just like yours , many times with no obvious heartburn at all.   It can be treated with medication, but also with lifestyle changes including avoidance of late meals, excessive alcohol, smoking cessation, and avoid fatty foods, chocolate, peppermint, colas, red wine, and acidic juices such as orange juice.  NO MINT OR MENTHOL PRODUCTS SO NO COUGH DROPS  USE SUGARLESS CANDY INSTEAD (Jolley ranchers or Stover's or Life Savers) or even ice chips will also do - the key is to swallow to prevent all throat clearing. NO OIL BASED VITAMINS - use powdered substitutes.  Take delsym two tsp every 12 hours and supplement if needed with tramadol 50 mg up to 2 every 4 hours to suppress the urge to cough. Swallowing water or using ice chips/non mint and menthol containing candies (such as lifesavers or sugarless jolly ranchers) are also effective. You should rest your voice and avoid activities that you know make you cough.  Once you have eliminated the cough for 3 straight days try reducing the tramadol first, then the delsym as tolerated.   Please remember to go to the lab department downstairs for your tests - we will call you with the results when they are available.    Please schedule a follow up office visit in 2 weeks, sooner if needed with cxr on return    -------  Spoke with pt who reports she took 60 mg of prednisone qd x 4 days.  Breathing improved, so she cut it down to 40 mg qd.  She was on 40 mg qd x 2 days and now notices DOE  is back to where it was 1 wk ago.  C/o "barely being able to catch my breath with barely any activity" like walking up a few stairs and showering.  Pt states she has continued to follow through with rest of recs above and would like to know how she should proceed with prednisone -- ? Go back to 60 mg daily since breathing worsened after going down to 40 mg?  Dr. Melvyn Novas, pls advise.  Thank you.  Pt has a pending OV with MW on 2/1/6 with cxr.

## 2014-08-22 ENCOUNTER — Telehealth: Payer: Self-pay | Admitting: Internal Medicine

## 2014-08-22 NOTE — Telephone Encounter (Signed)
Lft msg for pt confirming labs/ov per 01/26 POF, mailed sch to pt..... KJ

## 2014-08-28 ENCOUNTER — Ambulatory Visit (INDEPENDENT_AMBULATORY_CARE_PROVIDER_SITE_OTHER)
Admission: RE | Admit: 2014-08-28 | Discharge: 2014-08-28 | Disposition: A | Discharge status: Home / Self Care | Payer: BLUE CROSS/BLUE SHIELD | Source: Ambulatory Visit | Attending: Internal Medicine | Admitting: Internal Medicine

## 2014-08-28 ENCOUNTER — Ambulatory Visit (INDEPENDENT_AMBULATORY_CARE_PROVIDER_SITE_OTHER): Payer: BLUE CROSS/BLUE SHIELD | Admitting: Internal Medicine

## 2014-08-28 ENCOUNTER — Telehealth: Payer: Self-pay | Admitting: Internal Medicine

## 2014-08-28 ENCOUNTER — Encounter: Payer: Self-pay | Admitting: Internal Medicine

## 2014-08-28 VITALS — BP 108/70 | HR 105 | Temp 98.3°F | Ht 68.0 in | Wt 149.0 lb

## 2014-08-28 DIAGNOSIS — R918 Other nonspecific abnormal finding of lung field: Secondary | ICD-10-CM

## 2014-08-28 MED ORDER — CLONAZEPAM 0.5 MG PO TABS: ORAL_TABLET | ORAL | Status: DC

## 2014-08-28 NOTE — Telephone Encounter (Signed)
I need her ESR back first and looks like she left s going to lab so for now no change pred rx

## 2014-08-28 NOTE — Progress Notes (Signed)
Subjective:     Patient ID: [name], female   DOB: [birth year],   MRN: [id number]    Brief patient profile:  32 yowf quit smoking fall 2015 with dx lung cancer 04/07/14 * s/p RT and chemo after presenting with cough with at least 50% improvement in cough p rx with last Chemo /RT end of Nov 2015 but then sob  started around xmas  Assoc with dry  coughing  5 so referred to pulmonary clinic 08/14/2014 by Dr Earlie Server with dx of ? RT pneumonitis.  *04/07/14 fob findings Endobronchial mass lesion obstructing the right lower lobe bronchus and some mild extrinsic narrowing of the right middle lobe bronchus. Biopsies revealed non-small-cell carcinoma,       History of Present Illness  08/14/2014 1st [facility] office visit/ [name]   Chief Complaint  Patient presents with  . Pulmonary Consult    Referred by Dr. Julien Nordmann. Pt c/o DOE since just before Christmas 2015. She also c/o occ CP and non prod cough since DOE started. She gets walking short distances and with showering- tried pred taper and this did not help.     Initially felt to have cap rx with abx  then dx as possible RT pneumonitis and rx prednisone and continues to worsen since stopped steroids 08/07/14 Onset was insidious, pattern is progressive to point of doe x 100 ft, cough is day > noct and assoc with mild sweats but no rigors rec Prednisone 20 mg 3 daily until feel better then 2 daily x one week then one daily - take all with breakfast  Try protonix 40 (pantoprazole)   Take 30-60 min before first meal of the day and Pepcid 20 mg one bedtime until cough is completely gone for at least a week without the need for cough suppression. GERD diet   08/28/2014 f/u ov/Kijana Cromie re: RT pneuomonitis/ ESR 93 now on 60 mg daily  Chief Complaint  Patient presents with  . Follow-up    CXR today. Pt states that breathing has worsened in the last 2 weeks - Increased SOB, cough, fatigue, weak, depressed and sweaty/hot. Pt reports daily activities are  harder to do like washing hair and making bed.     Better on 60 mg and worse on 40 mg with ESR before rx = 93  Cough remains dry and day > noct    No obvious day to day or daytime variabilty or assoc chest tightness, subjective wheeze overt sinus or hb symptoms. No unusual exp hx or h/o childhood pna/ asthma or knowledge of premature birth.  Sleeping ok without nocturnal  or early am exacerbation  of respiratory  c/o's or need for noct saba. Also denies any obvious fluctuation of symptoms with weather or environmental changes or other aggravating or alleviating factors except as outlined above   Current Medications, Allergies, Complete Past Medical History, Past Surgical History, Family History, and Social History were reviewed in [employer] record.  ROS  The following are not active complaints unless bolded sore throat, dysphagia, dental problems, itching, sneezing,  nasal congestion or excess/ purulent secretions, ear ache,   fever, chills, sweats, unintended wt loss, pleuritic or exertional cp, hemoptysis,  orthopnea pnd or leg swelling, presyncope, palpitations, heartburn, abdominal pain, anorexia, nausea, vomiting, diarrhea  or change in bowel or urinary habits, change in stools or urine, dysuria,hematuria,  rash, arthralgias, visual complaints, headache, numbness weakness or ataxia or problems with walking or coordination,  change in mood/affect or memory.  Objective:   Physical Exam    amb wf nad no more harsh cough unless  breathes in deeply    08/28/2014          149  Wt Readings from Last 3 Encounters:  08/14/14 148 lb (67.132 kg)  08/07/14 146 lb 11.2 oz (66.543 kg)  08/01/14 148 lb 3.2 oz (67.223 kg)    Vital signs reviewed    HEENT: nl dentition, turbinates, and orophanx. Nl external ear canals without cough reflex   NECK :  without JVD/Nodes/TM/ nl carotid upstrokes bilaterally   LUNGS: no acc muscle use,   A few insp crackles on  R on insp with cough on insp    CV:  RRR  no s3 or murmur or increase in P2, no edema   ABD:  soft and nontender with nl excursion in the supine position. No bruits or organomegaly, bowel sounds nl  MS:  warm without deformities, calf tenderness, cyanosis or clubbing  SKIN: warm and dry without lesions    NEURO:  alert, approp, no deficits      CXR PA and Lateral:   08/28/2014 :     I personally reviewed images and agree with radiology impression as follows:    Further interval increase in pulmonary parenchymal density in the right middle lobe and inferior aspect of the right upper lobe as well as in the right paratracheal region. While this could reflect post radiation change, progressive disease or superimposed pneumonia is not excluded.  ESR ordered but did not go to lab         Assessment:

## 2014-08-28 NOTE — Telephone Encounter (Signed)
LMTCB

## 2014-08-28 NOTE — Telephone Encounter (Signed)
Pt seen in office today, asking about tapering off Prednisone. Pt states that this was discussed during office visit. Pt is currently taking 60mg  of Prednisone daily and has been on this dose x 1 week. Pt would like to know if she needs to taper down dose of remain at 60mg  until seen again. Follow up appt scheduled for 09/25/14.  Please advise Dr Melvyn Novas. Thanks.

## 2014-08-28 NOTE — Patient Instructions (Signed)
Please remember to go to the  x-ray department downstairs for your tests - we will call you with the results when they are available.  Klonopin 0.5 mg take  three times daily bfast/lunch/ supper at hs and take 2 at bedtime    Please schedule a follow up office visit in 4 weeks, sooner if needed

## 2014-08-29 ENCOUNTER — Encounter: Payer: Self-pay | Admitting: Internal Medicine

## 2014-08-29 NOTE — Assessment & Plan Note (Signed)
-  FOB 04/07/14 Endobronchial mass lesion obstructing the right lower lobe bronchus and some mild extrinsic narrowing of the right middle lobe bronchus. Biopsies revealed non-small-cell carcinoma, - chemo RT completed Nov 2015 with 50% improvement in cough  Indication for treatment: Curative  Radiation treatment dates: 05/01/2014-06/13/2014 Site/dose: Right lung, hilum and mediastinal nodes to 60 Gy at 2 Gy per fraction x 20 fractions - CT with patchy as dz only in R lung 07/28/14 - 08/14/2014  Walked RA  2 laps @ 185 ft each stopped due to  Sob no desat / nl pace  - 08/28/2014  Walked RA x 3 laps @ 185 ft each stopped due to end of study, fast pace, no desat, sob with heart racing sensation = P 144  - ESR  93    08/14/2014  > started prednisone at 60 mg daily with floor of 20 mg daily   I had an extended discussion with the patient reviewing all relevant studies completed to date and  lasting 15 to 20 minutes of a 25 minute visit on the following ongoing concerns:   1) this is almost certainly RT pneumonitis  2) need to use esr to adjust pred down > requested to return for repeat 3) some of her symptoms are in excess of objective findings and suggest anxiety so rx with clonopin increased 4) Each maintenance medication was reviewed in detail including most importantly the difference between maintenance and as needed and under what circumstances the prns are to be used.  Please see instructions for details which were reviewed in writing and the patient given a copy.

## 2014-08-29 NOTE — Telephone Encounter (Signed)
LMTCB x2  

## 2014-08-30 NOTE — Telephone Encounter (Signed)
lmtcb for pt.  

## 2014-08-31 NOTE — Telephone Encounter (Signed)
LMTCB for patient.

## 2014-09-01 NOTE — Telephone Encounter (Signed)
Pt returned call [phone]

## 2014-09-01 NOTE — Telephone Encounter (Signed)
lmtcb

## 2014-09-01 NOTE — Telephone Encounter (Signed)
Spoke with pt. States that she has not had her blood work done yet. She will do at the first of the week. Advised her to continue prednisone the way she is doing now and once her results come back we will contact her. She agreed and verbalized understanding.

## 2014-09-04 ENCOUNTER — Other Ambulatory Visit (INDEPENDENT_AMBULATORY_CARE_PROVIDER_SITE_OTHER): Payer: BLUE CROSS/BLUE SHIELD

## 2014-09-04 DIAGNOSIS — R918 Other nonspecific abnormal finding of lung field: Secondary | ICD-10-CM

## 2014-09-04 LAB — SEDIMENTATION RATE: SED RATE: 34 mm/h — AB (ref 0–22)

## 2014-09-04 NOTE — Progress Notes (Signed)
Quick Note:  LMTCB ______ 

## 2014-09-05 ENCOUNTER — Telehealth: Payer: Self-pay | Admitting: Internal Medicine

## 2014-09-05 NOTE — Telephone Encounter (Signed)
Result Note     Call patient : Study is much better, rec try wean prednisone by 10 mg weekly until off but go back to previous dose that worked  ---  I spoke with patient about results and she verbalized understanding and had no questions

## 2014-09-12 ENCOUNTER — Telehealth: Payer: Self-pay | Admitting: Internal Medicine

## 2014-09-12 MED ORDER — TRAMADOL HCL 50 MG PO TABS: ORAL_TABLET | ORAL | Status: DC

## 2014-09-12 NOTE — Telephone Encounter (Signed)
Spoke with pt and advised of Dr Gustavus Bryant recommendations.  Rx phoned to pharmacy.

## 2014-09-12 NOTE — Telephone Encounter (Signed)
Per last eval including esr rec try wean prednisone by 10 mg weekly until off but go back to previous dose that worked  ie I don't know what dose she's actually on now but the plan is to reduce that dose by 10 mg per week and if worsen go back to previous dose and give her enough of that dose to last until next ov whether she uses 10's or 20's makes no difference as long as she does the math

## 2014-09-12 NOTE — Telephone Encounter (Signed)
Ok to rx with tramadol for this prn  Give #80  One - two q 4 h prn

## 2014-09-12 NOTE — Telephone Encounter (Signed)
Received fax from [facility] for Prednisone 20mg .  Patient states she is on a higher dose right now and they are requesting a new prescription to be sent.  According to the last Lab notes, pt was to decrease her prednisone.  Please advise.

## 2014-09-12 NOTE — Telephone Encounter (Signed)
Called and spoke to pt. Pt stated she is currently tapering down the pred dose and is currently at 40mg  per day. Pt stated she has enough prednisone for a few weeks. Pt has upcoming appt with MW on 09/25/2014-advised to be sure to keep appt to be re-assessed. Pt verbalized understanding but stated her non prod cough is coming back x 1 week. Pt stated the tramadol helped but she completed the therapy 3 weeks ago. Pt requesting recs if possible before appt.  MW please advise.

## 2014-09-14 ENCOUNTER — Other Ambulatory Visit: Payer: Self-pay | Admitting: Internal Medicine

## 2014-09-25 ENCOUNTER — Other Ambulatory Visit (INDEPENDENT_AMBULATORY_CARE_PROVIDER_SITE_OTHER): Payer: BLUE CROSS/BLUE SHIELD

## 2014-09-25 ENCOUNTER — Encounter: Payer: Self-pay | Admitting: Internal Medicine

## 2014-09-25 ENCOUNTER — Ambulatory Visit (INDEPENDENT_AMBULATORY_CARE_PROVIDER_SITE_OTHER): Payer: BLUE CROSS/BLUE SHIELD | Admitting: Internal Medicine

## 2014-09-25 DIAGNOSIS — R918 Other nonspecific abnormal finding of lung field: Secondary | ICD-10-CM

## 2014-09-25 LAB — SEDIMENTATION RATE: Sed Rate: 41 mm/hr — ABNORMAL HIGH (ref 0–22)

## 2014-09-25 MED ORDER — CLONAZEPAM 0.5 MG PO TABS: ORAL_TABLET | ORAL | Status: AC

## 2014-09-25 MED ORDER — PREDNISONE 10 MG PO TABS: ORAL_TABLET | ORAL | Status: DC

## 2014-09-25 NOTE — Progress Notes (Signed)
Subjective:     Patient ID: [name], female   DOB: 1945-11-19  MRN: [id number]    Brief patient profile:  53 yowf quit smoking fall 2015 with dx lung cancer 04/07/14 * s/p RT and chemo after presenting with cough with at least 50% improvement in cough p rx with last Chemo /RT end of Nov 2015 but then sob  started around xmas  Assoc with dry  coughing  5 so referred to pulmonary clinic 08/14/2014 by Dr [name] with dx of ? RT pneumonitis.   *04/07/14 fob findings Endobronchial mass lesion obstructing the right lower lobe bronchus and some mild extrinsic narrowing of the right middle lobe bronchus. Biopsies revealed non-small-cell carcinoma,       History of Present Illness  08/14/2014 1st [facility] office visit/ [name]   Chief Complaint  Patient presents with  . Pulmonary Consult    Referred by Dr. Julien Barker. Pt c/o DOE since just before Christmas 2015. She also c/o occ CP and non prod cough since DOE started. She gets walking short distances and with showering- tried pred taper and this did not help.    Initially felt to have cap rx with abx  then dx as possible RT pneumonitis and rx prednisone and continues to worsen since stopped steroids 08/07/14 Onset was insidious, pattern is progressive to point of doe x 100 ft, cough is day > noct and assoc with mild sweats but no rigors rec Prednisone 20 mg 3 daily until feel better then 2 daily x one week then one daily - take all with breakfast  Try protonix 40 (pantoprazole)   Take 30-60 min before first meal of the day and Pepcid 20 mg one bedtime until cough is completely gone for at least a week without the need for cough suppression. GERD diet   08/28/2014 f/u ov/[name] re: RT pneuomonitis/ ESR 93 now on 60 mg daily  Chief Complaint  Patient presents with  . Follow-up    CXR today. Pt states that breathing has worsened in the last 2 weeks - Increased SOB, cough, fatigue, weak, depressed and sweaty/hot. Pt reports daily activities are  harder to do like washing hair and making bed.   Better on 60 mg and worse on 40 mg with ESR before rx = 93 Cough remains dry and day > noct  rec Please remember to go to the  x-ray department downstairs for your tests - we will call you with the results when they are available. Klonopin 0.5 mg take  three times daily bfast/lunch/ supper at hs and take 2 at bedtime    09/25/2014 f/u ov/[name] re: RT fibrosis/ working dx  Risk analyst Complaint  Patient presents with  . Follow-up    Pt states that her breathing is unchanged since her last visit. She c/o feeling jittery for the past wk.   does HT ok, some difficulty with bigger stores like [employer]  Cough much better no more that a couple of ultram per day max On prednisone tapered to 30 mg per day p  Esr returned 34 on 60 mg per day  Feels klonopin not working as well as it was when first started it    No obvious day to day or daytime variabilty or assoc chest tightness, subjective wheeze overt sinus or hb symptoms. No unusual exp hx or h/o childhood pna/ asthma or knowledge of premature birth.  Sleeping ok without nocturnal  or early am exacerbation  of respiratory  c/o's or need for noct  saba. Also denies any obvious fluctuation of symptoms with weather or environmental changes or other aggravating or alleviating factors except as outlined above   Current Medications, Allergies, Complete Past Medical History, Past Surgical History, Family History, and Social History were reviewed in [employer] record.  ROS  The following are not active complaints unless bolded sore throat, dysphagia, dental problems, itching, sneezing,  nasal congestion or excess/ purulent secretions, ear ache,   fever, chills, sweats, unintended wt loss, pleuritic or exertional cp, hemoptysis,  orthopnea pnd or leg swelling, presyncope, palpitations, heartburn, abdominal pain, anorexia, nausea, vomiting, diarrhea  or change in bowel or urinary habits, change  in stools or urine, dysuria,hematuria,  rash, arthralgias, visual complaints, headache, numbness weakness or ataxia or problems with walking or coordination,  change in mood/affect or memory.           Objective:   Physical Exam    amb wf nad no more harsh cough unless  breathes in deeply then does cough but not harshly    08/28/2014          149 >  09/25/2014   155  Wt Readings from Last 3 Encounters:  08/14/14 148 lb (67.132 kg)  08/07/14 146 lb 11.2 oz (66.543 kg)  08/01/14 148 lb 3.2 oz (67.223 kg)    Vital signs reviewed    HEENT: nl dentition, turbinates, and orophanx. Nl external ear canals without cough reflex   NECK :  without JVD/Nodes/TM/ nl carotid upstrokes bilaterally   LUNGS: no acc muscle use,   A few insp crackles on R on insp with cough on insp    CV:  RRR  no s3 or murmur or increase in P2, no edema   ABD:  soft and nontender with nl excursion in the supine position. No bruits or organomegaly, bowel sounds nl  MS:  warm without deformities, calf tenderness, cyanosis or clubbing  SKIN: warm and dry without lesions    NEURO:  alert, approp, no deficits      Labs ordered today /   reviewed include:     Lab Results  Component Value Date   ESRSEDRATE 41* 09/25/2014   ESRSEDRATE 34* 09/04/2014   ESRSEDRATE 93* 08/14/2014               Assessment:

## 2014-09-25 NOTE — Assessment & Plan Note (Signed)
-  FOB 04/07/14 Endobronchial mass lesion obstructing the right lower lobe bronchus and some mild extrinsic narrowing of the right middle lobe bronchus. Biopsies revealed non-small-cell carcinoma, - chemo RT completed Nov 2015 with 50% improvement in cough  Indication for treatment: Curative  Radiation treatment dates: 05/01/2014-06/13/2014 Site/dose: Right lung, hilum and mediastinal nodes to 60 Gy at 2 Gy per fraction x 20 fractions - CT with patchy as dz only in R lung 07/28/14 - 08/14/2014  Walked RA  2 laps @ 185 ft each stopped due to  Sob no desat / nl pace  - 08/28/2014  Walked RA x 3 laps @ 185 ft each stopped due to end of study, fast pace, no desat, sob with heart racing sensation = P 144  - ESR  93    08/14/2014  > started prednisone at 60 mg daily with floor of 20 mg daily  - ESR  41     09/25/2014 on 30 mg per day with plans to taper off      I had an extended discussion with the patient reviewing all relevant studies completed to date and  lasting 15 to 20 minutes of a 25 minute visit on the following ongoing concerns:  The goal with a chronic steroid dependent illness is always arriving at the lowest effective dose that controls the disease/symptoms and not accepting a set "formula" which is based on statistics or guidelines that don't always take into account patient  variability or the natural hx of the dz in every individual patient, which may well vary over time.  For now therefore I recommend the patient maintain  The lowest effective dose so rec continue to taper toward a floor of zero    Each maintenance medication was reviewed in detail including most importantly the difference between maintenance and as needed and under what circumstances the prns are to be used.  Please see instructions for details which were reviewed in writing and the patient given a copy.

## 2014-09-25 NOTE — Patient Instructions (Signed)
Continue to wean off prednisone but if cough or breathing worsen go back to the previous dose that worked  Please remember to go to the lab   department downstairs for your tests - we will call you with the results when they are available.    Please schedule a follow up office visit in 6 weeks, call sooner if needed

## 2014-09-26 ENCOUNTER — Other Ambulatory Visit (HOSPITAL_BASED_OUTPATIENT_CLINIC_OR_DEPARTMENT_OTHER): Payer: BLUE CROSS/BLUE SHIELD

## 2014-09-26 ENCOUNTER — Encounter: Payer: Self-pay | Admitting: Internal Medicine

## 2014-09-26 ENCOUNTER — Ambulatory Visit (HOSPITAL_BASED_OUTPATIENT_CLINIC_OR_DEPARTMENT_OTHER): Payer: BLUE CROSS/BLUE SHIELD | Admitting: Internal Medicine

## 2014-09-26 ENCOUNTER — Telehealth: Payer: Self-pay | Admitting: Internal Medicine

## 2014-09-26 VITALS — BP 143/84 | HR 109 | Temp 98.5°F | Resp 19 | Ht 68.0 in | Wt 158.6 lb

## 2014-09-26 DIAGNOSIS — C3431 Malignant neoplasm of lower lobe, right bronchus or lung: Secondary | ICD-10-CM

## 2014-09-26 DIAGNOSIS — C3491 Malignant neoplasm of unspecified part of right bronchus or lung: Secondary | ICD-10-CM

## 2014-09-26 LAB — CBC WITH DIFFERENTIAL/PLATELET
BASO%: 0.9 % (ref 0.0–2.0)
BASOS ABS: 0.1 10*3/uL (ref 0.0–0.1)
EOS ABS: 0.1 10*3/uL (ref 0.0–0.5)
EOS%: 0.6 % (ref 0.0–7.0)
HCT: 43.2 % (ref 34.8–46.6)
HGB: 13.9 g/dL (ref 11.6–15.9)
LYMPH%: 10.6 % — ABNORMAL LOW (ref 14.0–49.7)
MCH: 30.8 pg (ref 25.1–34.0)
MCHC: 32.1 g/dL (ref 31.5–36.0)
MCV: 96.1 fL (ref 79.5–101.0)
MONO#: 0.7 10*3/uL (ref 0.1–0.9)
MONO%: 5.6 % (ref 0.0–14.0)
NEUT%: 82.3 % — AB (ref 38.4–76.8)
NEUTROS ABS: 10.7 10*3/uL — AB (ref 1.5–6.5)
PLATELETS: 308 10*3/uL (ref 145–400)
RBC: 4.49 10*6/uL (ref 3.70–5.45)
RDW: 14.7 % — ABNORMAL HIGH (ref 11.2–14.5)
WBC: 13.1 10*3/uL — ABNORMAL HIGH (ref 3.9–10.3)
lymph#: 1.4 10*3/uL (ref 0.9–3.3)

## 2014-09-26 LAB — COMPREHENSIVE METABOLIC PANEL (CC13)
ALT: 19 U/L (ref 0–55)
ANION GAP: 15 meq/L — AB (ref 3–11)
AST: 19 U/L (ref 5–34)
Albumin: 3.9 g/dL (ref 3.5–5.0)
Alkaline Phosphatase: 68 U/L (ref 40–150)
BUN: 20.9 mg/dL (ref 7.0–26.0)
CALCIUM: 9.9 mg/dL (ref 8.4–10.4)
CHLORIDE: 105 meq/L (ref 98–109)
CO2: 22 meq/L (ref 22–29)
CREATININE: 0.8 mg/dL (ref 0.6–1.1)
EGFR: 74 mL/min/{1.73_m2} — ABNORMAL LOW (ref >90)
Glucose: 76 mg/dl (ref 70–140)
Potassium: 4 mEq/L (ref 3.5–5.1)
Sodium: 142 mEq/L (ref 136–145)
Total Bilirubin: 0.36 mg/dL (ref 0.20–1.20)
Total Protein: 7 g/dL (ref 6.4–8.3)

## 2014-09-26 NOTE — Progress Notes (Signed)
[facility] [phone]   Fax:([phone]) [place], MD [address] [place] [address]  DIAGNOSIS: Stage IIIA (T3, N2, M0) non-small cell lung cancer, squamous cell carcinoma diagnosed in September of 2015 presenting with central right lower lobe lung mass with postobstructive pneumonitis and mediastinal lymph nodes, was also a suspicious small nodule in the right upper lobe.  PRIOR THERAPY: Status post a course of concurrent chemoradiation with weekly carboplatin for AUC of 2 and paclitaxel 45 MG/M2, last dose was given 06/12/2014 with partial response.  CURRENT THERAPY: None  INTERVAL HISTORY: [name] 69 y.o. female returns to the clinic today for follow-up visit accompanied by her husband. The patient was seen by Dr. Melvyn Novas for evaluation of her persistent shortness of breath and for treatment of radiation induced pneumonitis. She was started on a taper doses of prednisone currently 30 mg by mouth daily. Her dose was decreased to 20 mg by mouth daily starting tomorrow. The patient feels better but continues to have occasional wheezing and shortness of breath with exertion. She has been off treatment since November 2015. She denied having any significant chest pain, cough or hemoptysis. The patient denied having any significant weight loss or night sweats. She has no nausea or vomiting, no fever or chills.  MEDICAL HISTORY: Past Medical History  Diagnosis Date  . Thyroid disease     hypothyroidism  . Hx of thyroid cancer   . Status post bilateral breast implants   . Consolidation lung 03/21/14    RLL PER CXR/CT  . Hypertension     just started,not on medication  . Shortness of breath     exertion  . Hypothyroidism   . Mitral valve prolapse   . Anxiety   . Arthritis   . Fibromyalgia   . Complication of anesthesia     headaches,very agitated while waking  up  . Cancer     thyroid  . Lung cancer      ALLERGIES:  is allergic to niacin and related.  MEDICATIONS:  Current Outpatient Prescriptions  Medication Sig Dispense Refill  . amLODipine (NORVASC) 5 MG tablet Take 5 mg by mouth daily.   4  . aspirin EC 81 MG tablet Take 162 mg by mouth at bedtime.     . clonazePAM (KLONOPIN) 0.5 MG tablet One three times a day with meals and 2 at bedtime 75 tablet 0  . DENTA 5000 PLUS 1.1 % CREA dental cream   3  . estradiol (ESTRACE) 0.5 MG tablet Take 0.5 mg by mouth daily.    . famotidine (PEPCID) 20 MG tablet One at bedtime 30 tablet 2  . ibuprofen (ADVIL,MOTRIN) 100 MG tablet Take 200 mg by mouth every 6 (six) hours as needed for fever.    . levothyroxine (SYNTHROID, LEVOTHROID) 125 MCG tablet Take 250 mcg by mouth daily before breakfast.     . medroxyPROGESTERone (PROVERA) 2.5 MG tablet Take 2.5 mg by mouth daily.    . nicotine (NICODERM CQ - DOSED IN MG/24 HOURS) 14 mg/24hr patch Place 14 mg onto the skin daily.    . pantoprazole (PROTONIX) 40 MG tablet Take 1 tablet (40 mg total) by mouth daily. Take 30-60 min before first meal of the day 30 tablet 2  . predniSONE (DELTASONE) 10 MG tablet Take as directed 60 tablet 0  . predniSONE (DELTASONE) 20 MG tablet Take 30 mg by mouth daily with breakfast.     .  traMADol (ULTRAM) 50 MG tablet 1-2 every 4 hours as needed for cough or pain 80 tablet 0   No current facility-administered medications for this visit.    SURGICAL HISTORY:  Past Surgical History  Procedure Laterality Date  . Thyroidectomy    . Cataract extraction Right   . Breast enhancement surgery    . Retinal detachment surgery    . Eye surgery    . Pars plana vitrectomy w/ repair of macular hole    . Back surgery      times 2  . Tonsillectomy    . Cervical cone biopsy    . Video bronchoscopy with endobronchial ultrasound N/A 04/07/2014    Procedure: VIDEO BRONCHOSCOPY WITH ENDOBRONCHIAL ULTRASOUND;  Surgeon: Melrose Nakayama, MD;  Location: [facility];  Service: Thoracic;   Laterality: N/A;    REVIEW OF SYSTEMS:  A comprehensive review of systems was negative except for: Constitutional: positive for fatigue Respiratory: positive for dyspnea on exertion and wheezing   PHYSICAL EXAMINATION: General appearance: alert, cooperative, fatigued and no distress Head: Normocephalic, without obvious abnormality, atraumatic Neck: no adenopathy, no JVD, supple, symmetrical, trachea midline and thyroid not enlarged, symmetric, no tenderness/mass/nodules Lymph nodes: Cervical, supraclavicular, and axillary nodes normal. Resp: wheezes bilaterally Back: symmetric, no curvature. ROM normal. No CVA tenderness. Cardio: regular rate and rhythm, S1, S2 normal, no murmur, click, rub or gallop GI: soft, non-tender; bowel sounds normal; no masses,  no organomegaly Extremities: extremities normal, atraumatic, no cyanosis or edema Neurologic: Alert and oriented X 3, normal strength and tone. Normal symmetric reflexes. Normal coordination and gait  ECOG PERFORMANCE STATUS: 1 - Symptomatic but completely ambulatory  Blood pressure 143/84, pulse 109, temperature 98.5 F (36.9 C), temperature source Oral, resp. rate 19, height 5\' 8"  (1.727 m), weight 158 lb 9.6 oz (71.94 kg), SpO2 99 %.  LABORATORY DATA: Lab Results  Component Value Date   WBC 13.1* 09/26/2014   HGB 13.9 09/26/2014   HCT 43.2 09/26/2014   MCV 96.1 09/26/2014   PLT 308 09/26/2014      Chemistry      Component Value Date/Time   NA 142 09/26/2014 1015   NA 138 07/28/2014 1611   K 4.0 09/26/2014 1015   K 3.9 07/28/2014 1611   CL 105 07/28/2014 1611   CO2 22 09/26/2014 1015   CO2 26 07/28/2014 1611   BUN 20.9 09/26/2014 1015   BUN 12 07/28/2014 1611   CREATININE 0.8 09/26/2014 1015   CREATININE 0.53 07/28/2014 1611      Component Value Date/Time   CALCIUM 9.9 09/26/2014 1015   CALCIUM 9.3 07/28/2014 1611   ALKPHOS 68 09/26/2014 1015   ALKPHOS 197* 04/05/2014 1336   AST 19 09/26/2014 1015   AST 18  04/05/2014 1336   ALT 19 09/26/2014 1015   ALT 33 04/05/2014 1336   BILITOT 0.36 09/26/2014 1015   BILITOT 0.3 04/05/2014 1336       RADIOGRAPHIC STUDIES: Dg Chest 2 View  08/28/2014   CLINICAL DATA:  Dyspnea, right flank pain, status post radiation therapy ending in November 2015; possible radiation pneumonitis  EXAM: CHEST  2 VIEW  COMPARISON:  PA and lateral chest x-ray of August 07, 2014 and CT scan of the chest of July 28, 2014.  FINDINGS: There has been further interval increase in interstitial density in the right upper and middle lobes. There is also increased conspicuity of density in the right paratracheal region. There is stable shift of the mediastinum towards the right.  The left lung is clear. There is no pleural effusion. The cardiac silhouette and pulmonary vascularity are normal. The observed bony thorax exhibits no acute abnormality.  IMPRESSION: Further interval increase in pulmonary parenchymal density in the right middle lobe and inferior aspect of the right upper lobe as well as in the right paratracheal region. While this could reflect post radiation change, progressive disease or superimposed pneumonia is not excluded. Chest CT scanning is recommended.   Electronically Signed   By: David  Martinique   On: 08/28/2014 13:47    ASSESSMENT AND PLAN: This is a very pleasant 69 years old white female with history of stage IIIA non-small cell lung cancer status post a course of concurrent chemoradiation with weekly carboplatin and paclitaxel with significant improvement in her disease based on the CT scan result of 07/12/2014. I discussed with the patient her treatment options including continuous observation and close monitoring with repeat his scan versus consideration of 3 cycles of consolidation chemotherapy with either carboplatin and paclitaxel or carboplatin and gemcitabine to decrease the risk of alopecia. The patient has been off treatment since November 2015. We were unable to  start her on treatment with consolidation chemotherapy secondary to persistent radiation-induced pneumonitis. She is currently on a taper dose of prednisone and feeling a little bit better. I recommended for the patient to continue her current treatment with prednisone as ordered by Dr. Melvyn Novas. I think we missed the opportunity for the consolidation chemotherapy. I will arrange for the patient to come back for follow-up visit in one month with repeat CT scan of the chest for reevaluation of her disease. She was advised to call immediately if she has any concerning symptoms in the interval. The patient voices understanding of current disease status and treatment options and is in agreement with the current care plan.  All questions were answered. The patient knows to call the clinic with any problems, questions or concerns. We can certainly see the patient much sooner if necessary.  Disclaimer: This note was dictated with voice recognition software. Similar sounding words can inadvertently be transcribed and may not be corrected upon review.

## 2014-09-26 NOTE — Progress Notes (Signed)
Quick Note:  LMTCB ______ 

## 2014-09-26 NOTE — Telephone Encounter (Signed)
lvm fo rpt regarding to March and April appt.Marland KitchenMarland KitchenMarland Kitchen

## 2014-09-27 ENCOUNTER — Telehealth: Payer: Self-pay | Admitting: *Deleted

## 2014-09-27 NOTE — Telephone Encounter (Signed)
Patient called to verify that she and her husband heard Dr. Julien Nordmann correctly yesterday.  She was unsure why she would not continue with chemotherapy at this time because she still has disease present.  She said she understood that the radiation pnuemonitis had delayed treatment, but she thought she ought to start back with chemo as soon as it improved.  Explained what close observation means and that treatment would resume if she has progressive disease.  Explained that she should allow her body and bone marrow to recover during this time and to be assured treatment will resume when appropriate.  She was reassured and appreciative.

## 2014-10-02 ENCOUNTER — Encounter: Payer: Self-pay | Admitting: *Deleted

## 2014-10-02 NOTE — Progress Notes (Signed)
Quick Note:  Letter mailed to the pt to have her call ______

## 2014-10-06 ENCOUNTER — Telehealth: Payer: Self-pay | Admitting: Internal Medicine

## 2014-10-06 NOTE — Telephone Encounter (Signed)
I spoke with patient about results and she verbalized understanding and had no questions 

## 2014-10-06 NOTE — Telephone Encounter (Signed)
Notes Recorded by Tanda Rockers, MD on 09/25/2014 at 5:29 PM Call patient : Study is about the same as it was so safe to continue taper as outlined in instructions from ov  lmtcb x1

## 2014-10-11 ENCOUNTER — Telehealth: Payer: Self-pay | Admitting: *Deleted

## 2014-10-11 NOTE — Telephone Encounter (Signed)
On 10-11-14 fax medical records to Dr. Secundino Ginger at [facility], it was consult note, sim & planning note, end of tx note, follow up note.

## 2014-10-13 ENCOUNTER — Telehealth: Payer: Self-pay | Admitting: Internal Medicine

## 2014-10-13 MED ORDER — LEVOFLOXACIN 500 MG PO TABS: 500.0000 mg | ORAL_TABLET | Freq: Every day | ORAL | Status: DC

## 2014-10-13 NOTE — Telephone Encounter (Signed)
Pt states that she has completed for Rx for Prednisone taper recently x 3 days ago ( given to patient for pneumonitis )  Pt has taken Levaquin in past, would like this refilled.   Pt c/o congestion, cough with yellow mucus and low grade fever. Requesting something be called into pharmacy.  Allergies  Allergen Reactions  . Niacin And Related Rash   Please advise Dr Melvyn Novas. Thanks.

## 2014-10-13 NOTE — Telephone Encounter (Signed)
Called and lmom to make the pt aware that the rx for the levaquin has been sent to her pharmacy.

## 2014-10-13 NOTE — Telephone Encounter (Signed)
levaquin 500 mg daily x7 days  See [place] before Good Friday if not improving

## 2014-10-16 ENCOUNTER — Telehealth: Payer: Self-pay | Admitting: Internal Medicine

## 2014-10-16 NOTE — Telephone Encounter (Signed)
10/16/14 faxed medical records to Scherrie Gerlach at [facility] [phone]

## 2014-10-24 ENCOUNTER — Other Ambulatory Visit (HOSPITAL_BASED_OUTPATIENT_CLINIC_OR_DEPARTMENT_OTHER): Payer: Medicare Other

## 2014-10-24 ENCOUNTER — Encounter (HOSPITAL_COMMUNITY): Payer: Self-pay

## 2014-10-24 ENCOUNTER — Ambulatory Visit (HOSPITAL_COMMUNITY)
Admission: RE | Admit: 2014-10-24 | Discharge: 2014-10-24 | Disposition: A | Discharge status: Home / Self Care | Payer: Medicare Other | Source: Ambulatory Visit | Attending: Internal Medicine | Admitting: Internal Medicine

## 2014-10-24 DIAGNOSIS — J90 Pleural effusion, not elsewhere classified: Secondary | ICD-10-CM | POA: Insufficient documentation

## 2014-10-24 DIAGNOSIS — C3490 Malignant neoplasm of unspecified part of unspecified bronchus or lung: Secondary | ICD-10-CM | POA: Insufficient documentation

## 2014-10-24 DIAGNOSIS — C3411 Malignant neoplasm of upper lobe, right bronchus or lung: Secondary | ICD-10-CM

## 2014-10-24 DIAGNOSIS — Z08 Encounter for follow-up examination after completed treatment for malignant neoplasm: Secondary | ICD-10-CM | POA: Diagnosis present

## 2014-10-24 DIAGNOSIS — Z923 Personal history of irradiation: Secondary | ICD-10-CM | POA: Insufficient documentation

## 2014-10-24 DIAGNOSIS — C3491 Malignant neoplasm of unspecified part of right bronchus or lung: Secondary | ICD-10-CM

## 2014-10-24 DIAGNOSIS — C3431 Malignant neoplasm of lower lobe, right bronchus or lung: Secondary | ICD-10-CM

## 2014-10-24 DIAGNOSIS — Z9221 Personal history of antineoplastic chemotherapy: Secondary | ICD-10-CM | POA: Diagnosis not present

## 2014-10-24 LAB — CBC WITH DIFFERENTIAL/PLATELET
BASO%: 0.7 % (ref 0.0–2.0)
BASOS ABS: 0 10*3/uL (ref 0.0–0.1)
EOS%: 0.8 % (ref 0.0–7.0)
Eosinophils Absolute: 0.1 10*3/uL (ref 0.0–0.5)
HCT: 38.7 % (ref 34.8–46.6)
HEMOGLOBIN: 12.7 g/dL (ref 11.6–15.9)
LYMPH#: 1.5 10*3/uL (ref 0.9–3.3)
LYMPH%: 20.7 % (ref 14.0–49.7)
MCH: 30.3 pg (ref 25.1–34.0)
MCHC: 32.9 g/dL (ref 31.5–36.0)
MCV: 92.1 fL (ref 79.5–101.0)
MONO#: 0.9 10*3/uL (ref 0.1–0.9)
MONO%: 12.4 % (ref 0.0–14.0)
NEUT#: 4.6 10*3/uL (ref 1.5–6.5)
NEUT%: 65.4 % (ref 38.4–76.8)
Platelets: 366 10*3/uL (ref 145–400)
RBC: 4.2 10*6/uL (ref 3.70–5.45)
RDW: 14.2 % (ref 11.2–14.5)
WBC: 7 10*3/uL (ref 3.9–10.3)

## 2014-10-24 LAB — COMPREHENSIVE METABOLIC PANEL (CC13)
ALK PHOS: 89 U/L (ref 40–150)
ALT: 14 U/L (ref 0–55)
AST: 16 U/L (ref 5–34)
Albumin: 3.6 g/dL (ref 3.5–5.0)
Anion Gap: 10 mEq/L (ref 3–11)
BUN: 13.9 mg/dL (ref 7.0–26.0)
CO2: 23 mEq/L (ref 22–29)
Calcium: 9.7 mg/dL (ref 8.4–10.4)
Chloride: 110 mEq/L — ABNORMAL HIGH (ref 98–109)
Creatinine: 0.7 mg/dL (ref 0.6–1.1)
EGFR: >90 mL/min/{1.73_m2} (ref >90)
Glucose: 99 mg/dl (ref 70–140)
Potassium: 4.6 mEq/L (ref 3.5–5.1)
Sodium: 143 mEq/L (ref 136–145)
Total Bilirubin: 0.41 mg/dL (ref 0.20–1.20)
Total Protein: 6.9 g/dL (ref 6.4–8.3)

## 2014-10-24 MED ORDER — IOHEXOL 300 MG/ML  SOLN
80.0000 mL | Freq: Once | INTRAMUSCULAR | Status: AC | PRN
  Administered 2014-10-24: 80 mL via INTRAVENOUS

## 2014-10-26 ENCOUNTER — Telehealth: Payer: Self-pay | Admitting: *Deleted

## 2014-10-26 NOTE — Telephone Encounter (Signed)
Scan is stable. I usually go over the results at the time of the visit unless there is urgent need to call.

## 2014-10-26 NOTE — Telephone Encounter (Signed)
Patient called and stated,"I would like my CT scan results from 3/29. Has Dr. Julien Nordmann read them? I see Dr.Mohamed next week. I want to know if I have pneumonitis or is there something else going on?" Informed patient that I would give this message to Dr. Julien Nordmann and his nurse.

## 2014-10-26 NOTE — Telephone Encounter (Signed)
Pt. Called back.  She has not heard anything from Dr. Julien Nordmann yet.  Let her know that Dr. Julien Nordmann is in clinic seeing patients and probably has not had a chance to review the scan and call her.  She understands.  If he has not called her by the morning, she will contact her pulmonologist, Dr. Melvyn Novas.

## 2014-10-26 NOTE — Telephone Encounter (Signed)
Pt notified. She is still coughing and had to use a wheelchair the other day -new for her. Is there anything that needs to be addressed.right away in light of her increasing sob. Note sent back to [facility].

## 2014-10-27 ENCOUNTER — Telehealth: Payer: Self-pay | Admitting: Internal Medicine

## 2014-10-27 NOTE — Telephone Encounter (Signed)
-----   Message from Curt Bears, MD sent at 10/26/2014  5:48 PM EDT ----- No evidence of progression but persistent radiation fibrosis. May need to reconsult with Dr. Melvyn Novas. ----- Message -----    From: Ardeen Garland, RN    Sent: 10/26/2014   5:03 PM      To: Curt Bears, MD  I told her about scan .   She is still coughing and had to use a wheelchair the other day -new for her.' Is there anything that needs to be addressed right away in light of my increasing sob"?Marland Kitchen

## 2014-10-27 NOTE — Telephone Encounter (Signed)
Spoke with pt, states she had a ct chest last Tuesday ordered by Dr. Earlie Server.  She was told that the ct was stable but states she is having breathing difficulty after tapering down prednisone.  Pt c/o increased sob with any exertion, prod cough with clear mucus worse when laying down at night.  Pt was on levaquin about 3 weeks ago for this.  Pt is requesting an asap appt with MW.  Scheduled pt for 8:45 on Monday as that is MW's only opening next week.  Nothing further needed.

## 2014-10-27 NOTE — Telephone Encounter (Signed)
I left a message regarding information below.

## 2014-10-30 ENCOUNTER — Ambulatory Visit (INDEPENDENT_AMBULATORY_CARE_PROVIDER_SITE_OTHER): Payer: Medicare Other | Admitting: Internal Medicine

## 2014-10-30 ENCOUNTER — Encounter: Payer: Self-pay | Admitting: Internal Medicine

## 2014-10-30 VITALS — BP 126/82 | HR 116 | Temp 97.7°F | Ht 68.0 in | Wt 157.0 lb

## 2014-10-30 DIAGNOSIS — R918 Other nonspecific abnormal finding of lung field: Secondary | ICD-10-CM | POA: Diagnosis not present

## 2014-10-30 MED ORDER — PREDNISONE 10 MG PO TABS: ORAL_TABLET | ORAL | Status: DC

## 2014-10-30 MED ORDER — BECLOMETHASONE DIPROPIONATE 80 MCG/ACT IN AERS: INHALATION_SPRAY | RESPIRATORY_TRACT | Status: DC

## 2014-10-30 MED ORDER — TRAMADOL HCL 50 MG PO TABS: ORAL_TABLET | ORAL | Status: DC

## 2014-10-30 NOTE — Progress Notes (Signed)
Subjective:     Patient ID: [name], female   DOB: 05/03/1946  MRN: [id number]    Brief patient profile:  63 yowf quit smoking fall 2015 with dx lung cancer 04/07/14 * s/p RT and chemo after presenting with cough with at least 50% improvement in cough p rx with last Chemo /RT end of Nov 2015 but then sob  started around xmas  Assoc with dry  coughing  5 so referred to pulmonary clinic 08/14/2014 by Dr [name] with dx of ? RT pneumonitis.   *04/07/14 fob findings Endobronchial mass lesion obstructing the right lower lobe bronchus and some mild extrinsic narrowing of the right middle lobe bronchus. Biopsies revealed non-small-cell carcinoma,       History of Present Illness  08/14/2014 1st [facility] office visit/ [name]   Chief Complaint  Patient presents with  . Pulmonary Consult    Referred by Dr. Julien Barker. Pt c/o DOE since just before Christmas 2015. She also c/o occ CP and non prod cough since DOE started. She gets walking short distances and with showering- tried pred taper and this did not help.    Initially felt to have cap rx with abx  then dx as possible RT pneumonitis and rx prednisone and continues to worsen since stopped steroids 08/07/14 Onset was insidious, pattern is progressive to point of doe x 100 ft, cough is day > noct and assoc with mild sweats but no rigors rec Prednisone 20 mg 3 daily until feel better then 2 daily x one week then one daily - take all with breakfast  Try protonix 40 (pantoprazole)   Take 30-60 min before first meal of the day and Pepcid 20 mg one bedtime until cough is completely gone for at least a week without the need for cough suppression. GERD diet   08/28/2014 f/u ov/[name] re: RT pneuomonitis/ ESR 93 now on 60 mg daily  Chief Complaint  Patient presents with  . Follow-up    CXR today. Pt states that breathing has worsened in the last 2 weeks - Increased SOB, cough, fatigue, weak, depressed and sweaty/hot. Pt reports daily activities are  harder to do like washing hair and making bed.   Better on 60 mg and worse on 40 mg with ESR before rx = 93 Cough remains dry and day > noct  rec Please remember to go to the  x-ray department downstairs for your tests - we will call you with the results when they are available. Klonopin 0.5 mg take  three times daily bfast/lunch/ supper at hs and take 2 at bedtime    09/25/2014 f/u ov/[name] re: RT fibrosis/ working dx  Risk analyst Complaint  Patient presents with  . Follow-up    Pt states that her breathing is unchanged since her last visit. She c/o feeling jittery for the past wk.   does HT ok, some difficulty with bigger stores like [employer]  Cough much better no more that a couple of ultram per day max On prednisone tapered to 30 mg per day p  Esr returned 34 on 60 mg per day  Feels klonopin not working as well as it was when first started it  REC Continue to wean off prednisone but if cough or breathing worsen go back to the previous dose that worked Please remember to go to the lab   department downstairs for your tests - we will call you with the results when they are available.    10/30/2014 f/u ov/[name] re: tapered  down to 2.5 mg and off x 3 weeks and did ok for about a week before relapse and did not resume prev dose as instructed  Chief Complaint  Patient presents with  . Acute Visit    Pt c/o increased cough and SOB for the past 2-3 wks. She states cough is esp worse at night and is occ prod with clear sputum.  She states SOB mainly just with exertion.      Benefits were at > 2.5 mg daily  Side effects occurred at > or equal 20 / all she couldn't do is run       No obvious day to day or daytime variabilty or assoc chest tightness, subjective wheeze overt sinus or hb symptoms. No unusual exp hx or h/o childhood pna/ asthma or knowledge of premature birth.  Sleeping ok without nocturnal  or early am exacerbation  of respiratory  c/o's or need for noct saba. Also denies any obvious  fluctuation of symptoms with weather or environmental changes or other aggravating or alleviating factors except as outlined above   Current Medications, Allergies, Complete Past Medical History, Past Surgical History, Family History, and Social History were reviewed in [employer] record.  ROS  The following are not active complaints unless bolded sore throat, dysphagia, dental problems, itching, sneezing,  nasal congestion or excess/ purulent secretions, ear ache,   fever, chills, sweats, unintended wt loss, pleuritic or exertional cp, hemoptysis,  orthopnea pnd or leg swelling, presyncope, palpitations, heartburn, abdominal pain, anorexia, nausea, vomiting, diarrhea  or change in bowel or urinary habits, change in stools or urine, dysuria,hematuria,  rash, arthralgias, visual complaints, headache, numbness weakness or ataxia or problems with walking or coordination,  change in mood/affect or memory.           Objective:   Physical Exam    amb wf nad  Until took a deep breath    08/28/2014          149 >  09/25/2014   155 > 10/30/2014  157  Wt Readings from Last 3 Encounters:  08/14/14 148 lb (67.132 kg)  08/07/14 146 lb 11.2 oz (66.543 kg)  08/01/14 148 lb 3.2 oz (67.223 kg)    Vital signs reviewed    HEENT: nl dentition, turbinates, and orophanx. Nl external ear canals without cough reflex   NECK :  without JVD/Nodes/TM/ nl carotid upstrokes bilaterally   LUNGS: no acc muscle use,   A few insp crackles on R on insp with cough on insp    CV:  RRR  no s3 or murmur or increase in P2, no edema   ABD:  soft and nontender with nl excursion in the supine position. No bruits or organomegaly, bowel sounds nl  MS:  warm without deformities, calf tenderness, cyanosis or clubbing  SKIN: warm and dry without lesions    NEURO:  alert, approp, no deficits      Labs  reviewed include:     Lab Results  Component Value Date   ESRSEDRATE 41* 09/25/2014    ESRSEDRATE 34* 09/04/2014   ESRSEDRATE 93* 08/14/2014               Assessment:

## 2014-10-30 NOTE — Patient Instructions (Addendum)
Take delsym two tsp every 12 hours and supplement if needed with  tramadol 50 mg up to 2 every 4 hours to suppress the urge to cough. Swallowing water or using ice chips/non mint and menthol containing candies (such as lifesavers or sugarless jolly ranchers) are also effective.  You should rest your voice and avoid activities that you know make you cough.  Once you have eliminated the cough for 3 straight days try reducing the tramadol first,  then the delsym as tolerated.    Restart prednisone at 20 mg per day until better then 10 mg daily until return  Qvar Take 2 puffs first thing in am and then another 2 puffs about 12 hours later.   Work on inhaler technique:  relax and gently blow all the way out then take a nice smooth deep breath back in, triggering the inhaler at same time you start breathing in.  Hold for up to 5 seconds if you can.  Rinse and gargle with water when done  Change follow up to 4 weeks

## 2014-10-31 ENCOUNTER — Encounter: Payer: Self-pay | Admitting: Internal Medicine

## 2014-10-31 ENCOUNTER — Encounter: Payer: Self-pay | Admitting: *Deleted

## 2014-10-31 ENCOUNTER — Ambulatory Visit (HOSPITAL_BASED_OUTPATIENT_CLINIC_OR_DEPARTMENT_OTHER): Payer: Medicare Other | Admitting: Internal Medicine

## 2014-10-31 ENCOUNTER — Telehealth: Payer: Self-pay | Admitting: Internal Medicine

## 2014-10-31 VITALS — BP 158/85 | HR 109 | Temp 97.7°F | Resp 19 | Ht 68.0 in | Wt 156.5 lb

## 2014-10-31 DIAGNOSIS — J700 Acute pulmonary manifestations due to radiation: Secondary | ICD-10-CM | POA: Insufficient documentation

## 2014-10-31 DIAGNOSIS — C3431 Malignant neoplasm of lower lobe, right bronchus or lung: Secondary | ICD-10-CM

## 2014-10-31 DIAGNOSIS — C3491 Malignant neoplasm of unspecified part of right bronchus or lung: Secondary | ICD-10-CM

## 2014-10-31 DIAGNOSIS — C3411 Malignant neoplasm of upper lobe, right bronchus or lung: Secondary | ICD-10-CM

## 2014-10-31 DIAGNOSIS — R918 Other nonspecific abnormal finding of lung field: Secondary | ICD-10-CM

## 2014-10-31 NOTE — Telephone Encounter (Signed)
Gave patient avs report and appointments for July. [facility] schedulers will call with ct - patient aware. Referral message to HIM re referral to [facility]. Patient aware she will be contacted re this appointment.

## 2014-10-31 NOTE — Progress Notes (Signed)
[name] [phone]   Fax:([phone]) [place], [place] [address] [place] [address]  DIAGNOSIS: Stage IIIA (T3, N2, M0) non-small cell lung cancer, squamous cell carcinoma diagnosed in September of 2015 presenting with central right lower lobe lung mass with postobstructive pneumonitis and mediastinal lymph nodes, was also a suspicious small nodule in the right upper lobe.  PRIOR THERAPY: Status post a course of concurrent chemoradiation with weekly carboplatin for AUC of 2 and paclitaxel 45 MG/M2, last dose was given 06/12/2014 with partial response.  CURRENT THERAPY: None.  INTERVAL HISTORY: [name] 69 y.o. female returns to the clinic today for follow-up visit accompanied by her husband. She has been observation since November 2015 was no active treatment. The patient is followed by Dr. Melvyn Novas for evaluation of her persistent shortness of breath and for treatment of radiation induced pneumonitis. She is currently on prednisone 20 mg by mouth daily. She is not happy being on prednisone because of concern about the adverse effects and she is very frustrated with her current condition. She is considering a second opinion at one of the tertiary center for evaluation of her persistent dyspnea. She continues to have wheezing and shortness of breath with exertion. She denied having any significant chest pain, cough or hemoptysis. The patient denied having any significant weight loss or night sweats. She has no nausea or vomiting, no fever or chills. She had repeat CT scan of the chest performed recently and she is here for evaluation and discussion of her scan results.  MEDICAL HISTORY: Past Medical History  Diagnosis Date  . Thyroid disease     hypothyroidism  . Hx of thyroid cancer   . Status post bilateral breast implants   . Consolidation lung 03/21/14    RLL PER CXR/CT  . Hypertension     just started,not on  medication  . Shortness of breath     exertion  . Hypothyroidism   . Mitral valve prolapse   . Anxiety   . Arthritis   . Fibromyalgia   . Complication of anesthesia     headaches,very agitated while waking  up  . Cancer     thyroid  . Lung cancer     ALLERGIES:  is allergic to niacin and related.  MEDICATIONS:  Current Outpatient Prescriptions  Medication Sig Dispense Refill  . amLODipine (NORVASC) 5 MG tablet Take 5 mg by mouth daily.   4  . aspirin EC 81 MG tablet Take 162 mg by mouth at bedtime.     . beclomethasone (QVAR) 80 MCG/ACT inhaler Take 2 puffs first thing in am and then another 2 puffs about 12 hours later.    . clonazePAM (KLONOPIN) 0.5 MG tablet One three times a day with meals and 2 at bedtime 75 tablet 0  . DENTA 5000 PLUS 1.1 % CREA dental cream   3  . estradiol (ESTRACE) 0.5 MG tablet Take 0.5 mg by mouth daily.    . famotidine (PEPCID) 20 MG tablet One at bedtime 30 tablet 2  . ibuprofen (ADVIL,MOTRIN) 100 MG tablet Take 200 mg by mouth every 6 (six) hours as needed for fever.    . levothyroxine (SYNTHROID, LEVOTHROID) 125 MCG tablet Take 250 mcg by mouth daily before breakfast.     . medroxyPROGESTERone (PROVERA) 2.5 MG tablet Take 2.5 mg by mouth daily.    . nicotine (NICODERM CQ - DOSED IN MG/24 HOURS) 14 mg/24hr  patch Place 14 mg onto the skin daily.    . pantoprazole (PROTONIX) 40 MG tablet Take 1 tablet (40 mg total) by mouth daily. Take 30-60 min before first meal of the day 30 tablet 2  . predniSONE (DELTASONE) 10 MG tablet Take as directed 60 tablet 0  . traMADol (ULTRAM) 50 MG tablet 1-2 every 4 hours as needed for cough or pain 80 tablet 0   No current facility-administered medications for this visit.    SURGICAL HISTORY:  Past Surgical History  Procedure Laterality Date  . Thyroidectomy    . Cataract extraction Right   . Breast enhancement surgery    . Retinal detachment surgery    . Eye surgery    . Pars plana vitrectomy w/ repair of  macular hole    . Back surgery      times 2  . Tonsillectomy    . Cervical cone biopsy    . Video bronchoscopy with endobronchial ultrasound N/A 04/07/2014    Procedure: VIDEO BRONCHOSCOPY WITH ENDOBRONCHIAL ULTRASOUND;  Surgeon: Melrose Nakayama, MD;  Location: [place];  Service: Thoracic;  Laterality: N/A;    REVIEW OF SYSTEMS:  A comprehensive review of systems was negative except for: Respiratory: positive for dyspnea on exertion and wheezing   PHYSICAL EXAMINATION: General appearance: alert, cooperative, fatigued and no distress Head: Normocephalic, without obvious abnormality, atraumatic Neck: no adenopathy, no JVD, supple, symmetrical, trachea midline and thyroid not enlarged, symmetric, no tenderness/mass/nodules Lymph nodes: Cervical, supraclavicular, and axillary nodes normal. Resp: wheezes bilaterally Back: symmetric, no curvature. ROM normal. No CVA tenderness. Cardio: regular rate and rhythm, S1, S2 normal, no murmur, click, rub or gallop GI: soft, non-tender; bowel sounds normal; no masses,  no organomegaly Extremities: extremities normal, atraumatic, no cyanosis or edema Neurologic: Alert and oriented X 3, normal strength and tone. Normal symmetric reflexes. Normal coordination and gait  ECOG PERFORMANCE STATUS: 1 - Symptomatic but completely ambulatory  Blood pressure 158/85, pulse 109, temperature 97.7 F (36.5 C), temperature source Oral, resp. rate 19, height 5\' 8"  (1.727 m), weight 156 lb 8 oz (70.988 kg), SpO2 97 %.  LABORATORY DATA: Lab Results  Component Value Date   WBC 7.0 10/24/2014   HGB 12.7 10/24/2014   HCT 38.7 10/24/2014   MCV 92.1 10/24/2014   PLT 366 10/24/2014      Chemistry      Component Value Date/Time   NA 143 10/24/2014 1348   NA 138 07/28/2014 1611   K 4.6 10/24/2014 1348   K 3.9 07/28/2014 1611   CL 105 07/28/2014 1611   CO2 23 10/24/2014 1348   CO2 26 07/28/2014 1611   BUN 13.9 10/24/2014 1348   BUN 12 07/28/2014 1611    CREATININE 0.7 10/24/2014 1348   CREATININE 0.53 07/28/2014 1611      Component Value Date/Time   CALCIUM 9.7 10/24/2014 1348   CALCIUM 9.3 07/28/2014 1611   ALKPHOS 89 10/24/2014 1348   ALKPHOS 197* 04/05/2014 1336   AST 16 10/24/2014 1348   AST 18 04/05/2014 1336   ALT 14 10/24/2014 1348   ALT 33 04/05/2014 1336   BILITOT 0.41 10/24/2014 1348   BILITOT 0.3 04/05/2014 1336       RADIOGRAPHIC STUDIES: Ct Chest W Contrast  10/24/2014   CLINICAL DATA:  Restaging lung cancer initially diagnosed in 2015. Chemotherapy and radiation therapy complete.  EXAM: CT CHEST WITH CONTRAST  TECHNIQUE: Multidetector CT imaging of the chest was performed during intravenous contrast administration.  CONTRAST:  20mL OMNIPAQUE IOHEXOL 300 MG/ML  SOLN  COMPARISON:  Chest CT 07/28/2014 and PET-CT 04/19/2014  FINDINGS: Chest wall: Bilateral breast prosthesis are noted. No breast masses, supraclavicular or axillary lymphadenopathy. The bony thorax is intact. No destructive bone lesions or spinal canal compromise.  Mediastinum: The heart is normal in size. No pericardial effusion. Stable tortuosity and calcification of the thoracic aorta but no dissection or focal aneurysm. Stable coronary artery calcifications. No mediastinal or hilar mass or adenopathy. There are stable radiation changes involving the right paramediastinal long and some matted soft tissue in the right hilum but no discrete measurable mass or adenopathy.  Lungs/ pleura: Extensive radiation changes involving the right paramediastinal lung. There is an enlarging right pleural effusion without definite pleural nodularity or mass. Overlying atelectasis. I do not see any obvious worrisome pulmonary lesions in the right lung or metastatic disease in the left lung.  Upper abdomen: There is a stable hypervascular lesion in the dome region of the liver with surrounding milder hypervascularity. This is probably some sort of vascular shunt or arteriovenous  malformation. Suspect a draining vein more inferiorly. No findings suspicious for upper abdominal metastatic disease. The adrenal glands are unremarkable but not completely covered.  IMPRESSION: 1. Radiation changes involving the right paramediastinal long but no definite findings for recurrent tumor or mediastinal/hilar adenopathy. 2. Small to moderate size right pleural effusion with overlying atelectasis. No definite enhancing pleural nodules are mass. 3. No findings for metastatic disease involving the left lung. 4. Stable hypervascular lesion in the right hepatic lobe, most likely some type of vascular anomaly or shunt.   Electronically Signed   By: Marijo Sanes M.D.   On: 10/24/2014 16:43    ASSESSMENT AND PLAN: This is a very pleasant 69 years old white female with history of stage IIIA non-small cell lung cancer status post a course of concurrent chemoradiation with weekly carboplatin and paclitaxel with significant improvement. She has been observation since November 2015. The recent CT scan of the chest showed no significant evidence for disease progression and no findings for recurrent tumor or mediastinal/hilar adenopathy. The scan showed the radiation changes involving the right paramediastinal region. I discussed the scan results with the patient and her husband. I recommended for her to continue on observation with repeat CT scan of the chest in 3 months. For the radiation induced pneumonitis, She will continue her treatment with prednisone for now as prescribed by Dr. Melvyn Novas. Based on her request, I would refer her to see Dr. Kerin Ransom at [facility]. She was advised to call immediately if she has any concerning symptoms in the interval. The patient voices understanding of current disease status and treatment options and is in agreement with the current care plan.  All questions were answered. The patient knows to call the clinic with any problems, questions or concerns. We  can certainly see the patient much sooner if necessary.  Disclaimer: This note was dictated with voice recognition software. Similar sounding words can inadvertently be transcribed and may not be corrected upon review.

## 2014-10-31 NOTE — Progress Notes (Signed)
Spoke with patient today at [facility].  She is doing well with some breathing issues.  She is currently seeing pulmonary and will be referred to [facility].  No other concerns noted at this time.

## 2014-11-05 ENCOUNTER — Encounter: Payer: Self-pay | Admitting: Internal Medicine

## 2014-11-05 NOTE — Assessment & Plan Note (Signed)
-  FOB 04/07/14 Endobronchial mass lesion obstructing the right lower lobe bronchus and some mild extrinsic narrowing of the right middle lobe bronchus. Biopsies revealed non-small-cell carcinoma, - chemo RT completed Nov 2015 with 50% improvement in cough  Indication for treatment: Curative  Radiation treatment dates: 05/01/2014-06/13/2014 Site/dose: Right lung, hilum and mediastinal nodes to 60 Gy at 2 Gy per fraction x 20 fractions - CT with patchy as dz only in R lung 07/28/14 - 08/14/2014  Walked RA  2 laps @ 185 ft each stopped due to  Sob no desat / nl pace  - 08/28/2014  Walked RA x 3 laps @ 185 ft each stopped due to end of study, fast pace, no desat, sob with heart racing sensation = P 144  - ESR  93    08/14/2014  > started prednisone at 60 mg daily with floor of 20 mg daily  - ESR  41     09/25/2014 on 30 mg per day with plans to taper off  Clearly has steroid resp/ dependent component as all her symptoms resolved on > 2.5 mg pred per day but had sign side effects over 20 mg per day  The goal with a chronic steroid dependent illness is always arriving at the lowest effective dose that controls the disease/symptoms and not accepting a set "formula" which is based on statistics or guidelines that don't always take into account patient  variability or the natural hx of the dz in every individual patient, which may well vary over time.  For now therefore I recommend the patient maintain  A ceiling of 20 and a floor of 10 mg daily for now.

## 2014-11-06 ENCOUNTER — Telehealth: Payer: Self-pay | Admitting: Internal Medicine

## 2014-11-06 ENCOUNTER — Ambulatory Visit: Payer: BLUE CROSS/BLUE SHIELD | Admitting: Internal Medicine

## 2014-11-06 NOTE — Telephone Encounter (Signed)
Called Dr. Adair Patter office to schedule this Second Opinion referral.  According to the scheduling department and Tommy in Dr. Adair Patter office, this patient has already been referred by other provider at [facility].  I asked Tommy if he needed the information from our office and he stated he felt they were ok.  I expressed to him that if they needed anything from our office to not hesitate to call.

## 2014-11-24 ENCOUNTER — Other Ambulatory Visit: Payer: Self-pay | Admitting: Internal Medicine

## 2014-11-27 ENCOUNTER — Ambulatory Visit: Payer: Medicare Other | Admitting: Internal Medicine

## 2015-01-10 ENCOUNTER — Telehealth: Payer: Self-pay | Admitting: Internal Medicine

## 2015-01-10 NOTE — Telephone Encounter (Signed)
PM PAL - moved 7/14 f/u to 9:15am. Left message for patient and mailed schedule.

## 2015-01-22 ENCOUNTER — Other Ambulatory Visit: Payer: Self-pay

## 2015-02-01 ENCOUNTER — Encounter (HOSPITAL_COMMUNITY): Payer: Self-pay

## 2015-02-01 ENCOUNTER — Other Ambulatory Visit (HOSPITAL_BASED_OUTPATIENT_CLINIC_OR_DEPARTMENT_OTHER): Payer: Medicare Other

## 2015-02-01 ENCOUNTER — Ambulatory Visit (HOSPITAL_COMMUNITY)
Admission: RE | Admit: 2015-02-01 | Discharge: 2015-02-01 | Disposition: A | Discharge status: Home / Self Care | Payer: Medicare Other | Source: Ambulatory Visit | Attending: Internal Medicine | Admitting: Internal Medicine

## 2015-02-01 DIAGNOSIS — C3491 Malignant neoplasm of unspecified part of right bronchus or lung: Secondary | ICD-10-CM | POA: Diagnosis present

## 2015-02-01 DIAGNOSIS — C411 Malignant neoplasm of mandible: Secondary | ICD-10-CM | POA: Diagnosis not present

## 2015-02-01 DIAGNOSIS — J700 Acute pulmonary manifestations due to radiation: Secondary | ICD-10-CM

## 2015-02-01 DIAGNOSIS — C3431 Malignant neoplasm of lower lobe, right bronchus or lung: Secondary | ICD-10-CM | POA: Diagnosis not present

## 2015-02-01 DIAGNOSIS — R918 Other nonspecific abnormal finding of lung field: Secondary | ICD-10-CM

## 2015-02-01 LAB — CBC WITH DIFFERENTIAL/PLATELET
BASO%: 1.2 % (ref 0.0–2.0)
BASOS ABS: 0.1 10*3/uL (ref 0.0–0.1)
EOS%: 2.4 % (ref 0.0–7.0)
Eosinophils Absolute: 0.1 10*3/uL (ref 0.0–0.5)
HCT: 39.5 % (ref 34.8–46.6)
HGB: 13 g/dL (ref 11.6–15.9)
LYMPH#: 1.8 10*3/uL (ref 0.9–3.3)
LYMPH%: 31.5 % (ref 14.0–49.7)
MCH: 30.1 pg (ref 25.1–34.0)
MCHC: 32.9 g/dL (ref 31.5–36.0)
MCV: 91.4 fL (ref 79.5–101.0)
MONO#: 0.8 10*3/uL (ref 0.1–0.9)
MONO%: 13.4 % (ref 0.0–14.0)
NEUT%: 51.5 % (ref 38.4–76.8)
NEUTROS ABS: 2.9 10*3/uL (ref 1.5–6.5)
Platelets: 332 10*3/uL (ref 145–400)
RBC: 4.32 10*6/uL (ref 3.70–5.45)
RDW: 14.1 % (ref 11.2–14.5)
WBC: 5.6 10*3/uL (ref 3.9–10.3)

## 2015-02-01 LAB — COMPREHENSIVE METABOLIC PANEL (CC13)
ALT: 15 U/L (ref 0–55)
ANION GAP: 10 meq/L (ref 3–11)
AST: 15 U/L (ref 5–34)
Albumin: 3.7 g/dL (ref 3.5–5.0)
Alkaline Phosphatase: 87 U/L (ref 40–150)
BILIRUBIN TOTAL: 0.29 mg/dL (ref 0.20–1.20)
BUN: 13.1 mg/dL (ref 7.0–26.0)
CO2: 23 meq/L (ref 22–29)
Calcium: 9.6 mg/dL (ref 8.4–10.4)
Chloride: 107 mEq/L (ref 98–109)
Creatinine: 0.8 mg/dL (ref 0.6–1.1)
EGFR: 78 mL/min/{1.73_m2} — AB (ref >90)
GLUCOSE: 95 mg/dL (ref 70–140)
Potassium: 4.5 mEq/L (ref 3.5–5.1)
Sodium: 140 mEq/L (ref 136–145)
Total Protein: 6.5 g/dL (ref 6.4–8.3)

## 2015-02-01 MED ORDER — IOHEXOL 300 MG/ML  SOLN
100.0000 mL | Freq: Once | INTRAMUSCULAR | Status: AC | PRN
  Administered 2015-02-01: 100 mL via INTRAVENOUS

## 2015-02-08 ENCOUNTER — Ambulatory Visit (HOSPITAL_BASED_OUTPATIENT_CLINIC_OR_DEPARTMENT_OTHER): Payer: Medicare Other | Admitting: Internal Medicine

## 2015-02-08 ENCOUNTER — Encounter: Payer: Self-pay | Admitting: Internal Medicine

## 2015-02-08 ENCOUNTER — Telehealth: Payer: Self-pay | Admitting: Internal Medicine

## 2015-02-08 VITALS — BP 157/89 | HR 100 | Temp 98.5°F | Resp 18 | Ht 68.0 in | Wt 157.6 lb

## 2015-02-08 DIAGNOSIS — J700 Acute pulmonary manifestations due to radiation: Secondary | ICD-10-CM

## 2015-02-08 DIAGNOSIS — C3431 Malignant neoplasm of lower lobe, right bronchus or lung: Secondary | ICD-10-CM

## 2015-02-08 DIAGNOSIS — C3491 Malignant neoplasm of unspecified part of right bronchus or lung: Secondary | ICD-10-CM

## 2015-02-08 NOTE — Telephone Encounter (Signed)
per pof to sch pt appt-gave pt copy pf sch-adv [facility] will call to sch scan

## 2015-02-08 NOTE — Progress Notes (Signed)
[facility] [phone]   Fax:([phone]) Arnold, MD [address] [place] [address]  DIAGNOSIS: Stage IIIA (T3, N2, M0) non-small cell lung cancer, squamous cell carcinoma diagnosed in September of 2015 presenting with central right lower lobe lung mass with postobstructive pneumonitis and mediastinal lymph nodes, was also a suspicious small nodule in the right upper lobe.  PRIOR THERAPY: Status post a course of concurrent chemoradiation with weekly carboplatin for AUC of 2 and paclitaxel 45 MG/M2, last dose was given 06/12/2014 with partial response.  CURRENT THERAPY: Observation.  INTERVAL HISTORY: [name] 69 y.o. female returns to the clinic today for follow-up visit accompanied by her husband. She has been observation since November 2015 was no active treatment. She was seen recently by Dr. Kerin Ransom at [facility] for a second opinion regarding her persistent dyspnea. She underwent bronchoscopy.  It showed severe stenosis in the right lower lobe bronchus likely related to radiation. He is considering her for stent placement. She continues to have wheezing and shortness of breath with exertion. She denied having any significant chest pain, cough or hemoptysis. The patient denied having any significant weight loss or night sweats. She has no nausea or vomiting, no fever or chills. She had repeat CT scan of the chest performed recently and she is here for evaluation and discussion of her scan results.  MEDICAL HISTORY: Past Medical History  Diagnosis Date  . Thyroid disease     hypothyroidism  . Hx of thyroid cancer   . Status post bilateral breast implants   . Consolidation lung 03/21/14    RLL PER CXR/CT  . Hypertension     just started,not on medication  . Shortness of breath     exertion  . Hypothyroidism   . Mitral valve prolapse   . Anxiety   . Arthritis   . Fibromyalgia   . Complication of  anesthesia     headaches,very agitated while waking  up  . Cancer     thyroid  . Lung cancer     2015    ALLERGIES:  is allergic to niacin and related.  MEDICATIONS:  Current Outpatient Prescriptions  Medication Sig Dispense Refill  . amLODipine (NORVASC) 5 MG tablet Take 5 mg by mouth daily.   4  . aspirin EC 81 MG tablet Take 162 mg by mouth at bedtime.     . clonazePAM (KLONOPIN) 0.5 MG tablet One three times a day with meals and 2 at bedtime 75 tablet 0  . DENTA 5000 PLUS 1.1 % CREA dental cream   3  . estradiol (ESTRACE) 0.5 MG tablet Take 0.5 mg by mouth daily.    . famotidine (PEPCID) 20 MG tablet TAKE 1 TABLET(S) BY MOUTH AT BEDTIME 30 tablet 1  . ibuprofen (ADVIL,MOTRIN) 100 MG tablet Take 200 mg by mouth every 6 (six) hours as needed for fever.    . levothyroxine (SYNTHROID, LEVOTHROID) 125 MCG tablet Take 250 mcg by mouth daily before breakfast.     . medroxyPROGESTERone (PROVERA) 2.5 MG tablet Take 2.5 mg by mouth daily.    . nicotine (NICODERM CQ - DOSED IN MG/24 HOURS) 14 mg/24hr patch Place 14 mg onto the skin daily.    . beclomethasone (QVAR) 80 MCG/ACT inhaler Take 2 puffs first thing in am and then another 2 puffs about 12 hours later. (Patient not taking: Reported on 02/08/2015)     No current facility-administered medications  for this visit.    SURGICAL HISTORY:  Past Surgical History  Procedure Laterality Date  . Thyroidectomy    . Cataract extraction Right   . Breast enhancement surgery    . Retinal detachment surgery    . Eye surgery    . Pars plana vitrectomy w/ repair of macular hole    . Back surgery      times 2  . Tonsillectomy    . Cervical cone biopsy    . Video bronchoscopy with endobronchial ultrasound N/A 04/07/2014    Procedure: VIDEO BRONCHOSCOPY WITH ENDOBRONCHIAL ULTRASOUND;  Surgeon: Melrose Nakayama, MD;  Location: [facility] [facility];  Service: Thoracic;  Laterality: N/A;    REVIEW OF SYSTEMS:  A comprehensive review of systems was negative  except for: Respiratory: positive for dyspnea on exertion and wheezing   PHYSICAL EXAMINATION: General appearance: alert, cooperative, fatigued and no distress Head: Normocephalic, without obvious abnormality, atraumatic Neck: no adenopathy, no JVD, supple, symmetrical, trachea midline and thyroid not enlarged, symmetric, no tenderness/mass/nodules Lymph nodes: Cervical, supraclavicular, and axillary nodes normal. Resp: wheezes bilaterally Back: symmetric, no curvature. ROM normal. No CVA tenderness. Cardio: regular rate and rhythm, S1, S2 normal, no murmur, click, rub or gallop GI: soft, non-tender; bowel sounds normal; no masses,  no organomegaly Extremities: extremities normal, atraumatic, no cyanosis or edema Neurologic: Alert and oriented X 3, normal strength and tone. Normal symmetric reflexes. Normal coordination and gait  ECOG PERFORMANCE STATUS: 1 - Symptomatic but completely ambulatory  Blood pressure 157/89, pulse 100, temperature 98.5 F (36.9 C), temperature source Oral, resp. rate 18, height '5\' 8"'$  (1.727 m), weight 157 lb 9.6 oz (71.487 kg), SpO2 100 %.  LABORATORY DATA: Lab Results  Component Value Date   WBC 5.6 02/01/2015   HGB 13.0 02/01/2015   HCT 39.5 02/01/2015   MCV 91.4 02/01/2015   PLT 332 02/01/2015      Chemistry      Component Value Date/Time   NA 140 02/01/2015 1051   NA 138 07/28/2014 1611   K 4.5 02/01/2015 1051   K 3.9 07/28/2014 1611   CL 105 07/28/2014 1611   CO2 23 02/01/2015 1051   CO2 26 07/28/2014 1611   BUN 13.1 02/01/2015 1051   BUN 12 07/28/2014 1611   CREATININE 0.8 02/01/2015 1051   CREATININE 0.53 07/28/2014 1611      Component Value Date/Time   CALCIUM 9.6 02/01/2015 1051   CALCIUM 9.3 07/28/2014 1611   ALKPHOS 87 02/01/2015 1051   ALKPHOS 197* 04/05/2014 1336   AST 15 02/01/2015 1051   AST 18 04/05/2014 1336   ALT 15 02/01/2015 1051   ALT 33 04/05/2014 1336   BILITOT 0.29 02/01/2015 1051   BILITOT 0.3 04/05/2014 1336        RADIOGRAPHIC STUDIES: Ct Chest W Contrast  02/01/2015   CLINICAL DATA:  Non-small-cell right lung cancer with right lung post radiation pneumonitis. History of remote thyroid cancer chemotherapy and radiation therapy completed 8 months ago. Subsequent encounter.  EXAM: CT CHEST WITH CONTRAST  TECHNIQUE: Multidetector CT imaging of the chest was performed during intravenous contrast administration.  CONTRAST:  155m OMNIPAQUE IOHEXOL 300 MG/ML  SOLN  COMPARISON:  Chest CT 10/24/2014 and 07/28/2014.  FINDINGS: Mediastinum/Nodes: There is a new right retro hilar mass with central low-density measuring 1.3 x 1.8 cm on image 30, probably a necrotic lymph node. There is mass effect on the bronchus intermedius. No other enlarged mediastinal, hilar or axillary lymph nodes are demonstrated. Previous thyroidectomy.  The heart size is normal. There is a small amount fluid in the superior pericardial recess. There is stable atherosclerosis of the aorta, great vessels and coronary arteries.  Lungs/Pleura: Small dependent right pleural effusion appears slightly smaller. There is no significant pleural fluid on the left. Evolving radiation changes in the right perihilar region with scarring and bronchiectasis. No recurrent pulmonary nodule or endobronchial lesion on identified. As above, the retro hilar mass on the right exerts mass effect on the bronchus intermedius. There is no lobar collapse. There is a stable tiny left upper lobe nodule on image number low The left lung otherwise appears clear.  Upper abdomen: There is a stable 10 mm hypervascular lesion in the right hepatic lobe with surrounding parenchymal hyper enhancement and overlying capsular retraction. This is unchanged from the recent studies. The retraction has progressed compared with older prior studies. No adrenal mass.  Musculoskeletal/Chest wall: There is no chest wall mass or suspicious osseous finding. Bilateral breast implants appear unchanged.   IMPRESSION: 1. Evolving radiation changes in the right perihilar region. 2. New right retrohilar mass is within the radiation field and demonstrates low density, suspicious for partially treated adenopathy. There is mass effect on the bronchus intermedius without lobar collapse. No other evidence of local recurrence or metastatic disease. 3. Right pleural effusion appears slightly improved. 4. Stable hypervascular liver lesion, not hypermetabolic on prior PET-CT and likely an incidental vascular lesion.   Electronically Signed   By: Richardean Sale M.D.   On: 02/01/2015 13:20    ASSESSMENT AND PLAN: This is a very pleasant 70 years old white female with history of stage IIIA non-small cell lung cancer status post a course of concurrent chemoradiation with weekly carboplatin and paclitaxel with significant improvement. She has been observation since November 2015. The recent CT scan of the chest showed no significant evidence for disease progression and no findings for recurrent tumor or mediastinal/hilar adenopathy. The scan showed evolving radiation changes in the right perihilar region as well as new right hilar mass within the radiation field was low density suspicious for partially treated adenopathy. There is a mass effect on the bronchus intermedius without lobar collapse and no evidence of local recurrence or metastatic disease. I discussed the scan results with the patient and her husband. I recommended for her to continue on observation with repeat CT scan of the chest in 3 months. For the radiation induced pneumonitis, she will continue her follow-up visit with Dr. Kerin Ransom at [facility]. She was advised to call immediately if she has any concerning symptoms in the interval. The patient voices understanding of current disease status and treatment options and is in agreement with the current care plan.  All questions were answered. The patient knows to call the clinic with any  problems, questions or concerns. We can certainly see the patient much sooner if necessary.  Disclaimer: This note was dictated with voice recognition software. Similar sounding words can inadvertently be transcribed and may not be corrected upon review.

## 2015-02-09 ENCOUNTER — Telehealth: Payer: Self-pay | Admitting: *Deleted

## 2015-02-09 NOTE — Telephone Encounter (Signed)
TC from patient regarding her recent chest CT scan. She had an appt with Dr. Julien Nordmann and he reviewed her scan results with her. Today she has some more questions as she has re-read the scan impressions and findings. Particularly the 'There is stable atherosclerosis of the aorta, great vessels and coronary arteries."  Pt states she did not know she had this issue and wondered if she needed to see a cardiologist. Advised her to check with her PCP and share this with him. Reviewed s/s of cardiac problems, MI, stroke. Reviewed diet, exercise. Patient states she will contact her pcpto see if she needs referral for cardiologist.

## 2015-02-10 NOTE — Telephone Encounter (Signed)
Unfortunately this is a routine finding for almost every patient over the age of 86. Evaluation by primary care physician is enough at this point and he can make a referral to cardiology if needed.

## 2015-02-13 NOTE — Telephone Encounter (Signed)
TC to patient regarding blood vessel/cardiac findings on recent ct scan.  No answer. Left VM to follow up with her pcp.

## 2015-03-29 ENCOUNTER — Encounter (HOSPITAL_COMMUNITY)
Admission: RE | Admit: 2015-03-29 | Discharge: 2015-03-29 | Disposition: A | Discharge status: Home / Self Care | Payer: Medicare Other | Source: Ambulatory Visit | Attending: Internal Medicine | Admitting: Internal Medicine

## 2015-03-29 VITALS — BP 110/72 | HR 88 | Ht 68.0 in | Wt 155.8 lb

## 2015-03-29 DIAGNOSIS — C3490 Malignant neoplasm of unspecified part of unspecified bronchus or lung: Secondary | ICD-10-CM | POA: Insufficient documentation

## 2015-03-29 DIAGNOSIS — J984 Other disorders of lung: Secondary | ICD-10-CM | POA: Insufficient documentation

## 2015-03-29 DIAGNOSIS — C3491 Malignant neoplasm of unspecified part of right bronchus or lung: Secondary | ICD-10-CM

## 2015-03-29 NOTE — Progress Notes (Signed)
Cardiac/Pulmonary Rehab Medication Review by a Pharmacist  Does the patient  feel that his/her medications are working for him/her?  yes  Has the patient been experiencing any side effects to the medications prescribed?  She sometimes feels jittery but her synthroid dose is being adjusted  Does the patient measure his/her own blood pressure or blood glucose at home?  no   Does the patient have any problems obtaining medications due to transportation or finances?   no  Understanding of regimen: excellent Understanding of indications: excellent Potential of compliance: excellent  Questions asked to Determine Patient Understanding of Medication Regimen:  1. What is the name of the medication?  2. What is the medication used for?  3. When should it be taken?  4. How much should be taken?  5. How will you take it?  6. What side effects should you report?  Understanding Defined as: Excellent: All questions above are correct Good: Questions 1-4 are correct Fair: Questions 1-2 are correct  Poor: 1 or none of the above questions are correct   Pharmacist comments: [name] is very knowledgeable about her medications.    [name] 03/29/2015 2:22 PM

## 2015-03-29 NOTE — Progress Notes (Signed)
Patient arrived for 1st visit/orientation/education at 1330. Patient was referred to PR by Dr. Carloyn Manner fagan due to Lung Cancer J98.4. During orientation advised patient on arrival and appointment times what to wear, what to do before, during and after exercise. Reviewed attendance and class policy. Talked about inclement weather and class consultation policy. Pt is scheduled to return Pulm Rehab on 04/03/15 at 1:30pm. Pt was advised to come to class 5 minutes before class starts. She was also given instructions on meeting with the dietician and attending the Family Structure classes. Pt is eager to get started. Patient was able to complete 6 minute walk test. Patient was measured for the equipment. Discussed equipment safety with patient. Discussed pursed lip breathing. Handouts given to demonstrate technique. Took patient pre-anthropometric measurements. Patient finished visit at 1600.

## 2015-04-03 ENCOUNTER — Encounter (HOSPITAL_COMMUNITY)
Admission: RE | Admit: 2015-04-03 | Discharge: 2015-04-03 | Disposition: A | Discharge status: Home / Self Care | Payer: Medicare Other | Source: Ambulatory Visit | Attending: Internal Medicine | Admitting: Internal Medicine

## 2015-04-03 DIAGNOSIS — C3490 Malignant neoplasm of unspecified part of unspecified bronchus or lung: Secondary | ICD-10-CM | POA: Diagnosis not present

## 2015-04-05 ENCOUNTER — Encounter (HOSPITAL_COMMUNITY)
Admission: RE | Admit: 2015-04-05 | Discharge: 2015-04-05 | Disposition: A | Discharge status: Home / Self Care | Payer: Medicare Other | Source: Ambulatory Visit | Attending: Internal Medicine | Admitting: Internal Medicine

## 2015-04-05 DIAGNOSIS — C3490 Malignant neoplasm of unspecified part of unspecified bronchus or lung: Secondary | ICD-10-CM | POA: Diagnosis not present

## 2015-04-06 NOTE — Progress Notes (Signed)
Pulmonary Rehabilitation Program Outcomes Report   Orientation:  03/29/15 Graduate Date:  tbd Discharge Date:  tbd # of sessions completed: 3  Pulmonologist: Willey Blade Family MD:  Homero Fellers Time:  1330  A.  Exercise Program:  Tolerates exercise @ 3.46 METS for 15 minutes and Walk Test Results:  Pre: 2.82 mets  B.  Mental Health:  Good mental attitude  C.  Education/Instruction/Skills  Uses Perceived Exertion Scale and/or Dyspnea Scale  Demonstrates accurate pursed lip breathing  D.  Nutrition/Weight Control/Body Composition:  Adherence to prescribed nutrition program: good    E.  Blood Lipids   No results found for: CHOL, HDL, LDLCALC, LDLDIRECT, TRIG, CHOLHDL  F.  Lifestyle Changes:  Making positive lifestyle changes and Not smoking:  Quit 03/2014  G.  Symptoms noted with exercise:  Asymptomatic  Report Completed By:  Stevphen Rochester RN   Comments:  This is the patients first week progress note for [facility].

## 2015-04-10 ENCOUNTER — Encounter (HOSPITAL_COMMUNITY): Payer: Medicare Other

## 2015-04-12 ENCOUNTER — Encounter (HOSPITAL_COMMUNITY)
Admission: RE | Admit: 2015-04-12 | Discharge: 2015-04-12 | Disposition: A | Discharge status: Home / Self Care | Payer: Medicare Other | Source: Ambulatory Visit | Attending: Internal Medicine | Admitting: Internal Medicine

## 2015-04-12 DIAGNOSIS — C3490 Malignant neoplasm of unspecified part of unspecified bronchus or lung: Secondary | ICD-10-CM | POA: Diagnosis not present

## 2015-04-17 ENCOUNTER — Encounter (HOSPITAL_COMMUNITY): Payer: Medicare Other

## 2015-04-19 ENCOUNTER — Encounter (HOSPITAL_COMMUNITY)
Admission: RE | Admit: 2015-04-19 | Discharge: 2015-04-19 | Disposition: A | Discharge status: Home / Self Care | Payer: Medicare Other | Source: Ambulatory Visit | Attending: Internal Medicine | Admitting: Internal Medicine

## 2015-04-19 DIAGNOSIS — C3490 Malignant neoplasm of unspecified part of unspecified bronchus or lung: Secondary | ICD-10-CM | POA: Diagnosis not present

## 2015-04-24 ENCOUNTER — Encounter (HOSPITAL_COMMUNITY)
Admission: RE | Admit: 2015-04-24 | Discharge: 2015-04-24 | Disposition: A | Discharge status: Home / Self Care | Payer: Medicare Other | Source: Ambulatory Visit | Attending: Internal Medicine | Admitting: Internal Medicine

## 2015-04-24 DIAGNOSIS — C3490 Malignant neoplasm of unspecified part of unspecified bronchus or lung: Secondary | ICD-10-CM | POA: Diagnosis not present

## 2015-04-26 ENCOUNTER — Encounter (HOSPITAL_COMMUNITY)
Admission: RE | Admit: 2015-04-26 | Discharge: 2015-04-26 | Disposition: A | Discharge status: Home / Self Care | Payer: Medicare Other | Source: Ambulatory Visit | Attending: Internal Medicine | Admitting: Internal Medicine

## 2015-04-26 DIAGNOSIS — C3490 Malignant neoplasm of unspecified part of unspecified bronchus or lung: Secondary | ICD-10-CM | POA: Diagnosis not present

## 2015-05-01 ENCOUNTER — Encounter (HOSPITAL_COMMUNITY)
Admission: RE | Admit: 2015-05-01 | Discharge: 2015-05-01 | Disposition: A | Discharge status: Home / Self Care | Payer: Medicare Other | Source: Ambulatory Visit | Attending: Internal Medicine | Admitting: Internal Medicine

## 2015-05-01 DIAGNOSIS — J984 Other disorders of lung: Secondary | ICD-10-CM | POA: Insufficient documentation

## 2015-05-01 DIAGNOSIS — C3490 Malignant neoplasm of unspecified part of unspecified bronchus or lung: Secondary | ICD-10-CM | POA: Insufficient documentation

## 2015-05-03 ENCOUNTER — Encounter (HOSPITAL_COMMUNITY)
Admission: RE | Admit: 2015-05-03 | Discharge: 2015-05-03 | Disposition: A | Discharge status: Home / Self Care | Payer: Medicare Other | Source: Ambulatory Visit | Attending: Internal Medicine | Admitting: Internal Medicine

## 2015-05-03 DIAGNOSIS — C3490 Malignant neoplasm of unspecified part of unspecified bronchus or lung: Secondary | ICD-10-CM | POA: Diagnosis not present

## 2015-05-03 NOTE — Progress Notes (Signed)
Patient was given individual home exercise plan. Handout was reviewed and discussed. Long term goals were reassessed. Patient verbalized an understanding.

## 2015-05-08 ENCOUNTER — Encounter (HOSPITAL_COMMUNITY): Payer: Medicare Other

## 2015-05-10 ENCOUNTER — Ambulatory Visit (HOSPITAL_COMMUNITY)
Admission: RE | Admit: 2015-05-10 | Discharge: 2015-05-10 | Disposition: A | Discharge status: Home / Self Care | Payer: Medicare Other | Source: Ambulatory Visit | Attending: Internal Medicine | Admitting: Internal Medicine

## 2015-05-10 ENCOUNTER — Encounter (HOSPITAL_COMMUNITY): Payer: Self-pay

## 2015-05-10 ENCOUNTER — Encounter (HOSPITAL_COMMUNITY): Payer: Medicare Other

## 2015-05-10 ENCOUNTER — Other Ambulatory Visit (HOSPITAL_BASED_OUTPATIENT_CLINIC_OR_DEPARTMENT_OTHER): Payer: Medicare Other

## 2015-05-10 DIAGNOSIS — C3491 Malignant neoplasm of unspecified part of right bronchus or lung: Secondary | ICD-10-CM | POA: Diagnosis present

## 2015-05-10 DIAGNOSIS — J189 Pneumonia, unspecified organism: Secondary | ICD-10-CM | POA: Diagnosis not present

## 2015-05-10 DIAGNOSIS — J700 Acute pulmonary manifestations due to radiation: Secondary | ICD-10-CM | POA: Diagnosis present

## 2015-05-10 DIAGNOSIS — C3431 Malignant neoplasm of lower lobe, right bronchus or lung: Secondary | ICD-10-CM | POA: Diagnosis not present

## 2015-05-10 DIAGNOSIS — R911 Solitary pulmonary nodule: Secondary | ICD-10-CM | POA: Insufficient documentation

## 2015-05-10 DIAGNOSIS — I2510 Atherosclerotic heart disease of native coronary artery without angina pectoris: Secondary | ICD-10-CM | POA: Diagnosis not present

## 2015-05-10 DIAGNOSIS — K769 Liver disease, unspecified: Secondary | ICD-10-CM | POA: Diagnosis not present

## 2015-05-10 LAB — COMPREHENSIVE METABOLIC PANEL (CC13)
ALT: 18 U/L (ref 0–55)
ANION GAP: 9 meq/L (ref 3–11)
AST: 19 U/L (ref 5–34)
Albumin: 3.8 g/dL (ref 3.5–5.0)
Alkaline Phosphatase: 115 U/L (ref 40–150)
BUN: 11.3 mg/dL (ref 7.0–26.0)
CHLORIDE: 110 meq/L — AB (ref 98–109)
CO2: 23 mEq/L (ref 22–29)
Calcium: 9.6 mg/dL (ref 8.4–10.4)
Creatinine: 0.7 mg/dL (ref 0.6–1.1)
EGFR: 88 mL/min/{1.73_m2} — ABNORMAL LOW (ref >90)
Glucose: 89 mg/dl (ref 70–140)
POTASSIUM: 4.4 meq/L (ref 3.5–5.1)
Sodium: 142 mEq/L (ref 136–145)
Total Bilirubin: 0.35 mg/dL (ref 0.20–1.20)
Total Protein: 6.7 g/dL (ref 6.4–8.3)

## 2015-05-10 LAB — CBC WITH DIFFERENTIAL/PLATELET
BASO%: 0.8 % (ref 0.0–2.0)
BASOS ABS: 0 10*3/uL (ref 0.0–0.1)
EOS%: 3.2 % (ref 0.0–7.0)
Eosinophils Absolute: 0.2 10*3/uL (ref 0.0–0.5)
HCT: 41.2 % (ref 34.8–46.6)
HEMOGLOBIN: 13.9 g/dL (ref 11.6–15.9)
LYMPH%: 31.9 % (ref 14.0–49.7)
MCH: 31.1 pg (ref 25.1–34.0)
MCHC: 33.7 g/dL (ref 31.5–36.0)
MCV: 92.2 fL (ref 79.5–101.0)
MONO#: 0.6 10*3/uL (ref 0.1–0.9)
MONO%: 11.8 % (ref 0.0–14.0)
NEUT#: 2.7 10*3/uL (ref 1.5–6.5)
NEUT%: 52.3 % (ref 38.4–76.8)
Platelets: 332 10*3/uL (ref 145–400)
RBC: 4.47 10*6/uL (ref 3.70–5.45)
RDW: 13.8 % (ref 11.2–14.5)
WBC: 5.2 10*3/uL (ref 3.9–10.3)
lymph#: 1.7 10*3/uL (ref 0.9–3.3)

## 2015-05-10 MED ORDER — IOHEXOL 300 MG/ML  SOLN
75.0000 mL | Freq: Once | INTRAMUSCULAR | Status: AC | PRN
  Administered 2015-05-10: 75 mL via INTRAVENOUS

## 2015-05-15 ENCOUNTER — Encounter (HOSPITAL_COMMUNITY)
Admission: RE | Admit: 2015-05-15 | Discharge: 2015-05-15 | Disposition: A | Discharge status: Home / Self Care | Payer: Medicare Other | Source: Ambulatory Visit | Attending: Internal Medicine | Admitting: Internal Medicine

## 2015-05-15 DIAGNOSIS — C3490 Malignant neoplasm of unspecified part of unspecified bronchus or lung: Secondary | ICD-10-CM | POA: Diagnosis not present

## 2015-05-17 ENCOUNTER — Encounter (HOSPITAL_COMMUNITY): Payer: Medicare Other

## 2015-05-17 ENCOUNTER — Ambulatory Visit (HOSPITAL_BASED_OUTPATIENT_CLINIC_OR_DEPARTMENT_OTHER): Payer: Medicare Other | Admitting: Internal Medicine

## 2015-05-17 ENCOUNTER — Encounter: Payer: Self-pay | Admitting: Internal Medicine

## 2015-05-17 ENCOUNTER — Telehealth: Payer: Self-pay | Admitting: Internal Medicine

## 2015-05-17 VITALS — BP 131/89 | HR 108 | Temp 98.4°F | Resp 17 | Ht 68.0 in | Wt 156.7 lb

## 2015-05-17 DIAGNOSIS — R911 Solitary pulmonary nodule: Secondary | ICD-10-CM

## 2015-05-17 DIAGNOSIS — R0609 Other forms of dyspnea: Secondary | ICD-10-CM

## 2015-05-17 DIAGNOSIS — R05 Cough: Secondary | ICD-10-CM

## 2015-05-17 DIAGNOSIS — C3431 Malignant neoplasm of lower lobe, right bronchus or lung: Secondary | ICD-10-CM

## 2015-05-17 DIAGNOSIS — C3491 Malignant neoplasm of unspecified part of right bronchus or lung: Secondary | ICD-10-CM

## 2015-05-17 DIAGNOSIS — C3411 Malignant neoplasm of upper lobe, right bronchus or lung: Secondary | ICD-10-CM

## 2015-05-17 DIAGNOSIS — R059 Cough, unspecified: Secondary | ICD-10-CM | POA: Insufficient documentation

## 2015-05-17 DIAGNOSIS — J700 Acute pulmonary manifestations due to radiation: Secondary | ICD-10-CM

## 2015-05-17 MED ORDER — HYDROCODONE-HOMATROPINE 5-1.5 MG/5ML PO SYRP: 5.0000 mL | ORAL_SOLUTION | Freq: Four times a day (QID) | ORAL | Status: DC | PRN

## 2015-05-17 NOTE — Progress Notes (Signed)
[facility] Telephone:([phone]   Fax:(336) [place], MD [address] [place] [address]  DIAGNOSIS: Stage IIIA (T3, N2, M0) non-small cell lung cancer, squamous cell carcinoma diagnosed in September of 2015 presenting with central right lower lobe lung mass with postobstructive pneumonitis and mediastinal lymph nodes, was also a suspicious small nodule in the right upper lobe.  PRIOR THERAPY: Status post a course of concurrent chemoradiation with weekly carboplatin for AUC of 2 and paclitaxel 45 MG/M2, last dose was given 06/12/2014 with partial response.  CURRENT THERAPY: Observation.  INTERVAL HISTORY: [name] 69 y.o. female returns to the clinic today for follow-up visit accompanied by her husband. She has been observation since November 2015 with no active treatment. She is currently undergoing pulmonary rehabilitation. The patient continues to have severe dry cough and requesting cough medication. She also has shortness breath with exertion She denied having any significant chest pain, or hemoptysis. The patient denied having any significant weight loss or night sweats. She has no nausea or vomiting, no fever or chills. She had repeat CT scan of the chest performed recently and she is here for evaluation and discussion of her scan results.  MEDICAL HISTORY: Past Medical History  Diagnosis Date  . Thyroid disease     hypothyroidism  . Hx of thyroid cancer   . Status post bilateral breast implants   . Consolidation lung ([facility]) 03/21/14    RLL PER CXR/CT  . Hypertension     just started,not on medication  . Shortness of breath     exertion  . Hypothyroidism   . Mitral valve prolapse   . Anxiety   . Arthritis   . Fibromyalgia   . Complication of anesthesia     headaches,very agitated while waking  up  . Cancer ([facility])     thyroid  . Lung cancer ([facility])     2015    ALLERGIES:  is allergic to niacin and  related.  MEDICATIONS:  Current Outpatient Prescriptions  Medication Sig Dispense Refill  . amLODipine (NORVASC) 5 MG tablet Take 5 mg by mouth daily.   4  . aspirin EC 81 MG tablet Take 162 mg by mouth at bedtime.     . clonazePAM (KLONOPIN) 0.5 MG tablet One three times a day with meals and 2 at bedtime (Patient taking differently: Take 0.5 mg by mouth 4 (four) times daily as needed for anxiety. One three times a day with meals and at bedtime) 75 tablet 0  . estradiol (ESTRACE) 0.5 MG tablet Take 0.5 mg by mouth daily.    Marland Kitchen ibuprofen (ADVIL,MOTRIN) 100 MG tablet Take 200 mg by mouth every 6 (six) hours as needed for fever.    . levothyroxine (SYNTHROID, LEVOTHROID) 125 MCG tablet Take 125 mcg by mouth daily before breakfast.     . medroxyPROGESTERone (PROVERA) 2.5 MG tablet Take 2.5 mg by mouth daily.     No current facility-administered medications for this visit.    SURGICAL HISTORY:  Past Surgical History  Procedure Laterality Date  . Thyroidectomy    . Cataract extraction Right   . Breast enhancement surgery    . Retinal detachment surgery    . Eye surgery    . Pars plana vitrectomy w/ repair of macular hole    . Back surgery      times 2  . Tonsillectomy    . Cervical cone biopsy    .  Video bronchoscopy with endobronchial ultrasound N/A 04/07/2014    Procedure: VIDEO BRONCHOSCOPY WITH ENDOBRONCHIAL ULTRASOUND;  Surgeon: Melrose Nakayama, MD;  Location: [facility];  Service: Thoracic;  Laterality: N/A;    REVIEW OF SYSTEMS:  Constitutional: negative Eyes: negative Ears, nose, mouth, throat, and face: negative Respiratory: positive for cough and wheezing Cardiovascular: negative Gastrointestinal: negative Genitourinary:negative Integument/breast: negative Hematologic/lymphatic: negative Musculoskeletal:negative Neurological: negative Behavioral/Psych: negative Endocrine: negative Allergic/Immunologic: negative   PHYSICAL EXAMINATION: General appearance: alert,  cooperative, fatigued and no distress Head: Normocephalic, without obvious abnormality, atraumatic Neck: no adenopathy, no JVD, supple, symmetrical, trachea midline and thyroid not enlarged, symmetric, no tenderness/mass/nodules Lymph nodes: Cervical, supraclavicular, and axillary nodes normal. Resp: wheezes bilaterally Back: symmetric, no curvature. ROM normal. No CVA tenderness. Cardio: regular rate and rhythm, S1, S2 normal, no murmur, click, rub or gallop GI: soft, non-tender; bowel sounds normal; no masses,  no organomegaly Extremities: extremities normal, atraumatic, no cyanosis or edema Neurologic: Alert and oriented X 3, normal strength and tone. Normal symmetric reflexes. Normal coordination and gait  ECOG PERFORMANCE STATUS: 1 - Symptomatic but completely ambulatory  Blood pressure 131/89, pulse 108, temperature 98.4 F (36.9 C), temperature source Oral, resp. rate 17, height '5\' 8"'$  (1.727 m), weight 156 lb 11.2 oz (71.079 kg), SpO2 96 %.  LABORATORY DATA: Lab Results  Component Value Date   WBC 5.2 05/10/2015   HGB 13.9 05/10/2015   HCT 41.2 05/10/2015   MCV 92.2 05/10/2015   PLT 332 05/10/2015      Chemistry      Component Value Date/Time   NA 142 05/10/2015 0953   NA 138 07/28/2014 1611   K 4.4 05/10/2015 0953   K 3.9 07/28/2014 1611   CL 105 07/28/2014 1611   CO2 23 05/10/2015 0953   CO2 26 07/28/2014 1611   BUN 11.3 05/10/2015 0953   BUN 12 07/28/2014 1611   CREATININE 0.7 05/10/2015 0953   CREATININE 0.53 07/28/2014 1611      Component Value Date/Time   CALCIUM 9.6 05/10/2015 0953   CALCIUM 9.3 07/28/2014 1611   ALKPHOS 115 05/10/2015 0953   ALKPHOS 197* 04/05/2014 1336   AST 19 05/10/2015 0953   AST 18 04/05/2014 1336   ALT 18 05/10/2015 0953   ALT 33 04/05/2014 1336   BILITOT 0.35 05/10/2015 0953   BILITOT 0.3 04/05/2014 1336       RADIOGRAPHIC STUDIES: Ct Chest W Contrast  05/10/2015  CLINICAL DATA:  Non-small cell right lung cancer  diagnosed in September 2015 status post completion of chemotherapy and radiation therapy in November 2015, presenting for restaging. Post radiation pneumonitis. Thyroidectomy for thyroid cancer in 1975. EXAM: CT CHEST WITH CONTRAST TECHNIQUE: Multidetector CT imaging of the chest was performed during intravenous contrast administration. CONTRAST:  79m OMNIPAQUE IOHEXOL 300 MG/ML  SOLN COMPARISON:  02/01/2015 chest CT. FINDINGS: Mediastinum/Nodes: Normal heart size. Stable mild pericardial fluid/ thickening. There is atherosclerosis of the thoracic aorta, the great vessels of the mediastinum and the coronary arteries, including calcified atherosclerotic plaque in the left anterior descending coronary artery. Great vessels are normal in course and caliber. No central pulmonary emboli. Status post thyroidectomy. Normal esophagus. No axillary, mediastinal or hilar lymphadenopathy. Lungs/Pleura: No pneumothorax. Small basilar/dependent right pleural effusion, slightly decreased. No left pleural effusion. There is stable sharply marginated consolidation with associated bronchiectasis in the central right upper and middle lobes, in keeping with stable radiation change. There is interval growth of a central right lower lobe 2.7 x 2.1 cm nodule (series  2/ image 31), previously 1.8 x 1.3 cm, which focally occludes the right mainstem bronchus. Mild curvilinear bands and patchy ground-glass opacity in the right lower lobe likely represents a combination of postobstructive pneumonitis and atelectasis. Upper abdomen: Stable 1.0 cm hypervascular lesion in segment 8 of the right liver lobe (series 2/image 40), with surrounding subsegmental liver hyper enhancement, likely a benign hypervascular lesion given the absence of liver hypermetabolism in this location on the 04/19/2014 PET-CT study. Musculoskeletal: No aggressive appearing focal osseous lesions. Intact appearing bilateral breast prostheses. Mild-to-moderate degenerative  changes in the visualized thoracolumbar spine. IMPRESSION: 1. Interval progression of 2.7 cm central right lower lobe pulmonary nodule, which focally occludes the right mainstem bronchus. No additional sites of metastatic disease in the chest. 2. Right lower lobe patchy ground-glass opacity and mild curvilinear bands, in keeping with a combination of postobstructive pneumonitis and atelectasis. 3. Stable radiation change in the central right middle and upper lobes. 4. Stable small hypervascular right liver lesion, likely benign. 5. Atherosclerosis, including 1 vessel coronary artery disease. Please note that although the presence of coronary artery calcium documents the presence of coronary artery disease, the severity of this disease and any potential stenosis cannot be assessed on this non-gated CT examination. Electronically Signed   By: Ilona Sorrel M.D.   On: 05/10/2015 12:42    ASSESSMENT AND PLAN: This is a very pleasant 69 years old white female with history of stage IIIA non-small cell lung cancer status post a course of concurrent chemoradiation with weekly carboplatin and paclitaxel with significant improvement. She has been observation since November 2015. The recent CT scan of the chest showed progression of 2.7 cm central right lower lobe pulmonary nodule with focal occlusion of the right mainstem bronchus  I discussed the scan results with the patient and her husband and showed them the images. I recommended for the patient to have a PET scan for further evaluation of her disease before consideration of further treatment. For the persistent dry cough, I gave the patient prescription for Hycodan 5 ML by mouth every 6 hours as needed. She would come back for follow-up visit in 2 weeks for evaluation and discussion of her treatment options based on the PET scan results. She was advised to call immediately if she has any concerning symptoms in the interval. The patient voices understanding of  current disease status and treatment options and is in agreement with the current care plan.  All questions were answered. The patient knows to call the clinic with any problems, questions or concerns. We can certainly see the patient much sooner if necessary.  Disclaimer: This note was dictated with voice recognition software. Similar sounding words can inadvertently be transcribed and may not be corrected upon review.

## 2015-05-17 NOTE — Telephone Encounter (Signed)
per pof to sch pt appt-gave pt copy of avs-adv pt central sch willc all to sch scan

## 2015-05-22 ENCOUNTER — Encounter (HOSPITAL_COMMUNITY): Payer: Medicare Other

## 2015-05-24 ENCOUNTER — Encounter (HOSPITAL_COMMUNITY): Payer: Medicare Other

## 2015-05-29 ENCOUNTER — Encounter (HOSPITAL_COMMUNITY): Payer: Medicare Other

## 2015-05-30 ENCOUNTER — Encounter (HOSPITAL_COMMUNITY)
Admission: RE | Admit: 2015-05-30 | Discharge: 2015-05-30 | Disposition: A | Discharge status: Home / Self Care | Payer: Medicare Other | Source: Ambulatory Visit | Attending: Internal Medicine | Admitting: Internal Medicine

## 2015-05-30 DIAGNOSIS — C3491 Malignant neoplasm of unspecified part of right bronchus or lung: Secondary | ICD-10-CM | POA: Diagnosis not present

## 2015-05-30 LAB — GLUCOSE, CAPILLARY: Glucose-Capillary: 75 mg/dL (ref 65–99)

## 2015-05-30 MED ORDER — FLUDEOXYGLUCOSE F - 18 (FDG) INJECTION
8.7000 | Freq: Once | INTRAVENOUS | Status: DC | PRN
  Administered 2015-05-30: 8.7 via INTRAVENOUS
  Filled 2015-05-30: qty 8.7

## 2015-05-31 ENCOUNTER — Encounter (HOSPITAL_COMMUNITY): Payer: Medicare Other

## 2015-06-05 ENCOUNTER — Encounter (HOSPITAL_COMMUNITY): Payer: Medicare Other

## 2015-06-07 ENCOUNTER — Ambulatory Visit (HOSPITAL_BASED_OUTPATIENT_CLINIC_OR_DEPARTMENT_OTHER): Payer: Medicare Other | Admitting: Internal Medicine

## 2015-06-07 ENCOUNTER — Encounter (HOSPITAL_COMMUNITY): Payer: Medicare Other

## 2015-06-07 ENCOUNTER — Encounter: Payer: Self-pay | Admitting: *Deleted

## 2015-06-07 ENCOUNTER — Encounter: Payer: Self-pay | Admitting: Internal Medicine

## 2015-06-07 ENCOUNTER — Telehealth: Payer: Self-pay | Admitting: Internal Medicine

## 2015-06-07 VITALS — BP 153/96 | HR 115 | Temp 98.0°F | Resp 18 | Ht 68.0 in | Wt 158.5 lb

## 2015-06-07 DIAGNOSIS — R918 Other nonspecific abnormal finding of lung field: Secondary | ICD-10-CM | POA: Diagnosis not present

## 2015-06-07 DIAGNOSIS — C771 Secondary and unspecified malignant neoplasm of intrathoracic lymph nodes: Secondary | ICD-10-CM | POA: Diagnosis not present

## 2015-06-07 DIAGNOSIS — C3491 Malignant neoplasm of unspecified part of right bronchus or lung: Secondary | ICD-10-CM

## 2015-06-07 DIAGNOSIS — C3431 Malignant neoplasm of lower lobe, right bronchus or lung: Secondary | ICD-10-CM | POA: Diagnosis not present

## 2015-06-07 DIAGNOSIS — R0602 Shortness of breath: Secondary | ICD-10-CM

## 2015-06-07 DIAGNOSIS — R05 Cough: Secondary | ICD-10-CM

## 2015-06-07 DIAGNOSIS — Z85850 Personal history of malignant neoplasm of thyroid: Secondary | ICD-10-CM

## 2015-06-07 MED ORDER — HYDROCODONE-HOMATROPINE 5-1.5 MG/5ML PO SYRP: 5.0000 mL | ORAL_SOLUTION | Freq: Four times a day (QID) | ORAL | Status: DC | PRN

## 2015-06-07 NOTE — Progress Notes (Signed)
[facility] Telephone:(336) [phone]   Fax:(336) [place], MD [address] [place] [address]  DIAGNOSIS: Progressive non-small cell lung cancer initially diagnosed as Stage IIIA (T3, N2, M0) non-small cell lung cancer, squamous cell carcinoma diagnosed in September of 2015 presenting with central right lower lobe lung mass with postobstructive pneumonitis and mediastinal lymph nodes, was also a suspicious small nodule in the right upper lobe.  PRIOR THERAPY: Status post a course of concurrent chemoradiation with weekly carboplatin for AUC of 2 and paclitaxel 45 MG/M2, last dose was given 06/12/2014 with partial response.  CURRENT THERAPY: Tecentriq (Atezolizumab) 1200 mg IV every 3 weeks. First dose 06/14/2015.  INTERVAL HISTORY: [name] 69 y.o. female returns to the clinic today for follow-up visit accompanied by her husband. The patient is feeling much better today except for the baseline shortness of breath and dry cough. Her cough responded well to treatment with Hycodan but the patient would need bigger volume of the prescription. She denied having any significant chest pain, or hemoptysis. The patient denied having any significant weight loss or night sweats. She has no nausea or vomiting, no fever or chills. She had a recent PET scan and she is here for evaluation and discussion of her scan results and treatment options.  MEDICAL HISTORY: Past Medical History  Diagnosis Date  . Thyroid disease     hypothyroidism  . Hx of thyroid cancer   . Status post bilateral breast implants   . Consolidation lung ([facility]) 03/21/14    RLL PER CXR/CT  . Hypertension     just started,not on medication  . Shortness of breath     exertion  . Hypothyroidism   . Mitral valve prolapse   . Anxiety   . Arthritis   . Fibromyalgia   . Complication of anesthesia     headaches,very agitated while waking  up  . Cancer ([facility])    thyroid  . Lung cancer ([facility])     2015    ALLERGIES:  is allergic to niacin and related.  MEDICATIONS:  Current Outpatient Prescriptions  Medication Sig Dispense Refill  . amLODipine (NORVASC) 5 MG tablet Take 5 mg by mouth daily.   4  . aspirin EC 81 MG tablet Take 162 mg by mouth at bedtime.     Marland Kitchen estradiol (ESTRACE) 0.5 MG tablet Take 0.5 mg by mouth daily.    Marland Kitchen HYDROcodone-homatropine (HYCODAN) 5-1.5 MG/5ML syrup Take 5 mLs by mouth every 6 (six) hours as needed for cough. 120 mL 0  . ibuprofen (ADVIL,MOTRIN) 100 MG tablet Take 200 mg by mouth every 6 (six) hours as needed for fever.    . levothyroxine (SYNTHROID, LEVOTHROID) 125 MCG tablet Take 137 mcg by mouth daily before breakfast.     . medroxyPROGESTERone (PROVERA) 2.5 MG tablet Take 2.5 mg by mouth daily.    . clonazePAM (KLONOPIN) 0.5 MG tablet One three times a day with meals and 2 at bedtime (Patient taking differently: Take 0.5 mg by mouth as needed for anxiety. ) 75 tablet 0   No current facility-administered medications for this visit.    SURGICAL HISTORY:  Past Surgical History  Procedure Laterality Date  . Thyroidectomy    . Cataract extraction Right   . Breast enhancement surgery    . Retinal detachment surgery    . Eye surgery    . Pars plana vitrectomy w/ repair of macular hole    .  Back surgery      times 2  . Tonsillectomy    . Cervical cone biopsy    . Video bronchoscopy with endobronchial ultrasound N/A 04/07/2014    Procedure: VIDEO BRONCHOSCOPY WITH ENDOBRONCHIAL ULTRASOUND;  Surgeon: Melrose Nakayama, MD;  Location: [facility];  Service: Thoracic;  Laterality: N/A;    REVIEW OF SYSTEMS:  Constitutional: negative Eyes: negative Ears, nose, mouth, throat, and face: negative Respiratory: positive for cough and wheezing Cardiovascular: negative Gastrointestinal: negative Genitourinary:negative Integument/breast: negative Hematologic/lymphatic: negative Musculoskeletal:negative Neurological:  negative Behavioral/Psych: negative Endocrine: negative Allergic/Immunologic: negative   PHYSICAL EXAMINATION: General appearance: alert, cooperative, fatigued and no distress Head: Normocephalic, without obvious abnormality, atraumatic Neck: no adenopathy, no JVD, supple, symmetrical, trachea midline and thyroid not enlarged, symmetric, no tenderness/mass/nodules Lymph nodes: Cervical, supraclavicular, and axillary nodes normal. Resp: wheezes bilaterally Back: symmetric, no curvature. ROM normal. No CVA tenderness. Cardio: regular rate and rhythm, S1, S2 normal, no murmur, click, rub or gallop GI: soft, non-tender; bowel sounds normal; no masses,  no organomegaly Extremities: extremities normal, atraumatic, no cyanosis or edema Neurologic: Alert and oriented X 3, normal strength and tone. Normal symmetric reflexes. Normal coordination and gait  ECOG PERFORMANCE STATUS: 1 - Symptomatic but completely ambulatory  Blood pressure 153/96, pulse 115, temperature 98 F (36.7 C), temperature source Oral, resp. rate 18, height '5\' 8"'$  (1.727 m), weight 158 lb 8 oz (71.895 kg), SpO2 100 %.  LABORATORY DATA: Lab Results  Component Value Date   WBC 5.2 05/10/2015   HGB 13.9 05/10/2015   HCT 41.2 05/10/2015   MCV 92.2 05/10/2015   PLT 332 05/10/2015      Chemistry      Component Value Date/Time   NA 142 05/10/2015 0953   NA 138 07/28/2014 1611   K 4.4 05/10/2015 0953   K 3.9 07/28/2014 1611   CL 105 07/28/2014 1611   CO2 23 05/10/2015 0953   CO2 26 07/28/2014 1611   BUN 11.3 05/10/2015 0953   BUN 12 07/28/2014 1611   CREATININE 0.7 05/10/2015 0953   CREATININE 0.53 07/28/2014 1611      Component Value Date/Time   CALCIUM 9.6 05/10/2015 0953   CALCIUM 9.3 07/28/2014 1611   ALKPHOS 115 05/10/2015 0953   ALKPHOS 197* 04/05/2014 1336   AST 19 05/10/2015 0953   AST 18 04/05/2014 1336   ALT 18 05/10/2015 0953   ALT 33 04/05/2014 1336   BILITOT 0.35 05/10/2015 0953   BILITOT 0.3  04/05/2014 1336       RADIOGRAPHIC STUDIES: Ct Chest W Contrast  05/10/2015  CLINICAL DATA:  Non-small cell right lung cancer diagnosed in September 2015 status post completion of chemotherapy and radiation therapy in November 2015, presenting for restaging. Post radiation pneumonitis. Thyroidectomy for thyroid cancer in 1975. EXAM: CT CHEST WITH CONTRAST TECHNIQUE: Multidetector CT imaging of the chest was performed during intravenous contrast administration. CONTRAST:  27m OMNIPAQUE IOHEXOL 300 MG/ML  SOLN COMPARISON:  02/01/2015 chest CT. FINDINGS: Mediastinum/Nodes: Normal heart size. Stable mild pericardial fluid/ thickening. There is atherosclerosis of the thoracic aorta, the great vessels of the mediastinum and the coronary arteries, including calcified atherosclerotic plaque in the left anterior descending coronary artery. Great vessels are normal in course and caliber. No central pulmonary emboli. Status post thyroidectomy. Normal esophagus. No axillary, mediastinal or hilar lymphadenopathy. Lungs/Pleura: No pneumothorax. Small basilar/dependent right pleural effusion, slightly decreased. No left pleural effusion. There is stable sharply marginated consolidation with associated bronchiectasis in the central right upper and middle  lobes, in keeping with stable radiation change. There is interval growth of a central right lower lobe 2.7 x 2.1 cm nodule (series 2/ image 31), previously 1.8 x 1.3 cm, which focally occludes the right mainstem bronchus. Mild curvilinear bands and patchy ground-glass opacity in the right lower lobe likely represents a combination of postobstructive pneumonitis and atelectasis. Upper abdomen: Stable 1.0 cm hypervascular lesion in segment 8 of the right liver lobe (series 2/image 40), with surrounding subsegmental liver hyper enhancement, likely a benign hypervascular lesion given the absence of liver hypermetabolism in this location on the 04/19/2014 PET-CT study.  Musculoskeletal: No aggressive appearing focal osseous lesions. Intact appearing bilateral breast prostheses. Mild-to-moderate degenerative changes in the visualized thoracolumbar spine. IMPRESSION: 1. Interval progression of 2.7 cm central right lower lobe pulmonary nodule, which focally occludes the right mainstem bronchus. No additional sites of metastatic disease in the chest. 2. Right lower lobe patchy ground-glass opacity and mild curvilinear bands, in keeping with a combination of postobstructive pneumonitis and atelectasis. 3. Stable radiation change in the central right middle and upper lobes. 4. Stable small hypervascular right liver lesion, likely benign. 5. Atherosclerosis, including 1 vessel coronary artery disease. Please note that although the presence of coronary artery calcium documents the presence of coronary artery disease, the severity of this disease and any potential stenosis cannot be assessed on this non-gated CT examination. Electronically Signed   By: Ilona Sorrel M.D.   On: 05/10/2015 12:42   Nm Pet Image Restag (ps) Skull Base To Thigh  05/30/2015  CLINICAL DATA:  Subsequent treatment strategy for restaging of lung cancer. Non-small-cell type, right lung. EXAM: NUCLEAR MEDICINE PET SKULL BASE TO THIGH TECHNIQUE: 8.7 mCi F-18 FDG was injected intravenously. Full-ring PET imaging was performed from the skull base to thigh after the radiotracer. CT data was obtained and used for attenuation correction and anatomic localization. FASTING BLOOD GLUCOSE:  Value: 75 mg/dl COMPARISON:  05/10/2015 chest CT.  Most recent PET of 04/19/2014. FINDINGS: NECK No areas of abnormal hypermetabolism. CHEST Hypermetabolism corresponding to a small subcarinal node. This measures a S.U.V. max of 10.3 and on the order of 9 mm (image 66, series 4). Hypermetabolism which corresponds to the right lower lobe pulmonary nodule. This measures 2.1 cm and a S.U.V. max of 24.7 (image 33, series 6). Small right pleural  effusion or thickening, demonstrating nonspecific low-level hypermetabolism. Presumed radiation fibrosis within the medial right upper lobe. This demonstrates low-level hypermetabolism, measuring a S.U.V. max of 4.3 (image 24, series 6). ABDOMEN/PELVIS No areas of abnormal hypermetabolism. SKELETON Right facet hypermetabolism corresponds to arthropathy at the C7-T1 level. This measures a S.U.V. max of 4.3 (image 38, series 4). Similarly, left-sided L3-4 facet hypermetabolism corresponds to arthropathy (image 124, series 4). CT IMAGES PERFORMED FOR ATTENUATION CORRECTION Right globe surgery or prosthesis. No cervical adenopathy. Carotid atherosclerosis. Heart size accentuated by a pectus excavatum deformity. LAD coronary artery atherosclerosis. Bilateral breast implants. Other chest findings deferred to recent diagnostic CT. Hypoattenuation within the high right lobe of the liver (image 92, series 4). This corresponds to hyper enhancement on the prior diagnostic CT and likely represents a hemangioma. Not significantly hypermetabolic. Abdominal aortic and branch vessel atherosclerosis. Pelvic floor laxity. Convex right lumbar spine curvature. IMPRESSION: 1. Residual hypermetabolic right lower lobe pulmonary nodule with subcarinal nodal metastasis. 2. Foci of hypermetabolism involving right cervical thoracic junction and left lumbar facets, favored to be degenerative. 3. Trace right pleural fluid or thickening with low-level nonspecific hypermetabolism. 4. No evidence of extrathoracic  metastasis. 5.  Atherosclerosis, including within the coronary arteries. Electronically Signed   By: Abigail Miyamoto M.D.   On: 05/30/2015 16:15    ASSESSMENT AND PLAN: This is a very pleasant 69 years old white female with history of stage IIIA non-small cell lung cancer status post a course of concurrent chemoradiation with weekly carboplatin and paclitaxel with significant improvement. She has been observation since November  2015. The recent CT scan of the chest showed progression of 2.7 cm central right lower lobe pulmonary nodule with focal occlusion of the right mainstem bronchus. She had a PET scan performed recently that showed residual hypermetabolic right lower lobe pulmonary nodule with subcarinal nodal metastasis.  I discussed the PET scan results with the patient and her husband. I also discussed with the patient several options for treatment of her condition including systemic chemotherapy versus treatment with immunotherapy and the patient was giving several options for the treatment with immunotherapy including Ketruda, Opdivo or Tecentriq [employer]). I discussed with the patient these options in details. The patient comes from [facility] and she would like to proceed with treatment with Tecentriq [employer]ienen[facility]) every 3 weeks. She is expected to start the first cycle of this treatment next week. I discussed with the patient adverse effect of the immunotherapy including but not limited to immune mediated pneumonitis, diarrhea, liver, renal, thyroid or other endocrine dysfunction. For the persistent dry cough, I gave the patient prescription for Hycodan 5 ML by mouth every 6 hours as needed. She will come back for follow-up visit in 4 weeks for reevaluation before starting cycle #2. She was advised to call immediately if she has any concerning symptoms in the interval. The patient voices understanding of current disease status and treatment options and is in agreement with the current care plan.  All questions were answered. The patient knows to call the clinic with any problems, questions or concerns. We can certainly see the patient much sooner if necessary.  Disclaimer: This note was dictated with voice recognition software. Similar sounding words can inadvertently be transcribed and may not be corrected upon review.

## 2015-06-07 NOTE — Telephone Encounter (Signed)
Gave patient avs report and appointments for November and December  °

## 2015-06-07 NOTE — Progress Notes (Signed)
Oncology Nurse Navigator Documentation  Oncology Nurse Navigator Flowsheets 06/07/2015  Navigator Encounter Type Clinic/MDC/spoke with patient and husband today at [facility].  Patient is having treatment change to Tecentriq.  I gave her information about medication and side effects.  She was thankful for the help.   Patient Visit Type Follow-up  Treatment Phase Treatment  Barriers/Navigation Needs Education  Education Other  Interventions Education Method  Education Method Written;Verbal  Time Spent with Patient 30

## 2015-06-12 ENCOUNTER — Encounter (HOSPITAL_COMMUNITY)
Admission: RE | Admit: 2015-06-12 | Discharge: 2015-06-12 | Disposition: A | Discharge status: Home / Self Care | Payer: Medicare Other | Source: Ambulatory Visit | Attending: Internal Medicine | Admitting: Internal Medicine

## 2015-06-12 DIAGNOSIS — C3490 Malignant neoplasm of unspecified part of unspecified bronchus or lung: Secondary | ICD-10-CM | POA: Diagnosis present

## 2015-06-12 DIAGNOSIS — J984 Other disorders of lung: Secondary | ICD-10-CM | POA: Diagnosis not present

## 2015-06-14 ENCOUNTER — Encounter (HOSPITAL_COMMUNITY): Payer: Medicare Other

## 2015-06-14 ENCOUNTER — Other Ambulatory Visit (HOSPITAL_BASED_OUTPATIENT_CLINIC_OR_DEPARTMENT_OTHER): Payer: Medicare Other

## 2015-06-14 ENCOUNTER — Ambulatory Visit (HOSPITAL_BASED_OUTPATIENT_CLINIC_OR_DEPARTMENT_OTHER): Payer: Medicare Other

## 2015-06-14 VITALS — BP 119/86 | HR 108 | Temp 97.7°F | Resp 18

## 2015-06-14 DIAGNOSIS — C3431 Malignant neoplasm of lower lobe, right bronchus or lung: Secondary | ICD-10-CM | POA: Diagnosis not present

## 2015-06-14 DIAGNOSIS — C771 Secondary and unspecified malignant neoplasm of intrathoracic lymph nodes: Secondary | ICD-10-CM

## 2015-06-14 DIAGNOSIS — Z5112 Encounter for antineoplastic immunotherapy: Secondary | ICD-10-CM | POA: Diagnosis not present

## 2015-06-14 DIAGNOSIS — Z79899 Other long term (current) drug therapy: Secondary | ICD-10-CM

## 2015-06-14 DIAGNOSIS — C3491 Malignant neoplasm of unspecified part of right bronchus or lung: Secondary | ICD-10-CM

## 2015-06-14 DIAGNOSIS — Z85850 Personal history of malignant neoplasm of thyroid: Secondary | ICD-10-CM

## 2015-06-14 LAB — COMPREHENSIVE METABOLIC PANEL (CC13)
ALT: 13 U/L (ref 0–55)
AST: 18 U/L (ref 5–34)
Albumin: 3.9 g/dL (ref 3.5–5.0)
Alkaline Phosphatase: 98 U/L (ref 40–150)
Anion Gap: 11 mEq/L (ref 3–11)
BILIRUBIN TOTAL: 0.51 mg/dL (ref 0.20–1.20)
BUN: 17 mg/dL (ref 7.0–26.0)
CHLORIDE: 109 meq/L (ref 98–109)
CO2: 20 mEq/L — ABNORMAL LOW (ref 22–29)
Calcium: 9.9 mg/dL (ref 8.4–10.4)
Creatinine: 0.8 mg/dL (ref 0.6–1.1)
EGFR: 76 mL/min/{1.73_m2} — ABNORMAL LOW (ref >90)
GLUCOSE: 93 mg/dL (ref 70–140)
POTASSIUM: 4 meq/L (ref 3.5–5.1)
SODIUM: 140 meq/L (ref 136–145)
Total Protein: 7 g/dL (ref 6.4–8.3)

## 2015-06-14 LAB — CBC WITH DIFFERENTIAL/PLATELET
BASO%: 1.4 % (ref 0.0–2.0)
BASOS ABS: 0.1 10*3/uL (ref 0.0–0.1)
EOS ABS: 0.1 10*3/uL (ref 0.0–0.5)
EOS%: 1.9 % (ref 0.0–7.0)
HCT: 40.1 % (ref 34.8–46.6)
HEMOGLOBIN: 13.3 g/dL (ref 11.6–15.9)
LYMPH%: 31.5 % (ref 14.0–49.7)
MCH: 30.6 pg (ref 25.1–34.0)
MCHC: 33.2 g/dL (ref 31.5–36.0)
MCV: 91.9 fL (ref 79.5–101.0)
MONO#: 0.7 10*3/uL (ref 0.1–0.9)
MONO%: 10.9 % (ref 0.0–14.0)
NEUT#: 3.3 10*3/uL (ref 1.5–6.5)
NEUT%: 54.3 % (ref 38.4–76.8)
Platelets: 333 10*3/uL (ref 145–400)
RBC: 4.36 10*6/uL (ref 3.70–5.45)
RDW: 14.4 % (ref 11.2–14.5)
WBC: 6.1 10*3/uL (ref 3.9–10.3)
lymph#: 1.9 10*3/uL (ref 0.9–3.3)

## 2015-06-14 LAB — TSH CHCC: TSH: 0.991 m(IU)/L (ref 0.308–3.960)

## 2015-06-14 MED ORDER — SODIUM CHLORIDE 0.9 % IV SOLN
1200.0000 mg | Freq: Once | INTRAVENOUS | Status: AC
  Administered 2015-06-14: 1200 mg via INTRAVENOUS
  Filled 2015-06-14: qty 20

## 2015-06-14 MED ORDER — SODIUM CHLORIDE 0.9 % IV SOLN
Freq: Once | INTRAVENOUS | Status: AC
  Administered 2015-06-14: 10:00:00 via INTRAVENOUS

## 2015-06-14 NOTE — Progress Notes (Signed)
Dr. Julien Nordmann made aware of patient's HR prior to treatment and he stated it was ok to treat.

## 2015-06-14 NOTE — Patient Instructions (Signed)
  [facility] Discharge Instructions for Patients Receiving Chemotherapy  Today you received the following chemotherapy agents Tecentriq.  To help prevent nausea and vomiting after your treatment, we encourage you to take your nausea medication as directed.  If you develop nausea and vomiting that is not controlled by your nausea medication, call the clinic.   BELOW ARE SYMPTOMS THAT SHOULD BE REPORTED IMMEDIATELY:  *FEVER GREATER THAN 100.5 F  *CHILLS WITH OR WITHOUT FEVER  NAUSEA AND VOMITING THAT IS NOT CONTROLLED WITH YOUR NAUSEA MEDICATION  *UNUSUAL SHORTNESS OF BREATH  *UNUSUAL BRUISING OR BLEEDING  TENDERNESS IN MOUTH AND THROAT WITH OR WITHOUT PRESENCE OF ULCERS  *URINARY PROBLEMS  *BOWEL PROBLEMS  UNUSUAL RASH Items with * indicate a potential emergency and should be followed up as soon as possible.  Feel free to call the clinic you have any questions or concerns. The clinic phone number is [phone].  Please show the [facility] at check-in to the Emergency Department and triage nurse.

## 2015-06-18 ENCOUNTER — Telehealth: Payer: Self-pay | Admitting: *Deleted

## 2015-06-18 ENCOUNTER — Other Ambulatory Visit: Payer: Self-pay | Admitting: Internal Medicine

## 2015-06-18 NOTE — Telephone Encounter (Signed)
-----   Message from Herschell Dimes, RN sent at 06/14/2015  1:59 PM EST ----- Regarding: first time Tecentriq follow up call; MD Carollee Herter: [phone] Received first Tecentriq treatment today; tolerated well.  MD Julien Nordmann

## 2015-06-18 NOTE — Telephone Encounter (Signed)
Called pt unable to reach. LMOVM asking pt to call office with any concerns or questions regarding treatment.

## 2015-06-19 ENCOUNTER — Encounter (HOSPITAL_COMMUNITY): Payer: Medicare Other

## 2015-06-21 ENCOUNTER — Encounter (HOSPITAL_COMMUNITY): Payer: Medicare Other

## 2015-06-26 ENCOUNTER — Encounter (HOSPITAL_COMMUNITY)
Admission: RE | Admit: 2015-06-26 | Discharge: 2015-06-26 | Disposition: A | Discharge status: Home / Self Care | Payer: Medicare Other | Source: Ambulatory Visit | Attending: Internal Medicine | Admitting: Internal Medicine

## 2015-06-26 DIAGNOSIS — C3490 Malignant neoplasm of unspecified part of unspecified bronchus or lung: Secondary | ICD-10-CM | POA: Diagnosis not present

## 2015-06-28 ENCOUNTER — Encounter (HOSPITAL_COMMUNITY): Payer: Medicare Other

## 2015-07-03 ENCOUNTER — Encounter (HOSPITAL_COMMUNITY)
Admission: RE | Admit: 2015-07-03 | Discharge: 2015-07-03 | Disposition: A | Discharge status: Home / Self Care | Payer: Medicare Other | Source: Ambulatory Visit | Attending: Internal Medicine | Admitting: Internal Medicine

## 2015-07-03 DIAGNOSIS — C3490 Malignant neoplasm of unspecified part of unspecified bronchus or lung: Secondary | ICD-10-CM | POA: Diagnosis present

## 2015-07-03 DIAGNOSIS — J984 Other disorders of lung: Secondary | ICD-10-CM | POA: Diagnosis not present

## 2015-07-04 ENCOUNTER — Other Ambulatory Visit: Payer: Self-pay | Admitting: Medical Oncology

## 2015-07-04 DIAGNOSIS — C3491 Malignant neoplasm of unspecified part of right bronchus or lung: Secondary | ICD-10-CM

## 2015-07-05 ENCOUNTER — Encounter (HOSPITAL_COMMUNITY): Payer: Medicare Other

## 2015-07-05 ENCOUNTER — Ambulatory Visit (HOSPITAL_BASED_OUTPATIENT_CLINIC_OR_DEPARTMENT_OTHER): Payer: Medicare Other | Admitting: Internal Medicine

## 2015-07-05 ENCOUNTER — Ambulatory Visit (HOSPITAL_BASED_OUTPATIENT_CLINIC_OR_DEPARTMENT_OTHER): Payer: Medicare Other

## 2015-07-05 ENCOUNTER — Encounter: Payer: Self-pay | Admitting: *Deleted

## 2015-07-05 ENCOUNTER — Encounter: Payer: Self-pay | Admitting: Internal Medicine

## 2015-07-05 ENCOUNTER — Other Ambulatory Visit (HOSPITAL_BASED_OUTPATIENT_CLINIC_OR_DEPARTMENT_OTHER): Payer: Medicare Other

## 2015-07-05 VITALS — BP 148/81 | HR 111 | Temp 98.3°F | Resp 18 | Ht 68.0 in | Wt 156.8 lb

## 2015-07-05 DIAGNOSIS — C3431 Malignant neoplasm of lower lobe, right bronchus or lung: Secondary | ICD-10-CM

## 2015-07-05 DIAGNOSIS — R0602 Shortness of breath: Secondary | ICD-10-CM | POA: Diagnosis not present

## 2015-07-05 DIAGNOSIS — E0590 Thyrotoxicosis, unspecified without thyrotoxic crisis or storm: Secondary | ICD-10-CM

## 2015-07-05 DIAGNOSIS — C3491 Malignant neoplasm of unspecified part of right bronchus or lung: Secondary | ICD-10-CM

## 2015-07-05 DIAGNOSIS — Z5112 Encounter for antineoplastic immunotherapy: Secondary | ICD-10-CM | POA: Diagnosis not present

## 2015-07-05 DIAGNOSIS — C771 Secondary and unspecified malignant neoplasm of intrathoracic lymph nodes: Secondary | ICD-10-CM

## 2015-07-05 DIAGNOSIS — Z79899 Other long term (current) drug therapy: Secondary | ICD-10-CM

## 2015-07-05 DIAGNOSIS — R05 Cough: Secondary | ICD-10-CM | POA: Diagnosis not present

## 2015-07-05 DIAGNOSIS — Z85850 Personal history of malignant neoplasm of thyroid: Secondary | ICD-10-CM

## 2015-07-05 HISTORY — DX: Encounter for antineoplastic immunotherapy: Z51.12

## 2015-07-05 LAB — COMPREHENSIVE METABOLIC PANEL
ALT: 9 U/L (ref 0–55)
ANION GAP: 13 meq/L — AB (ref 3–11)
AST: 17 U/L (ref 5–34)
Albumin: 4 g/dL (ref 3.5–5.0)
Alkaline Phosphatase: 101 U/L (ref 40–150)
BUN: 11.7 mg/dL (ref 7.0–26.0)
CHLORIDE: 109 meq/L (ref 98–109)
CO2: 18 meq/L — AB (ref 22–29)
CREATININE: 0.8 mg/dL (ref 0.6–1.1)
Calcium: 9.6 mg/dL (ref 8.4–10.4)
EGFR: 75 mL/min/{1.73_m2} — ABNORMAL LOW (ref >90)
Glucose: 93 mg/dl (ref 70–140)
POTASSIUM: 4.1 meq/L (ref 3.5–5.1)
Sodium: 140 mEq/L (ref 136–145)
Total Bilirubin: 0.36 mg/dL (ref 0.20–1.20)
Total Protein: 7.2 g/dL (ref 6.4–8.3)

## 2015-07-05 LAB — CBC WITH DIFFERENTIAL/PLATELET
BASO%: 0.9 % (ref 0.0–2.0)
BASOS ABS: 0.1 10*3/uL (ref 0.0–0.1)
EOS%: 1.7 % (ref 0.0–7.0)
Eosinophils Absolute: 0.1 10*3/uL (ref 0.0–0.5)
HCT: 41.8 % (ref 34.8–46.6)
HGB: 14 g/dL (ref 11.6–15.9)
LYMPH%: 22.6 % (ref 14.0–49.7)
MCH: 30.9 pg (ref 25.1–34.0)
MCHC: 33.6 g/dL (ref 31.5–36.0)
MCV: 91.9 fL (ref 79.5–101.0)
MONO#: 0.7 10*3/uL (ref 0.1–0.9)
MONO%: 9.5 % (ref 0.0–14.0)
NEUT#: 4.8 10*3/uL (ref 1.5–6.5)
NEUT%: 65.3 % (ref 38.4–76.8)
PLATELETS: 307 10*3/uL (ref 145–400)
RBC: 4.55 10*6/uL (ref 3.70–5.45)
RDW: 13.6 % (ref 11.2–14.5)
WBC: 7.3 10*3/uL (ref 3.9–10.3)
lymph#: 1.6 10*3/uL (ref 0.9–3.3)

## 2015-07-05 LAB — TSH: TSH: 10.235 m[IU]/L — AB (ref 0.308–3.960)

## 2015-07-05 MED ORDER — SODIUM CHLORIDE 0.9 % IV SOLN
Freq: Once | INTRAVENOUS | Status: AC
  Administered 2015-07-05: 11:00:00 via INTRAVENOUS

## 2015-07-05 MED ORDER — SODIUM CHLORIDE 0.9 % IV SOLN
1200.0000 mg | Freq: Once | INTRAVENOUS | Status: AC
  Administered 2015-07-05: 1200 mg via INTRAVENOUS
  Filled 2015-07-05: qty 20

## 2015-07-05 NOTE — Patient Instructions (Signed)
[facility] Discharge Instructions for Patients Receiving Chemotherapy  Today you received the following chemotherapy agents Tecentriq To help prevent nausea and vomiting after your treatment, we encourage you to take your nausea medication as prescribed.   If you develop nausea and vomiting that is not controlled by your nausea medication, call the clinic.   BELOW ARE SYMPTOMS THAT SHOULD BE REPORTED IMMEDIATELY:  *FEVER GREATER THAN 100.5 F  *CHILLS WITH OR WITHOUT FEVER  NAUSEA AND VOMITING THAT IS NOT CONTROLLED WITH YOUR NAUSEA MEDICATION  *UNUSUAL SHORTNESS OF BREATH  *UNUSUAL BRUISING OR BLEEDING  TENDERNESS IN MOUTH AND THROAT WITH OR WITHOUT PRESENCE OF ULCERS  *URINARY PROBLEMS  *BOWEL PROBLEMS  UNUSUAL RASH Items with * indicate a potential emergency and should be followed up as soon as possible.  Feel free to call the clinic you have any questions or concerns. The clinic phone number is [phone].  Please show the CHEMO ALERT CARD at check-in to the Emergency Department and triage nurse.   

## 2015-07-05 NOTE — Progress Notes (Signed)
Oncology Nurse Navigator Documentation  Oncology Nurse Navigator Flowsheets 07/05/2015  Navigator Encounter Type Clinic/MDC/spoke with patient and husband today at clinic.  She states she is feeling good.  She had some questions about side effects of immunotherapy.  I explained. She was thankful for the help.  Thanks Hinton Dyer  Patient Visit Type Follow-up  Treatment Phase Treatment  Barriers/Navigation Needs Education  Education -  Interventions Education Method  Education Method Verbal  Time Spent with Patient 15

## 2015-07-05 NOTE — Progress Notes (Signed)
[place] [phone]   Fax:([phone]) [place], MD [address] [place] [address]  DIAGNOSIS: Progressive non-small cell lung cancer initially diagnosed as Stage IIIA (T3, N2, M0) non-small cell lung cancer, squamous cell carcinoma diagnosed in September of 2015 presenting with central right lower lobe lung mass with postobstructive pneumonitis and mediastinal lymph nodes, was also a suspicious small nodule in the right upper lobe.  PRIOR THERAPY: Status post a course of concurrent chemoradiation with weekly carboplatin for AUC of 2 and paclitaxel 45 MG/M2, last dose was given 06/12/2014 with partial response.  CURRENT THERAPY: Tecentriq (Atezolizumab) 1200 mg IV every 3 weeks. First dose 06/14/2015. Status post 1 cycle.  INTERVAL HISTORY: [name] 69 y.o. female returns to the clinic today for follow-up visit accompanied by her husband.  She tolerated the first cycle of her immunotherapy with Tecentriq Huey Bienenstock) fairly well.  She denied having any significant nausea or vomiting. She has no diarrhea. She denied having any significant skin rash. The patient has no chest pain but continues to have mild shortness of breath and dry cough with no hemoptysis. She has a history of hyperthyroidism and continues to have mild tachycardia.  She will see her primary care physician next week for evaluation. She is here today to start cycle #2 of her immunotherapy with Tecentriq Huey Bienenstock).  MEDICAL HISTORY: Past Medical History  Diagnosis Date  . Thyroid disease     hypothyroidism  . Hx of thyroid cancer   . Status post bilateral breast implants   . Consolidation lung ([place]) 03/21/14    RLL PER CXR/CT  . Hypertension     just started,not on medication  . Shortness of breath     exertion  . Hypothyroidism   . Mitral valve prolapse   . Anxiety   . Arthritis   . Fibromyalgia   . Complication of anesthesia     headaches,very agitated while waking  up  . Cancer ([place])     thyroid  . Lung cancer ([place])     2015    ALLERGIES:  is allergic to niacin and related.  MEDICATIONS:  Current Outpatient Prescriptions  Medication Sig Dispense Refill  . acetaminophen (TYLENOL) 325 MG tablet Take 650 mg by mouth every 6 (six) hours as needed.    Marland Kitchen amLODipine (NORVASC) 5 MG tablet Take 5 mg by mouth daily.   4  . aspirin EC 81 MG tablet Take 162 mg by mouth at bedtime.     . clonazePAM (KLONOPIN) 0.5 MG tablet One three times a day with meals and 2 at bedtime (Patient taking differently: Take 0.5 mg by mouth as needed for anxiety. ) 75 tablet 0  . estradiol (ESTRACE) 0.5 MG tablet Take 0.5 mg by mouth daily.    Marland Kitchen HYDROcodone-homatropine (HYCODAN) 5-1.5 MG/5ML syrup Take 5 mLs by mouth every 6 (six) hours as needed for cough. 473 mL 0  . ibuprofen (ADVIL,MOTRIN) 100 MG tablet Take 200 mg by mouth every 6 (six) hours as needed for fever.    . levothyroxine (SYNTHROID, LEVOTHROID) 125 MCG tablet Take 137 mcg by mouth daily before breakfast.     . medroxyPROGESTERone (PROVERA) 2.5 MG tablet Take 2.5 mg by mouth daily.     No current facility-administered medications for this visit.    SURGICAL HISTORY:  Past Surgical History  Procedure Laterality Date  . Thyroidectomy    . Cataract extraction Right   .  Breast enhancement surgery    . Retinal detachment surgery    . Eye surgery    . Pars plana vitrectomy w/ repair of macular hole    . Back surgery      times 2  . Tonsillectomy    . Cervical cone biopsy    . Video bronchoscopy with endobronchial ultrasound N/A 04/07/2014    Procedure: VIDEO BRONCHOSCOPY WITH ENDOBRONCHIAL ULTRASOUND;  Surgeon: Melrose Nakayama, MD;  Location: [facility];  Service: Thoracic;  Laterality: N/A;    REVIEW OF SYSTEMS:  A comprehensive review of systems was negative except for: Constitutional: positive for fatigue Respiratory: positive for cough   PHYSICAL EXAMINATION:  General appearance: alert, cooperative, fatigued and no distress Head: Normocephalic, without obvious abnormality, atraumatic Neck: no adenopathy, no JVD, supple, symmetrical, trachea midline and thyroid not enlarged, symmetric, no tenderness/mass/nodules Lymph nodes: Cervical, supraclavicular, and axillary nodes normal. Resp: wheezes bilaterally Back: symmetric, no curvature. ROM normal. No CVA tenderness. Cardio: regular rate and rhythm, S1, S2 normal, no murmur, click, rub or gallop GI: soft, non-tender; bowel sounds normal; no masses,  no organomegaly Extremities: extremities normal, atraumatic, no cyanosis or edema Neurologic: Alert and oriented X 3, normal strength and tone. Normal symmetric reflexes. Normal coordination and gait  ECOG PERFORMANCE STATUS: 1 - Symptomatic but completely ambulatory  Blood pressure 148/81, pulse 111, temperature 98.3 F (36.8 C), temperature source Oral, resp. rate 18, height '5\' 8"'$  (1.727 m), weight 156 lb 12.8 oz (71.124 kg), SpO2 99 %.  LABORATORY DATA: Lab Results  Component Value Date   WBC 7.3 07/05/2015   HGB 14.0 07/05/2015   HCT 41.8 07/05/2015   MCV 91.9 07/05/2015   PLT 307 07/05/2015      Chemistry      Component Value Date/Time   NA 140 07/05/2015 0926   NA 138 07/28/2014 1611   K 4.1 07/05/2015 0926   K 3.9 07/28/2014 1611   CL 105 07/28/2014 1611   CO2 18* 07/05/2015 0926   CO2 26 07/28/2014 1611   BUN 11.7 07/05/2015 0926   BUN 12 07/28/2014 1611   CREATININE 0.8 07/05/2015 0926   CREATININE 0.53 07/28/2014 1611      Component Value Date/Time   CALCIUM 9.6 07/05/2015 0926   CALCIUM 9.3 07/28/2014 1611   ALKPHOS 101 07/05/2015 0926   ALKPHOS 197* 04/05/2014 1336   AST 17 07/05/2015 0926   AST 18 04/05/2014 1336   ALT 9 07/05/2015 0926   ALT 33 04/05/2014 1336   BILITOT 0.36 07/05/2015 0926   BILITOT 0.3 04/05/2014 1336       RADIOGRAPHIC STUDIES: No results found.  ASSESSMENT AND PLAN: This is a very  pleasant 69 years old white female with history of stage IIIA non-small cell lung cancer status post a course of concurrent chemoradiation with weekly carboplatin and paclitaxel with significant improvement. She has been observation since November 2015. The recent CT scan of the chest showed progression of 2.7 cm central right lower lobe pulmonary nodule with focal occlusion of the right mainstem bronchus. She had a PET scan performed recently that showed residual hypermetabolic right lower lobe pulmonary nodule with subcarinal nodal metastasis.  The patient was started on treatment with Tecentriq Huey Bienenstock) status post 1 cycle and tolerated it fairly well.  I recommended for the patient to proceed with cycle #2 today as scheduled. She will come back for follow-up visit in 3 weeks for reevaluation before starting cycle #3. She was advised to call immediately if she  has any concerning symptoms in the interval. The patient voices understanding of current disease status and treatment options and is in agreement with the current care plan.  All questions were answered. The patient knows to call the clinic with any problems, questions or concerns. We can certainly see the patient much sooner if necessary.  Disclaimer: This note was dictated with voice recognition software. Similar sounding words can inadvertently be transcribed and may not be corrected upon review.

## 2015-07-06 NOTE — Progress Notes (Signed)
Quick Note:  Call patient with the result and she will need to contact her PCP to adjust her thyroid medications. ______

## 2015-07-09 ENCOUNTER — Telehealth: Payer: Self-pay | Admitting: Medical Oncology

## 2015-07-09 NOTE — Telephone Encounter (Signed)
Message lef tfor pt to contact PCP. Faxed thyroid studies to PCP Dr Willey Blade.

## 2015-07-09 NOTE — Telephone Encounter (Signed)
-----   Message from Curt Bears, MD sent at 07/06/2015  4:32 PM EST ----- Call patient with the result and she will need to contact her PCP to adjust her thyroid medications.

## 2015-07-10 ENCOUNTER — Telehealth: Payer: Self-pay | Admitting: Medical Oncology

## 2015-07-10 ENCOUNTER — Encounter (HOSPITAL_COMMUNITY)
Admission: RE | Admit: 2015-07-10 | Discharge: 2015-07-10 | Disposition: A | Discharge status: Home / Self Care | Payer: Medicare Other | Source: Ambulatory Visit | Attending: Internal Medicine | Admitting: Internal Medicine

## 2015-07-10 ENCOUNTER — Telehealth: Payer: Self-pay

## 2015-07-10 DIAGNOSIS — C3490 Malignant neoplasm of unspecified part of unspecified bronchus or lung: Secondary | ICD-10-CM | POA: Diagnosis not present

## 2015-07-10 NOTE — Telephone Encounter (Signed)
Nothing urgent. She can wait until her PCP is back to adjust her dose.

## 2015-07-10 NOTE — Telephone Encounter (Signed)
I spoke to Dr Beverlee Nims staff - She called pt and told her Dr Willey Blade would call ptr Thursday about thyroid med adjustment.

## 2015-07-10 NOTE — Telephone Encounter (Signed)
Man called stating that yesterday [place] called about her thyroid medication and needing to see PCP. Her PCP is out of town until next week. Can Dr Julien Nordmann instruct her on her thyroid medication until he gets back in town?

## 2015-07-11 NOTE — Telephone Encounter (Signed)
I called Dr Willey Blade office yesterday and they spoke to pt and he will se her tomorrow. Labs faxed. Pt aware

## 2015-07-12 ENCOUNTER — Encounter (HOSPITAL_COMMUNITY)
Admission: RE | Admit: 2015-07-12 | Discharge: 2015-07-12 | Disposition: A | Discharge status: Home / Self Care | Payer: Medicare Other | Source: Ambulatory Visit | Attending: Internal Medicine | Admitting: Internal Medicine

## 2015-07-12 DIAGNOSIS — C3490 Malignant neoplasm of unspecified part of unspecified bronchus or lung: Secondary | ICD-10-CM | POA: Diagnosis not present

## 2015-07-17 ENCOUNTER — Encounter (HOSPITAL_COMMUNITY)
Admission: RE | Admit: 2015-07-17 | Discharge: 2015-07-17 | Disposition: A | Discharge status: Home / Self Care | Payer: Medicare Other | Source: Ambulatory Visit | Attending: Internal Medicine | Admitting: Internal Medicine

## 2015-07-17 DIAGNOSIS — C3490 Malignant neoplasm of unspecified part of unspecified bronchus or lung: Secondary | ICD-10-CM | POA: Diagnosis not present

## 2015-07-19 ENCOUNTER — Encounter (HOSPITAL_COMMUNITY): Admission: RE | Admit: 2015-07-19 | Payer: Medicare Other | Source: Ambulatory Visit

## 2015-07-24 ENCOUNTER — Encounter (HOSPITAL_COMMUNITY)
Admission: RE | Admit: 2015-07-24 | Discharge: 2015-07-24 | Disposition: A | Discharge status: Home / Self Care | Payer: Medicare Other | Source: Ambulatory Visit | Attending: Internal Medicine | Admitting: Internal Medicine

## 2015-07-24 DIAGNOSIS — C3490 Malignant neoplasm of unspecified part of unspecified bronchus or lung: Secondary | ICD-10-CM | POA: Diagnosis not present

## 2015-07-25 ENCOUNTER — Other Ambulatory Visit: Payer: Self-pay | Admitting: *Deleted

## 2015-07-25 DIAGNOSIS — C3491 Malignant neoplasm of unspecified part of right bronchus or lung: Secondary | ICD-10-CM

## 2015-07-26 ENCOUNTER — Encounter (HOSPITAL_COMMUNITY): Payer: Medicare Other

## 2015-07-26 ENCOUNTER — Ambulatory Visit (HOSPITAL_BASED_OUTPATIENT_CLINIC_OR_DEPARTMENT_OTHER): Payer: Medicare Other | Admitting: Internal Medicine

## 2015-07-26 ENCOUNTER — Encounter: Payer: Self-pay | Admitting: Internal Medicine

## 2015-07-26 ENCOUNTER — Ambulatory Visit (HOSPITAL_BASED_OUTPATIENT_CLINIC_OR_DEPARTMENT_OTHER): Payer: Medicare Other

## 2015-07-26 ENCOUNTER — Telehealth: Payer: Self-pay | Admitting: Internal Medicine

## 2015-07-26 ENCOUNTER — Other Ambulatory Visit (HOSPITAL_BASED_OUTPATIENT_CLINIC_OR_DEPARTMENT_OTHER): Payer: Medicare Other

## 2015-07-26 VITALS — BP 134/81 | HR 111 | Temp 96.0°F | Resp 18 | Ht 68.0 in | Wt 157.9 lb

## 2015-07-26 DIAGNOSIS — C3491 Malignant neoplasm of unspecified part of right bronchus or lung: Secondary | ICD-10-CM

## 2015-07-26 DIAGNOSIS — R0602 Shortness of breath: Secondary | ICD-10-CM

## 2015-07-26 DIAGNOSIS — E039 Hypothyroidism, unspecified: Secondary | ICD-10-CM | POA: Diagnosis not present

## 2015-07-26 DIAGNOSIS — E032 Hypothyroidism due to medicaments and other exogenous substances: Secondary | ICD-10-CM

## 2015-07-26 DIAGNOSIS — R05 Cough: Secondary | ICD-10-CM

## 2015-07-26 DIAGNOSIS — Z5112 Encounter for antineoplastic immunotherapy: Secondary | ICD-10-CM

## 2015-07-26 DIAGNOSIS — C3490 Malignant neoplasm of unspecified part of unspecified bronchus or lung: Secondary | ICD-10-CM | POA: Diagnosis not present

## 2015-07-26 DIAGNOSIS — Z85850 Personal history of malignant neoplasm of thyroid: Secondary | ICD-10-CM

## 2015-07-26 HISTORY — DX: Hypothyroidism, unspecified: E03.9

## 2015-07-26 LAB — COMPREHENSIVE METABOLIC PANEL
ALT: 19 U/L (ref 0–55)
AST: 24 U/L (ref 5–34)
Albumin: 4 g/dL (ref 3.5–5.0)
Alkaline Phosphatase: 97 U/L (ref 40–150)
Anion Gap: 9 mEq/L (ref 3–11)
BILIRUBIN TOTAL: 0.44 mg/dL (ref 0.20–1.20)
BUN: 10.4 mg/dL (ref 7.0–26.0)
CO2: 21 meq/L — AB (ref 22–29)
CREATININE: 0.8 mg/dL (ref 0.6–1.1)
Calcium: 9.5 mg/dL (ref 8.4–10.4)
Chloride: 110 mEq/L — ABNORMAL HIGH (ref 98–109)
EGFR: 82 mL/min/{1.73_m2} — AB (ref >90)
Glucose: 91 mg/dl (ref 70–140)
Potassium: 4.4 mEq/L (ref 3.5–5.1)
SODIUM: 140 meq/L (ref 136–145)
TOTAL PROTEIN: 7.3 g/dL (ref 6.4–8.3)

## 2015-07-26 LAB — CBC WITH DIFFERENTIAL/PLATELET
BASO%: 0.7 % (ref 0.0–2.0)
Basophils Absolute: 0 10*3/uL (ref 0.0–0.1)
EOS%: 2.5 % (ref 0.0–7.0)
Eosinophils Absolute: 0.1 10*3/uL (ref 0.0–0.5)
HCT: 40.5 % (ref 34.8–46.6)
HGB: 13.5 g/dL (ref 11.6–15.9)
LYMPH%: 28 % (ref 14.0–49.7)
MCH: 31 pg (ref 25.1–34.0)
MCHC: 33.3 g/dL (ref 31.5–36.0)
MCV: 93.1 fL (ref 79.5–101.0)
MONO#: 0.5 10*3/uL (ref 0.1–0.9)
MONO%: 8.7 % (ref 0.0–14.0)
NEUT%: 60.1 % (ref 38.4–76.8)
NEUTROS ABS: 3.3 10*3/uL (ref 1.5–6.5)
Platelets: 274 10*3/uL (ref 145–400)
RBC: 4.35 10*6/uL (ref 3.70–5.45)
RDW: 13.6 % (ref 11.2–14.5)
WBC: 5.5 10*3/uL (ref 3.9–10.3)
lymph#: 1.5 10*3/uL (ref 0.9–3.3)

## 2015-07-26 LAB — TSH: TSH: 1.455 m(IU)/L (ref 0.308–3.960)

## 2015-07-26 MED ORDER — SODIUM CHLORIDE 0.9 % IV SOLN
1200.0000 mg | Freq: Once | INTRAVENOUS | Status: AC
  Administered 2015-07-26: 1200 mg via INTRAVENOUS
  Filled 2015-07-26: qty 20

## 2015-07-26 MED ORDER — SODIUM CHLORIDE 0.9 % IV SOLN
Freq: Once | INTRAVENOUS | Status: AC
  Administered 2015-07-26: 13:00:00 via INTRAVENOUS

## 2015-07-26 NOTE — Patient Instructions (Signed)
[facility] Discharge Instructions for Patients Receiving Chemotherapy  Today you received the following chemotherapy agents Tecentriq To help prevent nausea and vomiting after your treatment, we encourage you to take your nausea medication as prescribed.   If you develop nausea and vomiting that is not controlled by your nausea medication, call the clinic.   BELOW ARE SYMPTOMS THAT SHOULD BE REPORTED IMMEDIATELY:  *FEVER GREATER THAN 100.5 F  *CHILLS WITH OR WITHOUT FEVER  NAUSEA AND VOMITING THAT IS NOT CONTROLLED WITH YOUR NAUSEA MEDICATION  *UNUSUAL SHORTNESS OF BREATH  *UNUSUAL BRUISING OR BLEEDING  TENDERNESS IN MOUTH AND THROAT WITH OR WITHOUT PRESENCE OF ULCERS  *URINARY PROBLEMS  *BOWEL PROBLEMS  UNUSUAL RASH Items with * indicate a potential emergency and should be followed up as soon as possible.  Feel free to call the clinic you have any questions or concerns. The clinic phone number is [phone].  Please show the CHEMO ALERT CARD at check-in to the Emergency Department and triage nurse.   

## 2015-07-26 NOTE — Progress Notes (Signed)
[place] [phone]   Fax:(336) [place], MD [address] [place] [address]  DIAGNOSIS: Progressive non-small cell lung cancer initially diagnosed as Stage IIIA (T3, N2, M0) non-small cell lung cancer, squamous cell carcinoma diagnosed in September of 2015 presenting with central right lower lobe lung mass with postobstructive pneumonitis and mediastinal lymph nodes, was also a suspicious small nodule in the right upper lobe.  PRIOR THERAPY: Status post a course of concurrent chemoradiation with weekly carboplatin for AUC of 2 and paclitaxel 45 MG/M2, last dose was given 06/12/2014 with partial response.  CURRENT THERAPY: Tecentriq (Atezolizumab) 1200 mg IV every 3 weeks. First dose 06/14/2015. Status post 2 cycles.  INTERVAL HISTORY: [name] 69 y.o. female returns to the clinic today for follow-up visit accompanied by her husband.  She tolerated the last cycle of her immunotherapy with Tecentriq Huey Bienenstock) fairly well.  She denied having any significant nausea or vomiting. She has no diarrhea. She denied having any significant skin rash. The patient has no chest pain but continues to have mild shortness of breath and dry cough with no hemoptysis. Her TSH was elevated and the patient is currently on treatment with levothyroxine which was adjusted by her primary care physician.  She will see her primary care physician next week for evaluation. She is here today to start cycle #3 of her immunotherapy with Tecentriq Huey Bienenstock).  MEDICAL HISTORY: Past Medical History  Diagnosis Date  . Thyroid disease     hypothyroidism  . Hx of thyroid cancer   . Status post bilateral breast implants   . Consolidation lung ([place]) 03/21/14    RLL PER CXR/CT  . Hypertension     just started,not on medication  . Shortness of breath     exertion  . Hypothyroidism   . Mitral valve prolapse   . Anxiety   .  Arthritis   . Fibromyalgia   . Complication of anesthesia     headaches,very agitated while waking  up  . Cancer ([place])     thyroid  . Lung cancer ([place])     2015  . Encounter for antineoplastic immunotherapy 07/05/2015    ALLERGIES:  is allergic to niacin and related.  MEDICATIONS:  Current Outpatient Prescriptions  Medication Sig Dispense Refill  . acetaminophen (TYLENOL) 325 MG tablet Take 650 mg by mouth every 6 (six) hours as needed.    Marland Kitchen amLODipine (NORVASC) 5 MG tablet Take 5 mg by mouth daily.   4  . aspirin EC 81 MG tablet Take 162 mg by mouth at bedtime.     . beclomethasone (QVAR) 80 MCG/ACT inhaler Take 2 puffs first thing in am and then another 2 puffs about 12 hours later.    . clonazePAM (KLONOPIN) 0.5 MG tablet One three times a day with meals and 2 at bedtime (Patient taking differently: Take 0.5 mg by mouth as needed for anxiety. ) 75 tablet 0  . estradiol (ESTRACE) 0.5 MG tablet Take 0.5 mg by mouth daily.    Marland Kitchen HYDROcodone-homatropine (HYCODAN) 5-1.5 MG/5ML syrup Take 5 mLs by mouth every 6 (six) hours as needed for cough. 473 mL 0  . ibuprofen (ADVIL,MOTRIN) 100 MG tablet Take 200 mg by mouth every 6 (six) hours as needed for fever.    . levothyroxine (SYNTHROID, LEVOTHROID) 125 MCG tablet Take 137 mcg by mouth daily before breakfast.     . medroxyPROGESTERone (PROVERA) 2.5 MG tablet  Take 2.5 mg by mouth daily.    . traMADol (ULTRAM) 50 MG tablet 1-2 every 4 hours as needed for cough or pain     No current facility-administered medications for this visit.    SURGICAL HISTORY:  Past Surgical History  Procedure Laterality Date  . Thyroidectomy    . Cataract extraction Right   . Breast enhancement surgery    . Retinal detachment surgery    . Eye surgery    . Pars plana vitrectomy w/ repair of macular hole    . Back surgery      times 2  . Tonsillectomy    . Cervical cone biopsy    . Video bronchoscopy with endobronchial ultrasound N/A 04/07/2014    Procedure:  VIDEO BRONCHOSCOPY WITH ENDOBRONCHIAL ULTRASOUND;  Surgeon: Melrose Nakayama, MD;  Location: [facility];  Service: Thoracic;  Laterality: N/A;    REVIEW OF SYSTEMS:  A comprehensive review of systems was negative except for: Constitutional: positive for fatigue Respiratory: positive for cough   PHYSICAL EXAMINATION: General appearance: alert, cooperative, fatigued and no distress Head: Normocephalic, without obvious abnormality, atraumatic Neck: no adenopathy, no JVD, supple, symmetrical, trachea midline and thyroid not enlarged, symmetric, no tenderness/mass/nodules Lymph nodes: Cervical, supraclavicular, and axillary nodes normal. Resp: wheezes bilaterally Back: symmetric, no curvature. ROM normal. No CVA tenderness. Cardio: regular rate and rhythm, S1, S2 normal, no murmur, click, rub or gallop GI: soft, non-tender; bowel sounds normal; no masses,  no organomegaly Extremities: extremities normal, atraumatic, no cyanosis or edema Neurologic: Alert and oriented X 3, normal strength and tone. Normal symmetric reflexes. Normal coordination and gait  ECOG PERFORMANCE STATUS: 1 - Symptomatic but completely ambulatory  There were no vitals taken for this visit.  LABORATORY DATA: Lab Results  Component Value Date   WBC 5.5 07/26/2015   HGB 13.5 07/26/2015   HCT 40.5 07/26/2015   MCV 93.1 07/26/2015   PLT 274 07/26/2015      Chemistry      Component Value Date/Time   NA 140 07/05/2015 0926   NA 138 07/28/2014 1611   K 4.1 07/05/2015 0926   K 3.9 07/28/2014 1611   CL 105 07/28/2014 1611   CO2 18* 07/05/2015 0926   CO2 26 07/28/2014 1611   BUN 11.7 07/05/2015 0926   BUN 12 07/28/2014 1611   CREATININE 0.8 07/05/2015 0926   CREATININE 0.53 07/28/2014 1611      Component Value Date/Time   CALCIUM 9.6 07/05/2015 0926   CALCIUM 9.3 07/28/2014 1611   ALKPHOS 101 07/05/2015 0926   ALKPHOS 197* 04/05/2014 1336   AST 17 07/05/2015 0926   AST 18 04/05/2014 1336   ALT 9 07/05/2015  0926   ALT 33 04/05/2014 1336   BILITOT 0.36 07/05/2015 0926   BILITOT 0.3 04/05/2014 1336       RADIOGRAPHIC STUDIES: No results found.  ASSESSMENT AND PLAN: This is a very pleasant 69 years old white female with history of stage IIIA non-small cell lung cancer status post a course of concurrent chemoradiation with weekly carboplatin and paclitaxel with significant improvement. She has been observation since November 2015. She was found to have disease progression on restaging his scan. The patient was started on treatment with Tecentriq Huey Bienenstock) status post 2 cycles and tolerated it fairly well.  I recommended for the patient to proceed with cycle #3 today as scheduled. She will come back for follow-up visit in 3 weeks for reevaluation before starting cycle #4 after repeating CT scan of  the chest for restaging of her disease. For the hypothyroidism, the patient will continue her treatment with levothyroxine as prescribed by her primary care physician. She was advised to call immediately if she has any concerning symptoms in the interval. The patient voices understanding of current disease status and treatment options and is in agreement with the current care plan.  All questions were answered. The patient knows to call the clinic with any problems, questions or concerns. We can certainly see the patient much sooner if necessary.  Disclaimer: This note was dictated with voice recognition software. Similar sounding words can inadvertently be transcribed and may not be corrected upon review.

## 2015-07-26 NOTE — Telephone Encounter (Signed)
Appts already scheduled for 1/19. Scheduled appt for ct at [facility] on 08/14/15. Gave pt AVS.

## 2015-07-31 ENCOUNTER — Encounter (HOSPITAL_COMMUNITY)
Admission: RE | Admit: 2015-07-31 | Discharge: 2015-07-31 | Disposition: A | Discharge status: Home / Self Care | Payer: Medicare Other | Source: Ambulatory Visit | Attending: Internal Medicine | Admitting: Internal Medicine

## 2015-07-31 DIAGNOSIS — C3490 Malignant neoplasm of unspecified part of unspecified bronchus or lung: Secondary | ICD-10-CM | POA: Insufficient documentation

## 2015-07-31 DIAGNOSIS — J984 Other disorders of lung: Secondary | ICD-10-CM | POA: Diagnosis not present

## 2015-08-02 ENCOUNTER — Encounter (HOSPITAL_COMMUNITY)
Admission: RE | Admit: 2015-08-02 | Discharge: 2015-08-02 | Disposition: A | Discharge status: Home / Self Care | Payer: Medicare Other | Source: Ambulatory Visit | Attending: Internal Medicine | Admitting: Internal Medicine

## 2015-08-02 DIAGNOSIS — C3490 Malignant neoplasm of unspecified part of unspecified bronchus or lung: Secondary | ICD-10-CM | POA: Diagnosis not present

## 2015-08-07 NOTE — Progress Notes (Signed)
Pulmonary Rehabilitation Program Outcomes Report   Orientation:  03/29/15 Graduate Date:  tbd Discharge Date:  tbd # of sessions completed: 18  Pulmonologist: Willey Blade Family MD:  Homero Fellers Time:  1330  A.  Exercise Program:  Tolerates exercise @ 2.68 METS for 15 minutes  B.  Mental Health:  Good mental attitude  C.  Education/Instruction/Skills  Knows THR for exercise  Uses Perceived Exertion Scale and/or Dyspnea Scale  D.  Nutrition/Weight Control/Body Composition:  Adherence to prescribed nutrition program: good    E.  Blood Lipids   No results found for: CHOL, HDL, LDLCALC, LDLDIRECT, TRIG, CHOLHDL  F.  Lifestyle Changes:  Making positive lifestyle changes  G.  Symptoms noted with exercise:  Asymptomatic  Report Completed By:  Stevphen Rochester RN   Comments:  This is the patients halfway progress note for [facility]. Patient is progressing well.

## 2015-08-09 ENCOUNTER — Encounter (HOSPITAL_COMMUNITY)
Admission: RE | Admit: 2015-08-09 | Discharge: 2015-08-09 | Disposition: A | Discharge status: Home / Self Care | Payer: Medicare Other | Source: Ambulatory Visit | Attending: Internal Medicine | Admitting: Internal Medicine

## 2015-08-09 DIAGNOSIS — C3490 Malignant neoplasm of unspecified part of unspecified bronchus or lung: Secondary | ICD-10-CM | POA: Diagnosis not present

## 2015-08-14 ENCOUNTER — Encounter (HOSPITAL_COMMUNITY)
Admission: RE | Admit: 2015-08-14 | Discharge: 2015-08-14 | Disposition: A | Discharge status: Home / Self Care | Payer: Medicare Other | Source: Ambulatory Visit | Attending: Internal Medicine | Admitting: Internal Medicine

## 2015-08-14 ENCOUNTER — Ambulatory Visit (HOSPITAL_COMMUNITY)
Admission: RE | Admit: 2015-08-14 | Discharge: 2015-08-14 | Disposition: A | Discharge status: Home / Self Care | Payer: Medicare Other | Source: Ambulatory Visit | Attending: Internal Medicine | Admitting: Internal Medicine

## 2015-08-14 ENCOUNTER — Ambulatory Visit (HOSPITAL_COMMUNITY): Payer: Medicare Other

## 2015-08-14 DIAGNOSIS — Z5112 Encounter for antineoplastic immunotherapy: Secondary | ICD-10-CM | POA: Diagnosis not present

## 2015-08-14 DIAGNOSIS — E032 Hypothyroidism due to medicaments and other exogenous substances: Secondary | ICD-10-CM | POA: Diagnosis not present

## 2015-08-14 DIAGNOSIS — Z85850 Personal history of malignant neoplasm of thyroid: Secondary | ICD-10-CM | POA: Diagnosis not present

## 2015-08-14 DIAGNOSIS — M797 Fibromyalgia: Secondary | ICD-10-CM | POA: Insufficient documentation

## 2015-08-14 DIAGNOSIS — I10 Essential (primary) hypertension: Secondary | ICD-10-CM | POA: Insufficient documentation

## 2015-08-14 DIAGNOSIS — R918 Other nonspecific abnormal finding of lung field: Secondary | ICD-10-CM | POA: Insufficient documentation

## 2015-08-14 DIAGNOSIS — C3491 Malignant neoplasm of unspecified part of right bronchus or lung: Secondary | ICD-10-CM | POA: Insufficient documentation

## 2015-08-14 DIAGNOSIS — Z87891 Personal history of nicotine dependence: Secondary | ICD-10-CM | POA: Insufficient documentation

## 2015-08-14 DIAGNOSIS — C3490 Malignant neoplasm of unspecified part of unspecified bronchus or lung: Secondary | ICD-10-CM | POA: Diagnosis not present

## 2015-08-14 MED ORDER — IOHEXOL 300 MG/ML  SOLN
75.0000 mL | Freq: Once | INTRAMUSCULAR | Status: AC | PRN
  Administered 2015-08-14: 75 mL via INTRAVENOUS

## 2015-08-15 ENCOUNTER — Other Ambulatory Visit: Payer: Self-pay | Admitting: Medical Oncology

## 2015-08-15 ENCOUNTER — Other Ambulatory Visit: Payer: Self-pay | Admitting: Internal Medicine

## 2015-08-16 ENCOUNTER — Other Ambulatory Visit (HOSPITAL_BASED_OUTPATIENT_CLINIC_OR_DEPARTMENT_OTHER): Payer: Medicare Other

## 2015-08-16 ENCOUNTER — Encounter: Payer: Self-pay | Admitting: Internal Medicine

## 2015-08-16 ENCOUNTER — Encounter (HOSPITAL_COMMUNITY): Payer: Medicare Other

## 2015-08-16 ENCOUNTER — Ambulatory Visit (HOSPITAL_BASED_OUTPATIENT_CLINIC_OR_DEPARTMENT_OTHER): Payer: Medicare Other | Admitting: Internal Medicine

## 2015-08-16 ENCOUNTER — Telehealth: Payer: Self-pay | Admitting: Internal Medicine

## 2015-08-16 ENCOUNTER — Other Ambulatory Visit: Payer: Self-pay | Admitting: Medical Oncology

## 2015-08-16 ENCOUNTER — Ambulatory Visit (HOSPITAL_BASED_OUTPATIENT_CLINIC_OR_DEPARTMENT_OTHER): Payer: Medicare Other

## 2015-08-16 VITALS — BP 145/85 | HR 107 | Temp 99.1°F | Resp 18 | Ht 68.0 in | Wt 155.3 lb

## 2015-08-16 DIAGNOSIS — C3431 Malignant neoplasm of lower lobe, right bronchus or lung: Secondary | ICD-10-CM

## 2015-08-16 DIAGNOSIS — Z85850 Personal history of malignant neoplasm of thyroid: Secondary | ICD-10-CM

## 2015-08-16 DIAGNOSIS — R05 Cough: Secondary | ICD-10-CM

## 2015-08-16 DIAGNOSIS — R0602 Shortness of breath: Secondary | ICD-10-CM

## 2015-08-16 DIAGNOSIS — C3491 Malignant neoplasm of unspecified part of right bronchus or lung: Secondary | ICD-10-CM | POA: Diagnosis not present

## 2015-08-16 DIAGNOSIS — E039 Hypothyroidism, unspecified: Secondary | ICD-10-CM

## 2015-08-16 DIAGNOSIS — Z5112 Encounter for antineoplastic immunotherapy: Secondary | ICD-10-CM | POA: Diagnosis not present

## 2015-08-16 DIAGNOSIS — Z79899 Other long term (current) drug therapy: Secondary | ICD-10-CM

## 2015-08-16 DIAGNOSIS — E032 Hypothyroidism due to medicaments and other exogenous substances: Secondary | ICD-10-CM

## 2015-08-16 DIAGNOSIS — J700 Acute pulmonary manifestations due to radiation: Secondary | ICD-10-CM

## 2015-08-16 LAB — CBC WITH DIFFERENTIAL/PLATELET
BASO%: 0.7 % (ref 0.0–2.0)
BASOS ABS: 0 10*3/uL (ref 0.0–0.1)
EOS ABS: 0.1 10*3/uL (ref 0.0–0.5)
EOS%: 2 % (ref 0.0–7.0)
HEMATOCRIT: 41.6 % (ref 34.8–46.6)
HGB: 14 g/dL (ref 11.6–15.9)
LYMPH#: 1.6 10*3/uL (ref 0.9–3.3)
LYMPH%: 27.4 % (ref 14.0–49.7)
MCH: 31.1 pg (ref 25.1–34.0)
MCHC: 33.7 g/dL (ref 31.5–36.0)
MCV: 92.4 fL (ref 79.5–101.0)
MONO#: 0.7 10*3/uL (ref 0.1–0.9)
MONO%: 11.6 % (ref 0.0–14.0)
NEUT#: 3.4 10*3/uL (ref 1.5–6.5)
NEUT%: 58.3 % (ref 38.4–76.8)
PLATELETS: 283 10*3/uL (ref 145–400)
RBC: 4.5 10*6/uL (ref 3.70–5.45)
RDW: 13.2 % (ref 11.2–14.5)
WBC: 5.9 10*3/uL (ref 3.9–10.3)

## 2015-08-16 LAB — COMPREHENSIVE METABOLIC PANEL
ALBUMIN: 3.9 g/dL (ref 3.5–5.0)
ALK PHOS: 90 U/L (ref 40–150)
ALT: 11 U/L (ref 0–55)
ANION GAP: 9 meq/L (ref 3–11)
AST: 14 U/L (ref 5–34)
BUN: 12.1 mg/dL (ref 7.0–26.0)
CALCIUM: 9.5 mg/dL (ref 8.4–10.4)
CHLORIDE: 110 meq/L — AB (ref 98–109)
CO2: 20 mEq/L — ABNORMAL LOW (ref 22–29)
Creatinine: 0.7 mg/dL (ref 0.6–1.1)
EGFR: 84 mL/min/{1.73_m2} — AB (ref >90)
Glucose: 86 mg/dl (ref 70–140)
POTASSIUM: 4.1 meq/L (ref 3.5–5.1)
Sodium: 139 mEq/L (ref 136–145)
Total Bilirubin: 0.38 mg/dL (ref 0.20–1.20)
Total Protein: 7.1 g/dL (ref 6.4–8.3)

## 2015-08-16 LAB — TSH: TSH: 0.944 m[IU]/L (ref 0.308–3.960)

## 2015-08-16 MED ORDER — SODIUM CHLORIDE 0.9 % IV SOLN
Freq: Once | INTRAVENOUS | Status: AC
  Administered 2015-08-16: 13:00:00 via INTRAVENOUS

## 2015-08-16 MED ORDER — SODIUM CHLORIDE 0.9 % IV SOLN
1200.0000 mg | Freq: Once | INTRAVENOUS | Status: AC
  Administered 2015-08-16: 1200 mg via INTRAVENOUS
  Filled 2015-08-16: qty 20

## 2015-08-16 NOTE — Patient Instructions (Signed)
[facility] Discharge Instructions for Patients Receiving Chemotherapy  Today you received the following chemotherapy agents Tecentriq To help prevent nausea and vomiting after your treatment, we encourage you to take your nausea medication as prescribed.   If you develop nausea and vomiting that is not controlled by your nausea medication, call the clinic.   BELOW ARE SYMPTOMS THAT SHOULD BE REPORTED IMMEDIATELY:  *FEVER GREATER THAN 100.5 F  *CHILLS WITH OR WITHOUT FEVER  NAUSEA AND VOMITING THAT IS NOT CONTROLLED WITH YOUR NAUSEA MEDICATION  *UNUSUAL SHORTNESS OF BREATH  *UNUSUAL BRUISING OR BLEEDING  TENDERNESS IN MOUTH AND THROAT WITH OR WITHOUT PRESENCE OF ULCERS  *URINARY PROBLEMS  *BOWEL PROBLEMS  UNUSUAL RASH Items with * indicate a potential emergency and should be followed up as soon as possible.  Feel free to call the clinic you have any questions or concerns. The clinic phone number is [phone].  Please show the CHEMO ALERT CARD at check-in to the Emergency Department and triage nurse.   

## 2015-08-16 NOTE — Progress Notes (Signed)
[place] Telephone:([phone]   Fax:(336) [place], MD [address] [place] [address]  DIAGNOSIS: Progressive non-small cell lung cancer initially diagnosed as Stage IIIA (T3, N2, M0) non-small cell lung cancer, squamous cell carcinoma diagnosed in September of 2015 presenting with central right lower lobe lung mass with postobstructive pneumonitis and mediastinal lymph nodes, was also a suspicious small nodule in the right upper lobe.  PRIOR THERAPY: Status post a course of concurrent chemoradiation with weekly carboplatin for AUC of 2 and paclitaxel 45 MG/M2, last dose was given 06/12/2014 with partial response.  CURRENT THERAPY: Tecentriq (Atezolizumab) 1200 mg IV every 3 weeks. First dose 06/14/2015. Status post 3 cycles.  INTERVAL HISTORY: [name] 70 y.o. female returns to the clinic today for follow-up visit accompanied by her husband.  She tolerated the last cycle of her immunotherapy with Tecentriq Huey Bienenstock) fairly well.  No significant change from the last visit. She denied having any significant nausea or vomiting. She has no diarrhea. She denied having any significant skin rash. The patient has no chest pain but continues to have mild shortness of breath and dry cough with no hemoptysis.  She had repeat CT scan of the chest performed recently and she is here today for evaluation and discussion of her scan results before starting cycle #4 of her immunotherapy.   MEDICAL HISTORY: Past Medical History  Diagnosis Date  . Thyroid disease     hypothyroidism  . Hx of thyroid cancer   . Status post bilateral breast implants   . Consolidation lung ([place]) 03/21/14    RLL PER CXR/CT  . Hypertension     just started,not on medication  . Shortness of breath     exertion  . Hypothyroidism   . Mitral valve prolapse   . Anxiety   . Arthritis   . Fibromyalgia   . Complication of anesthesia    headaches,very agitated while waking  up  . Cancer ([place])     thyroid  . Lung cancer ([place])     2015  . Encounter for antineoplastic immunotherapy 07/05/2015  . Hypothyroidism 07/26/2015    ALLERGIES:  is allergic to niacin and related.  MEDICATIONS:  Current Outpatient Prescriptions  Medication Sig Dispense Refill  . acetaminophen (TYLENOL) 325 MG tablet Take 650 mg by mouth every 6 (six) hours as needed.    Marland Kitchen amLODipine (NORVASC) 5 MG tablet Take 5 mg by mouth daily.   4  . aspirin EC 81 MG tablet Take 162 mg by mouth at bedtime.     . beclomethasone (QVAR) 80 MCG/ACT inhaler Take 2 puffs first thing in am and then another 2 puffs about 12 hours later.    . clonazePAM (KLONOPIN) 0.5 MG tablet One three times a day with meals and 2 at bedtime (Patient taking differently: Take 0.5 mg by mouth as needed for anxiety. ) 75 tablet 0  . estradiol (ESTRACE) 0.5 MG tablet Take 0.5 mg by mouth daily.    Marland Kitchen HYDROcodone-homatropine (HYCODAN) 5-1.5 MG/5ML syrup Take 5 mLs by mouth every 6 (six) hours as needed for cough. 473 mL 0  . ibuprofen (ADVIL,MOTRIN) 100 MG tablet Take 200 mg by mouth every 6 (six) hours as needed for fever.    . levothyroxine (SYNTHROID, LEVOTHROID) 125 MCG tablet Take 137 mcg by mouth daily before breakfast.     . medroxyPROGESTERone (PROVERA) 2.5 MG tablet Take 2.5 mg by mouth daily.    Marland Kitchen  traMADol (ULTRAM) 50 MG tablet 1-2 every 4 hours as needed for cough or pain     No current facility-administered medications for this visit.    SURGICAL HISTORY:  Past Surgical History  Procedure Laterality Date  . Thyroidectomy    . Cataract extraction Right   . Breast enhancement surgery    . Retinal detachment surgery    . Eye surgery    . Pars plana vitrectomy w/ repair of macular hole    . Back surgery      times 2  . Tonsillectomy    . Cervical cone biopsy    . Video bronchoscopy with endobronchial ultrasound N/A 04/07/2014    Procedure: VIDEO BRONCHOSCOPY WITH  ENDOBRONCHIAL ULTRASOUND;  Surgeon: Melrose Nakayama, MD;  Location: [facility];  Service: Thoracic;  Laterality: N/A;    REVIEW OF SYSTEMS:  A comprehensive review of systems was negative except for: Constitutional: positive for fatigue Respiratory: positive for cough   PHYSICAL EXAMINATION: General appearance: alert, cooperative, fatigued and no distress Head: Normocephalic, without obvious abnormality, atraumatic Neck: no adenopathy, no JVD, supple, symmetrical, trachea midline and thyroid not enlarged, symmetric, no tenderness/mass/nodules Lymph nodes: Cervical, supraclavicular, and axillary nodes normal. Resp: wheezes bilaterally Back: symmetric, no curvature. ROM normal. No CVA tenderness. Cardio: regular rate and rhythm, S1, S2 normal, no murmur, click, rub or gallop GI: soft, non-tender; bowel sounds normal; no masses,  no organomegaly Extremities: extremities normal, atraumatic, no cyanosis or edema Neurologic: Alert and oriented X 3, normal strength and tone. Normal symmetric reflexes. Normal coordination and gait  ECOG PERFORMANCE STATUS: 1 - Symptomatic but completely ambulatory  Blood pressure 145/85, pulse 107, temperature 99.1 F (37.3 C), temperature source Oral, resp. rate 18, height '5\' 8"'$  (1.727 m), weight 155 lb 4.8 oz (70.444 kg), SpO2 98 %.  LABORATORY DATA: Lab Results  Component Value Date   WBC 5.9 08/16/2015   HGB 14.0 08/16/2015   HCT 41.6 08/16/2015   MCV 92.4 08/16/2015   PLT 283 08/16/2015      Chemistry      Component Value Date/Time   NA 139 08/16/2015 1141   NA 138 07/28/2014 1611   K 4.1 08/16/2015 1141   K 3.9 07/28/2014 1611   CL 105 07/28/2014 1611   CO2 20* 08/16/2015 1141   CO2 26 07/28/2014 1611   BUN 12.1 08/16/2015 1141   BUN 12 07/28/2014 1611   CREATININE 0.7 08/16/2015 1141   CREATININE 0.53 07/28/2014 1611      Component Value Date/Time   CALCIUM 9.5 08/16/2015 1141   CALCIUM 9.3 07/28/2014 1611   ALKPHOS 90 08/16/2015 1141    ALKPHOS 197* 04/05/2014 1336   AST 14 08/16/2015 1141   AST 18 04/05/2014 1336   ALT 11 08/16/2015 1141   ALT 33 04/05/2014 1336   BILITOT 0.38 08/16/2015 1141   BILITOT 0.3 04/05/2014 1336       RADIOGRAPHIC STUDIES: Ct Chest W Contrast  08/14/2015  CLINICAL DATA:  Staging non-small-cell lung carcinoma RIGHT lung diagnosed in September 2015 post immunotherapy, history hypertension, thyroid cancer, fibromyalgia, former smoker EXAM: CT CHEST WITH CONTRAST TECHNIQUE: Multidetector CT imaging of the chest was performed during intravenous contrast administration. Sagittal and coronal MPR images reconstructed from axial data set. CONTRAST:  2m OMNIPAQUE IOHEXOL 300 MG/ML  SOLN IV COMPARISON:  PET-CT 05/30/2015, CT chest 05/10/2015 FINDINGS: Atherosclerotic calcifications aorta and coronary arteries. Thoracic vascular structures grossly patent on nondedicated exam. Triangular area of increased enhancement within the lateral RIGHT lobe  of liver with a central small 8 mm enhancing nodule, previously 10 mm. No additional upper abdominal findings. Minimal chronic pericardial effusion. BILATERAL breast prostheses noted. RIGHT lower lobe mass identified posterior to the RIGHT hilum, 3.2 x 2.4 x 2.9 cm, increased since 05/10/2015 when this measured 2.7 x 2.1 x 2.5 cm. Subtotal occlusion of the bronchus intermedius by the mass. Radiation fibrosis changes RIGHT upper lobe medially with areas of saccular bronchiectasis. This volume loss in RIGHT hemi thorax with mild mediastinal shift to the RIGHT. No additional pulmonary mass, nodule, infiltrate, or pneumothorax. Mild chronic pleural thickening and/or fluid in the RIGHT hemi thorax appears little changed. Subpleural focus of scarring antral lateral LEFT upper lobe image 13 stable. No acute osseous findings IMPRESSION: Mild increase in size of RIGHT lower lobe mass with marked narrowing and subtotal occlusion of the bronchus intermedius. Persistent radiation  fibrosis changes RIGHT upper lobe volume loss. Chronic pleural thickening or minimal pleural fluid in the RIGHT hemi thorax. Persistent visualization of a small hypervascular focus in the RIGHT lobe of the liver, slightly smaller than on the previous exam. No new intra thoracic mass or adenopathy. Electronically Signed   By: Lavonia Dana M.D.   On: 08/14/2015 15:24    ASSESSMENT AND PLAN: This is a very pleasant 70 years old white female with history of stage IIIA non-small cell lung cancer status post a course of concurrent chemoradiation with weekly carboplatin and paclitaxel with significant improvement. She has been observation since November 2015. She was found to have disease progression on restaging his scan. The patient was started on treatment with Tecentriq Huey Bienenstock) status post 3 cycles and tolerated it fairly well. The recent CT scan of the chest showed mild increase in the size of right lower lobe mass with marked narrowing and subtotal occlusion of the bronchus intermedius. There was also radiation changes in the right upper lobe. I discussed the scan results with the patient and her husband. I had a lengthy discussion with her about her treatment options and whether to continue with immunotherapy for few more cycles taken into consideration the possibility of mild pseudo-progression. The patient and her husband are interested in continuing immunotherapy for now. She will proceed with cycle #4 today.  She will come back for follow-up visit in 3 weeks for reevaluation before starting cycle #5 after repeating CT scan of the chest for restaging of her disease. For the hypothyroidism,  her TSH is better today. the patient will continue her treatment with levothyroxine as prescribed by her primary care physician. She was advised to call immediately if she has any concerning symptoms in the interval. The patient voices understanding of current disease status and treatment options and is in  agreement with the current care plan.  All questions were answered. The patient knows to call the clinic with any problems, questions or concerns. We can certainly see the patient much sooner if necessary.  Disclaimer: This note was dictated with voice recognition software. Similar sounding words can inadvertently be transcribed and may not be corrected upon review.

## 2015-08-16 NOTE — Telephone Encounter (Signed)
per pof to sch pt appt-gave pt copy of avs-sent MW/Melissa email to sch trmt

## 2015-08-21 ENCOUNTER — Encounter (HOSPITAL_COMMUNITY)
Admission: RE | Admit: 2015-08-21 | Discharge: 2015-08-21 | Disposition: A | Discharge status: Home / Self Care | Payer: Medicare Other | Source: Ambulatory Visit | Attending: Internal Medicine | Admitting: Internal Medicine

## 2015-08-21 DIAGNOSIS — C3490 Malignant neoplasm of unspecified part of unspecified bronchus or lung: Secondary | ICD-10-CM | POA: Diagnosis not present

## 2015-08-23 ENCOUNTER — Encounter (HOSPITAL_COMMUNITY): Payer: Medicare Other

## 2015-08-28 ENCOUNTER — Encounter (HOSPITAL_COMMUNITY): Payer: Medicare Other

## 2015-08-30 ENCOUNTER — Encounter (HOSPITAL_COMMUNITY): Payer: Medicare Other

## 2015-09-04 ENCOUNTER — Encounter (HOSPITAL_COMMUNITY)
Admission: RE | Admit: 2015-09-04 | Discharge: 2015-09-04 | Disposition: A | Discharge status: Home / Self Care | Payer: Medicare Other | Source: Ambulatory Visit | Attending: Internal Medicine | Admitting: Internal Medicine

## 2015-09-04 DIAGNOSIS — J984 Other disorders of lung: Secondary | ICD-10-CM | POA: Diagnosis not present

## 2015-09-04 DIAGNOSIS — C3490 Malignant neoplasm of unspecified part of unspecified bronchus or lung: Secondary | ICD-10-CM | POA: Diagnosis not present

## 2015-09-06 ENCOUNTER — Ambulatory Visit (HOSPITAL_BASED_OUTPATIENT_CLINIC_OR_DEPARTMENT_OTHER): Payer: Medicare Other | Admitting: Internal Medicine

## 2015-09-06 ENCOUNTER — Ambulatory Visit (HOSPITAL_BASED_OUTPATIENT_CLINIC_OR_DEPARTMENT_OTHER): Payer: Medicare Other

## 2015-09-06 ENCOUNTER — Encounter: Payer: Self-pay | Admitting: Internal Medicine

## 2015-09-06 ENCOUNTER — Encounter (HOSPITAL_COMMUNITY): Payer: Medicare Other

## 2015-09-06 ENCOUNTER — Other Ambulatory Visit (HOSPITAL_BASED_OUTPATIENT_CLINIC_OR_DEPARTMENT_OTHER): Payer: Medicare Other

## 2015-09-06 VITALS — BP 154/92 | HR 113 | Temp 98.4°F | Resp 17 | Ht 68.0 in | Wt 154.3 lb

## 2015-09-06 DIAGNOSIS — E039 Hypothyroidism, unspecified: Secondary | ICD-10-CM | POA: Diagnosis not present

## 2015-09-06 DIAGNOSIS — R0602 Shortness of breath: Secondary | ICD-10-CM

## 2015-09-06 DIAGNOSIS — R05 Cough: Secondary | ICD-10-CM | POA: Diagnosis not present

## 2015-09-06 DIAGNOSIS — Z85850 Personal history of malignant neoplasm of thyroid: Secondary | ICD-10-CM

## 2015-09-06 DIAGNOSIS — C3431 Malignant neoplasm of lower lobe, right bronchus or lung: Secondary | ICD-10-CM | POA: Diagnosis not present

## 2015-09-06 DIAGNOSIS — C3491 Malignant neoplasm of unspecified part of right bronchus or lung: Secondary | ICD-10-CM

## 2015-09-06 DIAGNOSIS — E032 Hypothyroidism due to medicaments and other exogenous substances: Secondary | ICD-10-CM

## 2015-09-06 DIAGNOSIS — Z5112 Encounter for antineoplastic immunotherapy: Secondary | ICD-10-CM | POA: Diagnosis not present

## 2015-09-06 LAB — COMPREHENSIVE METABOLIC PANEL
ALK PHOS: 103 U/L (ref 40–150)
ALT: 17 U/L (ref 0–55)
AST: 17 U/L (ref 5–34)
Albumin: 3.9 g/dL (ref 3.5–5.0)
Anion Gap: 13 mEq/L — ABNORMAL HIGH (ref 3–11)
BUN: 13.8 mg/dL (ref 7.0–26.0)
CHLORIDE: 109 meq/L (ref 98–109)
CO2: 18 mEq/L — ABNORMAL LOW (ref 22–29)
Calcium: 9.6 mg/dL (ref 8.4–10.4)
Creatinine: 0.8 mg/dL (ref 0.6–1.1)
EGFR: 80 mL/min/{1.73_m2} — ABNORMAL LOW (ref >90)
GLUCOSE: 95 mg/dL (ref 70–140)
Potassium: 4.2 mEq/L (ref 3.5–5.1)
Sodium: 140 mEq/L (ref 136–145)
TOTAL PROTEIN: 7.4 g/dL (ref 6.4–8.3)
Total Bilirubin: 0.43 mg/dL (ref 0.20–1.20)

## 2015-09-06 LAB — CBC WITH DIFFERENTIAL/PLATELET
BASO%: 0.8 % (ref 0.0–2.0)
Basophils Absolute: 0.1 10*3/uL (ref 0.0–0.1)
EOS%: 1.7 % (ref 0.0–7.0)
Eosinophils Absolute: 0.1 10*3/uL (ref 0.0–0.5)
HEMATOCRIT: 44.2 % (ref 34.8–46.6)
HGB: 14.5 g/dL (ref 11.6–15.9)
LYMPH#: 1.8 10*3/uL (ref 0.9–3.3)
LYMPH%: 24.2 % (ref 14.0–49.7)
MCH: 30.3 pg (ref 25.1–34.0)
MCHC: 32.9 g/dL (ref 31.5–36.0)
MCV: 92.2 fL (ref 79.5–101.0)
MONO#: 0.7 10*3/uL (ref 0.1–0.9)
MONO%: 9.3 % (ref 0.0–14.0)
NEUT%: 64 % (ref 38.4–76.8)
NEUTROS ABS: 4.7 10*3/uL (ref 1.5–6.5)
PLATELETS: 298 10*3/uL (ref 145–400)
RBC: 4.8 10*6/uL (ref 3.70–5.45)
RDW: 13 % (ref 11.2–14.5)
WBC: 7.3 10*3/uL (ref 3.9–10.3)

## 2015-09-06 LAB — TSH: TSH: 0.304 m[IU]/L — AB (ref 0.308–3.960)

## 2015-09-06 MED ORDER — SODIUM CHLORIDE 0.9 % IV SOLN
Freq: Once | INTRAVENOUS | Status: AC
Start: 2015-09-06 — End: 2015-09-06
  Administered 2015-09-06: 12:00:00 via INTRAVENOUS

## 2015-09-06 MED ORDER — ATEZOLIZUMAB CHEMO INJECTION 1200 MG/20ML
1200.0000 mg | Freq: Once | INTRAVENOUS | Status: AC
  Administered 2015-09-06: 1200 mg via INTRAVENOUS
  Filled 2015-09-06: qty 20

## 2015-09-06 NOTE — Progress Notes (Signed)
[facility] [place] Telephone:([phone]   Fax:(336) [place], MD [address] [place] [address]  DIAGNOSIS: Progressive non-small cell lung cancer initially diagnosed as Stage IIIA (T3, N2, M0) non-small cell lung cancer, squamous cell carcinoma diagnosed in September of 2015 presenting with central right lower lobe lung mass with postobstructive pneumonitis and mediastinal lymph nodes, was also a suspicious small nodule in the right upper lobe.  PRIOR THERAPY: Status post a course of concurrent chemoradiation with weekly carboplatin for AUC of 2 and paclitaxel 45 MG/M2, last dose was given 06/12/2014 with partial response.  CURRENT THERAPY: Tecentriq (Atezolizumab) 1200 mg IV every 3 weeks. First dose 06/14/2015. Status post 4 cycles.  INTERVAL HISTORY: [name] 70 y.o. female returns to the clinic today for follow-up visit accompanied by her husband.  She is currently on treatment with immunotherapy with Tecentriq Acupuncturist) and is tolerating it fairly well.  She denied having any significant nausea or vomiting. She has no diarrhea. She denied having any significant skin rash. The patient has no chest pain but continues to have mild shortness of breath and dry cough with no hemoptysis.  She is here today to start cycle #5 of her treatment.  MEDICAL HISTORY: Past Medical History  Diagnosis Date  . Thyroid disease     hypothyroidism  . Hx of thyroid cancer   . Status post bilateral breast implants   . Consolidation lung ([facility]) 03/21/14    RLL PER CXR/CT  . Hypertension     just started,not on medication  . Shortness of breath     exertion  . Hypothyroidism   . Mitral valve prolapse   . Anxiety   . Arthritis   . Fibromyalgia   . Complication of anesthesia     headaches,very agitated while waking  up  . Cancer ([facility])     thyroid  . Lung cancer ([facility])     2015  . Encounter for antineoplastic immunotherapy  07/05/2015  . Hypothyroidism 07/26/2015    ALLERGIES:  is allergic to niacin and related.  MEDICATIONS:  Current Outpatient Prescriptions  Medication Sig Dispense Refill  . acetaminophen (TYLENOL) 325 MG tablet Take 650 mg by mouth every 6 (six) hours as needed.    Marland Kitchen amLODipine (NORVASC) 5 MG tablet Take 5 mg by mouth daily.   4  . aspirin EC 81 MG tablet Take 162 mg by mouth at bedtime.     . clonazePAM (KLONOPIN) 0.5 MG tablet One three times a day with meals and 2 at bedtime (Patient taking differently: Take 0.5 mg by mouth as needed for anxiety. ) 75 tablet 0  . estradiol (ESTRACE) 0.5 MG tablet Take 0.5 mg by mouth daily.    Marland Kitchen HYDROcodone-homatropine (HYCODAN) 5-1.5 MG/5ML syrup Take 5 mLs by mouth every 6 (six) hours as needed for cough. 473 mL 0  . ibuprofen (ADVIL,MOTRIN) 100 MG tablet Take 200 mg by mouth every 6 (six) hours as needed for fever.    . levothyroxine (SYNTHROID, LEVOTHROID) 125 MCG tablet Take 137 mcg by mouth daily before breakfast.     . medroxyPROGESTERone (PROVERA) 2.5 MG tablet Take 2.5 mg by mouth daily.     No current facility-administered medications for this visit.    SURGICAL HISTORY:  Past Surgical History  Procedure Laterality Date  . Thyroidectomy    . Cataract extraction Right   . Breast enhancement surgery    . Retinal detachment surgery    .  Eye surgery    . Pars plana vitrectomy w/ repair of macular hole    . Back surgery      times 2  . Tonsillectomy    . Cervical cone biopsy    . Video bronchoscopy with endobronchial ultrasound N/A 04/07/2014    Procedure: VIDEO BRONCHOSCOPY WITH ENDOBRONCHIAL ULTRASOUND;  Surgeon: Melrose Nakayama, MD;  Location: [facility];  Service: Thoracic;  Laterality: N/A;    REVIEW OF SYSTEMS:  A comprehensive review of systems was negative except for: Constitutional: positive for fatigue Respiratory: positive for cough   PHYSICAL EXAMINATION: General appearance: alert, cooperative, fatigued and no  distress Head: Normocephalic, without obvious abnormality, atraumatic Neck: no adenopathy, no JVD, supple, symmetrical, trachea midline and thyroid not enlarged, symmetric, no tenderness/mass/nodules Lymph nodes: Cervical, supraclavicular, and axillary nodes normal. Resp: wheezes bilaterally Back: symmetric, no curvature. ROM normal. No CVA tenderness. Cardio: regular rate and rhythm, S1, S2 normal, no murmur, click, rub or gallop GI: soft, non-tender; bowel sounds normal; no masses,  no organomegaly Extremities: extremities normal, atraumatic, no cyanosis or edema Neurologic: Alert and oriented X 3, normal strength and tone. Normal symmetric reflexes. Normal coordination and gait  ECOG PERFORMANCE STATUS: 1 - Symptomatic but completely ambulatory  Blood pressure 154/92, pulse 113, temperature 98.4 F (36.9 C), temperature source Oral, resp. rate 17, height '5\' 8"'$  (1.727 m), weight 154 lb 4.8 oz (69.99 kg), SpO2 98 %.  LABORATORY DATA: Lab Results  Component Value Date   WBC 7.3 09/06/2015   HGB 14.5 09/06/2015   HCT 44.2 09/06/2015   MCV 92.2 09/06/2015   PLT 298 09/06/2015      Chemistry      Component Value Date/Time   NA 140 09/06/2015 0918   NA 138 07/28/2014 1611   K 4.2 09/06/2015 0918   K 3.9 07/28/2014 1611   CL 105 07/28/2014 1611   CO2 18* 09/06/2015 0918   CO2 26 07/28/2014 1611   BUN 13.8 09/06/2015 0918   BUN 12 07/28/2014 1611   CREATININE 0.8 09/06/2015 0918   CREATININE 0.53 07/28/2014 1611      Component Value Date/Time   CALCIUM 9.6 09/06/2015 0918   CALCIUM 9.3 07/28/2014 1611   ALKPHOS 103 09/06/2015 0918   ALKPHOS 197* 04/05/2014 1336   AST 17 09/06/2015 0918   AST 18 04/05/2014 1336   ALT 17 09/06/2015 0918   ALT 33 04/05/2014 1336   BILITOT 0.43 09/06/2015 0918   BILITOT 0.3 04/05/2014 1336       RADIOGRAPHIC STUDIES: Ct Chest W Contrast  08/14/2015  CLINICAL DATA:  Staging non-small-cell lung carcinoma RIGHT lung diagnosed in September  2015 post immunotherapy, history hypertension, thyroid cancer, fibromyalgia, former smoker EXAM: CT CHEST WITH CONTRAST TECHNIQUE: Multidetector CT imaging of the chest was performed during intravenous contrast administration. Sagittal and coronal MPR images reconstructed from axial data set. CONTRAST:  99m OMNIPAQUE IOHEXOL 300 MG/ML  SOLN IV COMPARISON:  PET-CT 05/30/2015, CT chest 05/10/2015 FINDINGS: Atherosclerotic calcifications aorta and coronary arteries. Thoracic vascular structures grossly patent on nondedicated exam. Triangular area of increased enhancement within the lateral RIGHT lobe of liver with a central small 8 mm enhancing nodule, previously 10 mm. No additional upper abdominal findings. Minimal chronic pericardial effusion. BILATERAL breast prostheses noted. RIGHT lower lobe mass identified posterior to the RIGHT hilum, 3.2 x 2.4 x 2.9 cm, increased since 05/10/2015 when this measured 2.7 x 2.1 x 2.5 cm. Subtotal occlusion of the bronchus intermedius by the mass. Radiation fibrosis changes  RIGHT upper lobe medially with areas of saccular bronchiectasis. This volume loss in RIGHT hemi thorax with mild mediastinal shift to the RIGHT. No additional pulmonary mass, nodule, infiltrate, or pneumothorax. Mild chronic pleural thickening and/or fluid in the RIGHT hemi thorax appears little changed. Subpleural focus of scarring antral lateral LEFT upper lobe image 13 stable. No acute osseous findings IMPRESSION: Mild increase in size of RIGHT lower lobe mass with marked narrowing and subtotal occlusion of the bronchus intermedius. Persistent radiation fibrosis changes RIGHT upper lobe volume loss. Chronic pleural thickening or minimal pleural fluid in the RIGHT hemi thorax. Persistent visualization of a small hypervascular focus in the RIGHT lobe of the liver, slightly smaller than on the previous exam. No new intra thoracic mass or adenopathy. Electronically Signed   By: Lavonia Dana M.D.   On: 08/14/2015  15:24    ASSESSMENT AND PLAN: This is a very pleasant 70 years old white female with history of stage IIIA non-small cell lung cancer status post a course of concurrent chemoradiation with weekly carboplatin and paclitaxel with significant improvement. She has been observation since November 2015. She was found to have disease progression on restaging his scan. The patient was started on treatment with Tecentriq Huey Bienenstock) status post 4 cycles and tolerated it fairly well. She will come back for follow-up visit in 3 weeks for reevaluation before starting cycle #6 after repeating CT scan of the chest for restaging of her disease. For the hypothyroidism,  her TSH is better today. the patient will continue her treatment with levothyroxine as prescribed by her primary care physician. She was advised to call immediately if she has any concerning symptoms in the interval. The patient voices understanding of current disease status and treatment options and is in agreement with the current care plan.  All questions were answered. The patient knows to call the clinic with any problems, questions or concerns. We can certainly see the patient much sooner if necessary.  Disclaimer: This note was dictated with voice recognition software. Similar sounding words can inadvertently be transcribed and may not be corrected upon review.

## 2015-09-06 NOTE — Patient Instructions (Signed)
[facility] Discharge Instructions for Patients Receiving Chemotherapy  Today you received the following chemotherapy agents: Tecentriq  To help prevent nausea and vomiting after your treatment, we encourage you to take your nausea medication as directed.    If you develop nausea and vomiting that is not controlled by your nausea medication, call the clinic.   BELOW ARE SYMPTOMS THAT SHOULD BE REPORTED IMMEDIATELY:  *FEVER GREATER THAN 100.5 F  *CHILLS WITH OR WITHOUT FEVER  NAUSEA AND VOMITING THAT IS NOT CONTROLLED WITH YOUR NAUSEA MEDICATION  *UNUSUAL SHORTNESS OF BREATH  *UNUSUAL BRUISING OR BLEEDING  TENDERNESS IN MOUTH AND THROAT WITH OR WITHOUT PRESENCE OF ULCERS  *URINARY PROBLEMS  *BOWEL PROBLEMS  UNUSUAL RASH Items with * indicate a potential emergency and should be followed up as soon as possible.  Feel free to call the clinic you have any questions or concerns. The clinic phone number is [phone].  Please show the CHEMO ALERT CARD at check-in to the Emergency Department and triage nurse.   

## 2015-09-10 ENCOUNTER — Other Ambulatory Visit: Payer: Self-pay | Admitting: Internal Medicine

## 2015-09-10 ENCOUNTER — Telehealth: Payer: Self-pay | Admitting: Medical Oncology

## 2015-09-10 ENCOUNTER — Telehealth: Payer: Self-pay | Admitting: Internal Medicine

## 2015-09-10 DIAGNOSIS — C3491 Malignant neoplasm of unspecified part of right bronchus or lung: Secondary | ICD-10-CM

## 2015-09-10 NOTE — Telephone Encounter (Signed)
Left message for patient to inform her of CT appointment change. Appointment need to be before f/u visit with Dr Julien Nordmann. New appointment is Feb 24 @ 945 am. Advised patient to call office if there is an issue with date/time.

## 2015-09-10 NOTE — Telephone Encounter (Signed)
Requests CT scan to be done at [facility]. I told her Julien Nordmann wants it done before cycle 6

## 2015-09-11 ENCOUNTER — Encounter (HOSPITAL_COMMUNITY)
Admission: RE | Admit: 2015-09-11 | Discharge: 2015-09-11 | Disposition: A | Discharge status: Home / Self Care | Payer: Medicare Other | Source: Ambulatory Visit | Attending: Internal Medicine | Admitting: Internal Medicine

## 2015-09-11 ENCOUNTER — Telehealth: Payer: Self-pay | Admitting: Medical Oncology

## 2015-09-11 DIAGNOSIS — C3490 Malignant neoplasm of unspecified part of unspecified bronchus or lung: Secondary | ICD-10-CM | POA: Diagnosis not present

## 2015-09-11 NOTE — Telephone Encounter (Signed)
I called radiology to cancel scan at [facility]

## 2015-09-11 NOTE — Telephone Encounter (Signed)
-----   Message from Curt Bears, MD sent at 09/10/2015  8:59 PM EST ----- Regarding: RE: requests ct at [facility] order is done. I was unable to cancel the previous one. ----- Message -----    From: Ardeen Garland, RN    Sent: 09/10/2015   2:53 PM      To: Curt Bears, MD Subject: requests ct at [facility]                      Can you change location of  CT  For 09/25/15 to  [place]?

## 2015-09-13 ENCOUNTER — Encounter (HOSPITAL_COMMUNITY)
Admission: RE | Admit: 2015-09-13 | Discharge: 2015-09-13 | Disposition: A | Discharge status: Home / Self Care | Payer: Medicare Other | Source: Ambulatory Visit | Attending: Internal Medicine | Admitting: Internal Medicine

## 2015-09-13 DIAGNOSIS — C3490 Malignant neoplasm of unspecified part of unspecified bronchus or lung: Secondary | ICD-10-CM | POA: Diagnosis not present

## 2015-09-18 ENCOUNTER — Encounter (HOSPITAL_COMMUNITY): Payer: Medicare Other

## 2015-09-18 ENCOUNTER — Telehealth: Payer: Self-pay | Admitting: *Deleted

## 2015-09-18 NOTE — Telephone Encounter (Signed)
"  I'm calling to ensure my CT chest scan has been rescheduled at [facility] [phone] to schedule.  CT chest scheduled 09-25-2015 arrive at 12:45 for 1:00 pm scan.

## 2015-09-20 ENCOUNTER — Encounter (HOSPITAL_COMMUNITY)
Admission: RE | Admit: 2015-09-20 | Discharge: 2015-09-20 | Disposition: A | Discharge status: Home / Self Care | Payer: Medicare Other | Source: Ambulatory Visit | Attending: Internal Medicine | Admitting: Internal Medicine

## 2015-09-20 DIAGNOSIS — C3490 Malignant neoplasm of unspecified part of unspecified bronchus or lung: Secondary | ICD-10-CM | POA: Diagnosis not present

## 2015-09-21 ENCOUNTER — Ambulatory Visit (HOSPITAL_COMMUNITY): Payer: Medicare Other

## 2015-09-25 ENCOUNTER — Ambulatory Visit (HOSPITAL_COMMUNITY)
Admission: RE | Admit: 2015-09-25 | Discharge: 2015-09-25 | Disposition: A | Discharge status: Home / Self Care | Payer: Medicare Other | Source: Ambulatory Visit | Attending: Internal Medicine | Admitting: Internal Medicine

## 2015-09-25 ENCOUNTER — Encounter (HOSPITAL_COMMUNITY)
Admission: RE | Admit: 2015-09-25 | Discharge: 2015-09-25 | Disposition: A | Discharge status: Home / Self Care | Payer: Medicare Other | Source: Ambulatory Visit | Attending: Internal Medicine | Admitting: Internal Medicine

## 2015-09-25 DIAGNOSIS — J90 Pleural effusion, not elsewhere classified: Secondary | ICD-10-CM | POA: Insufficient documentation

## 2015-09-25 DIAGNOSIS — C3491 Malignant neoplasm of unspecified part of right bronchus or lung: Secondary | ICD-10-CM | POA: Insufficient documentation

## 2015-09-25 DIAGNOSIS — I2510 Atherosclerotic heart disease of native coronary artery without angina pectoris: Secondary | ICD-10-CM | POA: Diagnosis not present

## 2015-09-25 DIAGNOSIS — R918 Other nonspecific abnormal finding of lung field: Secondary | ICD-10-CM | POA: Insufficient documentation

## 2015-09-25 DIAGNOSIS — I313 Pericardial effusion (noninflammatory): Secondary | ICD-10-CM | POA: Insufficient documentation

## 2015-09-25 DIAGNOSIS — C3490 Malignant neoplasm of unspecified part of unspecified bronchus or lung: Secondary | ICD-10-CM | POA: Diagnosis not present

## 2015-09-25 MED ORDER — IOHEXOL 300 MG/ML  SOLN
75.0000 mL | Freq: Once | INTRAMUSCULAR | Status: AC | PRN
  Administered 2015-09-25: 75 mL via INTRAVENOUS

## 2015-09-27 ENCOUNTER — Telehealth: Payer: Self-pay | Admitting: Internal Medicine

## 2015-09-27 ENCOUNTER — Ambulatory Visit (HOSPITAL_BASED_OUTPATIENT_CLINIC_OR_DEPARTMENT_OTHER): Payer: Medicare Other | Admitting: Internal Medicine

## 2015-09-27 ENCOUNTER — Other Ambulatory Visit (HOSPITAL_BASED_OUTPATIENT_CLINIC_OR_DEPARTMENT_OTHER): Payer: Medicare Other

## 2015-09-27 ENCOUNTER — Encounter: Payer: Self-pay | Admitting: Internal Medicine

## 2015-09-27 ENCOUNTER — Encounter (HOSPITAL_COMMUNITY): Payer: Medicare Other

## 2015-09-27 ENCOUNTER — Ambulatory Visit: Payer: Medicare Other

## 2015-09-27 VITALS — BP 140/84 | HR 106 | Temp 98.4°F | Resp 18 | Ht 68.0 in | Wt 154.5 lb

## 2015-09-27 DIAGNOSIS — E032 Hypothyroidism due to medicaments and other exogenous substances: Secondary | ICD-10-CM

## 2015-09-27 DIAGNOSIS — C3431 Malignant neoplasm of lower lobe, right bronchus or lung: Secondary | ICD-10-CM | POA: Diagnosis not present

## 2015-09-27 DIAGNOSIS — J181 Lobar pneumonia, unspecified organism: Secondary | ICD-10-CM

## 2015-09-27 DIAGNOSIS — R05 Cough: Secondary | ICD-10-CM | POA: Diagnosis not present

## 2015-09-27 DIAGNOSIS — Z5111 Encounter for antineoplastic chemotherapy: Secondary | ICD-10-CM

## 2015-09-27 DIAGNOSIS — Z85850 Personal history of malignant neoplasm of thyroid: Secondary | ICD-10-CM

## 2015-09-27 DIAGNOSIS — Z5112 Encounter for antineoplastic immunotherapy: Secondary | ICD-10-CM

## 2015-09-27 DIAGNOSIS — C3432 Malignant neoplasm of lower lobe, left bronchus or lung: Secondary | ICD-10-CM

## 2015-09-27 DIAGNOSIS — E039 Hypothyroidism, unspecified: Secondary | ICD-10-CM

## 2015-09-27 DIAGNOSIS — C3491 Malignant neoplasm of unspecified part of right bronchus or lung: Secondary | ICD-10-CM

## 2015-09-27 DIAGNOSIS — R0602 Shortness of breath: Secondary | ICD-10-CM

## 2015-09-27 LAB — COMPREHENSIVE METABOLIC PANEL
ALBUMIN: 4 g/dL (ref 3.5–5.0)
ALK PHOS: 109 U/L (ref 40–150)
ALT: 12 U/L (ref 0–55)
AST: 17 U/L (ref 5–34)
Anion Gap: 10 mEq/L (ref 3–11)
BUN: 12.5 mg/dL (ref 7.0–26.0)
CALCIUM: 9.7 mg/dL (ref 8.4–10.4)
CO2: 21 mEq/L — ABNORMAL LOW (ref 22–29)
Chloride: 108 mEq/L (ref 98–109)
Creatinine: 0.8 mg/dL (ref 0.6–1.1)
EGFR: 77 mL/min/{1.73_m2} — AB (ref >90)
Glucose: 78 mg/dl (ref 70–140)
POTASSIUM: 4.2 meq/L (ref 3.5–5.1)
Sodium: 139 mEq/L (ref 136–145)
Total Bilirubin: 0.34 mg/dL (ref 0.20–1.20)
Total Protein: 7.4 g/dL (ref 6.4–8.3)

## 2015-09-27 LAB — CBC WITH DIFFERENTIAL/PLATELET
BASO%: 0.9 % (ref 0.0–2.0)
BASOS ABS: 0.1 10*3/uL (ref 0.0–0.1)
EOS ABS: 0.2 10*3/uL (ref 0.0–0.5)
EOS%: 2.5 % (ref 0.0–7.0)
HEMATOCRIT: 43.4 % (ref 34.8–46.6)
HEMOGLOBIN: 14.2 g/dL (ref 11.6–15.9)
LYMPH#: 2.4 10*3/uL (ref 0.9–3.3)
LYMPH%: 36 % (ref 14.0–49.7)
MCH: 30.1 pg (ref 25.1–34.0)
MCHC: 32.7 g/dL (ref 31.5–36.0)
MCV: 91.9 fL (ref 79.5–101.0)
MONO#: 0.7 10*3/uL (ref 0.1–0.9)
MONO%: 10.3 % (ref 0.0–14.0)
NEUT#: 3.4 10*3/uL (ref 1.5–6.5)
NEUT%: 50.3 % (ref 38.4–76.8)
PLATELETS: 292 10*3/uL (ref 145–400)
RBC: 4.72 10*6/uL (ref 3.70–5.45)
RDW: 13.1 % (ref 11.2–14.5)
WBC: 6.8 10*3/uL (ref 3.9–10.3)

## 2015-09-27 LAB — TSH: TSH: 5.556 m[IU]/L — AB (ref 0.308–3.960)

## 2015-09-27 NOTE — Progress Notes (Signed)
[place] [phone]   Fax:([phone]) [place], MD [address] [place] [address]  DIAGNOSIS: Progressive non-small cell lung cancer initially diagnosed as Stage IIIA (T3, N2, M0) non-small cell lung cancer, squamous cell carcinoma diagnosed in September of 2015 presenting with central right lower lobe lung mass with postobstructive pneumonitis and mediastinal lymph nodes, was also a suspicious small nodule in the right upper lobe.  PRIOR THERAPY:  2) Status post a course of concurrent chemoradiation with weekly carboplatin for AUC of 2 and paclitaxel 45 MG/M2, last dose was given 06/12/2014 with partial response. 2) Tecentriq (Atezolizumab) 1200 mg IV every 3 weeks. First dose 06/14/2015. Status post 5 cycles discontinued secondary to disease progression  CURRENT THERAPY: Systemic chemotherapy was carboplatin for AUC of 5 on day 1 and gemcitabine 1000 MG/M2 on days 1 and 8 every 3 weeks  INTERVAL HISTORY: [name] 70 y.o. female returns to the clinic today for follow-up visit accompanied by her husband.  She completed a course of treatment with immunotherapy with Tecentriq (Atezolizumab) status post 5 cycles and tolerated it fairly well except for fatigue. She was seen recently by Dr. Kerin Ransom at [facility] for consideration of stent placement. He was awaiting the CT scan of the chest that was performed recently before making a decision regarding her treatment.  She denied having any significant nausea or vomiting. She has no diarrhea. She denied having any significant skin rash. The patient has no chest pain but continues to have mild shortness of breath and dry cough with no hemoptysis.  She had repeat CT scan of the chest performed recently and she is here for evaluation and discussion of her scan results and treatment options.  MEDICAL HISTORY: Past Medical History  Diagnosis Date  . Thyroid  disease     hypothyroidism  . Hx of thyroid cancer   . Status post bilateral breast implants   . Consolidation lung ([place]) 03/21/14    RLL PER CXR/CT  . Hypertension     just started,not on medication  . Shortness of breath     exertion  . Hypothyroidism   . Mitral valve prolapse   . Anxiety   . Arthritis   . Fibromyalgia   . Complication of anesthesia     headaches,very agitated while waking  up  . Cancer ([place])     thyroid  . Lung cancer ([place])     2015  . Encounter for antineoplastic immunotherapy 07/05/2015  . Hypothyroidism 07/26/2015    ALLERGIES:  is allergic to niacin and related.  MEDICATIONS:  Current Outpatient Prescriptions  Medication Sig Dispense Refill  . acetaminophen (TYLENOL) 325 MG tablet Take 650 mg by mouth every 6 (six) hours as needed.    Marland Kitchen amLODipine (NORVASC) 5 MG tablet Take 5 mg by mouth daily.   4  . aspirin EC 81 MG tablet Take 162 mg by mouth at bedtime.     . beclomethasone (QVAR) 80 MCG/ACT inhaler     . clonazePAM (KLONOPIN) 0.5 MG tablet One three times a day with meals and 2 at bedtime (Patient taking differently: Take 0.5 mg by mouth as needed for anxiety. ) 75 tablet 0  . estradiol (ESTRACE) 0.5 MG tablet Take 0.5 mg by mouth daily.    Marland Kitchen FLUoxetine (PROZAC) 20 MG tablet Take by mouth.    Marland Kitchen HYDROcodone-homatropine (HYCODAN) 5-1.5 MG/5ML syrup Take 5 mLs by mouth every  6 (six) hours as needed for cough. 473 mL 0  . ibuprofen (ADVIL,MOTRIN) 100 MG tablet Take 200 mg by mouth every 6 (six) hours as needed for fever.    . levothyroxine (SYNTHROID, LEVOTHROID) 125 MCG tablet Take 137 mcg by mouth daily before breakfast.     . medroxyPROGESTERone (PROVERA) 2.5 MG tablet Take 2.5 mg by mouth daily.    . pantoprazole (PROTONIX) 40 MG tablet Take by mouth.    . traMADol (ULTRAM) 50 MG tablet      No current facility-administered medications for this visit.    SURGICAL HISTORY:  Past Surgical History  Procedure Laterality Date  . Thyroidectomy      . Cataract extraction Right   . Breast enhancement surgery    . Retinal detachment surgery    . Eye surgery    . Pars plana vitrectomy w/ repair of macular hole    . Back surgery      times 2  . Tonsillectomy    . Cervical cone biopsy    . Video bronchoscopy with endobronchial ultrasound N/A 04/07/2014    Procedure: VIDEO BRONCHOSCOPY WITH ENDOBRONCHIAL ULTRASOUND;  Surgeon: Melrose Nakayama, MD;  Location: [facility];  Service: Thoracic;  Laterality: N/A;    REVIEW OF SYSTEMS:  Constitutional: positive for fatigue Eyes: negative Ears, nose, mouth, throat, and face: negative Respiratory: positive for dyspnea on exertion Cardiovascular: negative Gastrointestinal: negative Genitourinary:negative Integument/breast: negative Hematologic/lymphatic: negative Musculoskeletal:negative Neurological: negative Behavioral/Psych: negative Endocrine: negative Allergic/Immunologic: negative   PHYSICAL EXAMINATION: General appearance: alert, cooperative, fatigued and no distress Head: Normocephalic, without obvious abnormality, atraumatic Neck: no adenopathy, no JVD, supple, symmetrical, trachea midline and thyroid not enlarged, symmetric, no tenderness/mass/nodules Lymph nodes: Cervical, supraclavicular, and axillary nodes normal. Resp: wheezes bilaterally Back: symmetric, no curvature. ROM normal. No CVA tenderness. Cardio: regular rate and rhythm, S1, S2 normal, no murmur, click, rub or gallop GI: soft, non-tender; bowel sounds normal; no masses,  no organomegaly Extremities: extremities normal, atraumatic, no cyanosis or edema Neurologic: Alert and oriented X 3, normal strength and tone. Normal symmetric reflexes. Normal coordination and gait  ECOG PERFORMANCE STATUS: 1 - Symptomatic but completely ambulatory  Blood pressure 140/84, pulse 106, temperature 98.4 F (36.9 C), temperature source Oral, resp. rate 18, height '5\' 8"'$  (1.727 m), weight 154 lb 8 oz (70.081 kg), SpO2 98  %.  LABORATORY DATA: Lab Results  Component Value Date   WBC 6.8 09/27/2015   HGB 14.2 09/27/2015   HCT 43.4 09/27/2015   MCV 91.9 09/27/2015   PLT 292 09/27/2015      Chemistry      Component Value Date/Time   NA 140 09/06/2015 0918   NA 138 07/28/2014 1611   K 4.2 09/06/2015 0918   K 3.9 07/28/2014 1611   CL 105 07/28/2014 1611   CO2 18* 09/06/2015 0918   CO2 26 07/28/2014 1611   BUN 13.8 09/06/2015 0918   BUN 12 07/28/2014 1611   CREATININE 0.8 09/06/2015 0918   CREATININE 0.53 07/28/2014 1611      Component Value Date/Time   CALCIUM 9.6 09/06/2015 0918   CALCIUM 9.3 07/28/2014 1611   ALKPHOS 103 09/06/2015 0918   ALKPHOS 197* 04/05/2014 1336   AST 17 09/06/2015 0918   AST 18 04/05/2014 1336   ALT 17 09/06/2015 0918   ALT 33 04/05/2014 1336   BILITOT 0.43 09/06/2015 0918   BILITOT 0.3 04/05/2014 1336       RADIOGRAPHIC STUDIES: Ct Chest W Contrast  09/25/2015  CLINICAL DATA:  70 year old female with history of non-small-cell right-sided lung cancer status post chemotherapy and radiation therapy, as well as immunotherapy. EXAM: CT CHEST WITH CONTRAST TECHNIQUE: Multidetector CT imaging of the chest was performed during intravenous contrast administration. CONTRAST:  61m OMNIPAQUE IOHEXOL 300 MG/ML  SOLN COMPARISON:  Chest CT 08/14/2015. FINDINGS: Mediastinum/Lymph Nodes: Heart size is normal. Small amount of pericardial fluid and/or thickening, similar to the prior study, and unlikely to be of any hemodynamic significance at this time. No associated pericardial calcification. There is atherosclerosis of the thoracic aorta, the great vessels of the mediastinum and the coronary arteries, including calcified atherosclerotic plaque in the left main, left anterior descending and right coronary arteries. Prominent soft tissue in the right hilar region, inseparable from the right lower lobe mass, potentially lymphadenopathy. No other mediastinal or left hilar lymphadenopathy  is noted. Esophagus is unremarkable in appearance. No axillary lymphadenopathy. Lungs/Pleura: Again noted is a mass in the medial aspect of the right lower lobe, which currently measures 3.8 x 3.0 cm (image 30 of series 2), which slightly larger than the prior examination (3.2 x 2.5 cm on 08/14/2015). This mass completely occludes the bronchus intermedius, resulting in completed occlusion of right middle lobe bronchi and chronic atelectasis of the right middle lobe. The right lower lobe bronchi also appear occluded, however, there distally patent, and there is good aeration throughout much of the right lower lobe. There is again a band-like area of paramediastinal radiation fibrosis in the right upper lobe and superior segment of the right lower lobe, similar to prior examination. Scattered areas of mild septal thickening and thickening of the peribronchovascular interstitium throughout the basal segments of the right lower lobe appear very similar to the prior study and are favored to reflect postobstructive changes (rather than lymphangitic spread of tumor), bold warrant continued attention on future followup examinations. Small partially loculated right-sided pleural effusion is again noted, only slightly larger than the prior examination. 5 mm peripheral nodule in the left upper lobe (image 15 of series 3) is unchanged compared to the prior study and nonspecific. Upper Abdomen: Hypervascular area in the central liver, predominantly involving segments 5 and 8, remains amorphous, and is strongly favored to represent a perfusion anomaly. Atherosclerosis. Musculoskeletal/Soft Tissues: Bilateral breast implants incidentally noted. There are no aggressive appearing lytic or blastic lesions noted in the visualized portions of the skeleton. IMPRESSION: 1. Slight interval increase in size of previously noted right lower lobe mass, which currently measures 3.0 x 3.8 cm. While this could represent some mild progression of  disease, this may alternatively represent "pseudo progression" in the setting of recent immunotherapy. Continued attention on future followup examinations is recommended. 2. Small right pleural effusion is only slightly larger than the prior examination. 3. Mild postobstructive changes throughout the basal segments of the right lower lobe appear similar to the prior examination. Complete chronic atelectasis of the right middle lobe has worsened compared to the prior examination. 4. Small pericardial effusion is unchanged, unlikely to be of any hemodynamic significance. 5. Atherosclerosis, including left main and 2 vessel coronary artery disease. 6. Additional incidental findings, as above. Electronically Signed   By: DVinnie LangtonM.D.   On: 09/25/2015 15:20    ASSESSMENT AND PLAN: This is a very pleasant 70years old white female with history of stage IIIA non-small cell lung cancer status post a course of concurrent chemoradiation with weekly carboplatin and paclitaxel with significant improvement. She has been observation since November 2015. She was found  to have disease progression on restaging his scan. The patient was started on treatment with Tecentriq Huey Bienenstock) status post 5 cycles and tolerated it fairly well. Unfortunately the recent CT scan of the chest showed further increase in the size of the right lower lobe lung mass. I discussed the scan results with the patient and her husband. This represent a second progression and most likely true progression on the immunotherapy. I recommended for the patient to discontinue her treatment with Tecentriq (Atezolizumab) at this point. I discussed with her other treatment options including systemic chemotherapy with carboplatin for AUC of 5 on day 1 and gemcitabine 1000 MG/M2 on days 1 and 8 every 3 weeks. I discussed with the patient adverse effect of this treatment including but not limited to alopecia, myelosuppression, nausea and vomiting,  peripheral neuropathy, liver or renal dysfunction. She is expected to start the first cycle of this treatment on 10/11/2015. For the hypothyroidism,  her TSH is better today. the patient will continue her treatment with levothyroxine as prescribed by her primary care physician. She was advised to call immediately if she has any concerning symptoms in the interval. The patient voices understanding of current disease status and treatment options and is in agreement with the current care plan.  All questions were answered. The patient knows to call the clinic with any problems, questions or concerns. We can certainly see the patient much sooner if necessary.  Disclaimer: This note was dictated with voice recognition software. Similar sounding words can inadvertently be transcribed and may not be corrected upon review.

## 2015-09-27 NOTE — Telephone Encounter (Signed)
Gave and printed appt sched and avs for pt for March thru may

## 2015-09-28 ENCOUNTER — Ambulatory Visit (HOSPITAL_COMMUNITY): Payer: Medicare Other

## 2015-10-02 ENCOUNTER — Encounter (HOSPITAL_COMMUNITY)
Admission: RE | Admit: 2015-10-02 | Discharge: 2015-10-02 | Disposition: A | Discharge status: Home / Self Care | Payer: Medicare Other | Source: Ambulatory Visit | Attending: Internal Medicine | Admitting: Internal Medicine

## 2015-10-02 DIAGNOSIS — C3490 Malignant neoplasm of unspecified part of unspecified bronchus or lung: Secondary | ICD-10-CM | POA: Diagnosis present

## 2015-10-02 DIAGNOSIS — J984 Other disorders of lung: Secondary | ICD-10-CM | POA: Insufficient documentation

## 2015-10-03 ENCOUNTER — Encounter: Payer: Self-pay | Admitting: Cardiovascular Disease

## 2015-10-03 ENCOUNTER — Telehealth: Payer: Self-pay | Admitting: *Deleted

## 2015-10-03 ENCOUNTER — Other Ambulatory Visit: Payer: Self-pay | Admitting: Internal Medicine

## 2015-10-03 ENCOUNTER — Ambulatory Visit (INDEPENDENT_AMBULATORY_CARE_PROVIDER_SITE_OTHER): Payer: Medicare Other | Admitting: Cardiovascular Disease

## 2015-10-03 VITALS — BP 122/64 | HR 100 | Ht 68.0 in | Wt 153.0 lb

## 2015-10-03 DIAGNOSIS — I10 Essential (primary) hypertension: Secondary | ICD-10-CM | POA: Diagnosis not present

## 2015-10-03 DIAGNOSIS — Z8249 Family history of ischemic heart disease and other diseases of the circulatory system: Secondary | ICD-10-CM

## 2015-10-03 DIAGNOSIS — I2510 Atherosclerotic heart disease of native coronary artery without angina pectoris: Secondary | ICD-10-CM | POA: Diagnosis not present

## 2015-10-03 NOTE — Telephone Encounter (Signed)
pof to scheduling to cancel 3/16 lab/infusion appt and r/s MD appt from 3/16 to 3/23

## 2015-10-03 NOTE — Patient Instructions (Signed)
Medication Instructions:  Your physician recommends that you continue on your current medications as directed. Please refer to the Current Medication list given to you today.   Labwork: NONE  Testing/Procedures: Your physician has requested that you have a lexiscan myoview. For further information please visit [email or web address]. Please follow instruction sheet, as given.    Follow-Up: Your physician recommends that you schedule a follow-up appointment in: 6 WEEKS    Any Other Special Instructions Will Be Listed Below (If Applicable).     If you need a refill on your cardiac medications before your next appointment, please call your pharmacy.

## 2015-10-03 NOTE — Telephone Encounter (Signed)
Pt called to change start date of chemotherapy from 3/16 to following week. Carbo/Gemzar Pt will be having a procedure by Pulmonologist on 3/16 . Message to MD for review

## 2015-10-03 NOTE — Progress Notes (Signed)
Patient ID: [name], female   DOB: Nov 11, 1945, 70 y.o.   MRN: [id number]       CARDIOLOGY CONSULT NOTE  Patient ID: [name] MRN: [id number] DOB/AGE: 1945/09/26 70 y.o.  Admit date: (Not on file) Primary Physician Asencion Noble, MD  Reason for Consultation: Abnormal CT scan  HPI: The patient is a 70 yr old woman with a past medical history significant for hypertension, hyperlipidemia, hypothyroidism, and lung cancer.  She has had issues with pneumonitis and is scheduled to undergo a procedure at [facility] next week to alleviate some of her associated shortness of breath. A chest CT performed on 09/25/15 demonstrated a small amount of pericardial fluid with no associated pericardial calcification. It also showed atherosclerosis of the thoracic aorta, the great vessels of the mediastinum and the coronary arteries, including calcified athero-squatted plaque in the left main, left anterior descending and right coronary arteries. She presents for further evaluation. Labs on 08/16/15 showed Hgb 14, creatinine 0.7.  She denies exertional chest tightness and discomfort. She has exertional dyspnea due to her lung cancer and associated treatments. When she participates in pulmonary rehabilitation , she is able to walk for 15 minutes on the treadmill at 2.5 miles per hour without difficulty.  She has noticed some heart rate fluctuations and has thyroid disease and takes levothyroxine.   Fam: Father died of a myocardial infarction at age 29 but was also an alcoholic. Grandfather had a heart attack at roughly the same age. Sister had a myocardial infarction at age 71 and is a smoker.  Allergies  Allergen Reactions  . Niacin And Related Rash    Current Outpatient Prescriptions  Medication Sig Dispense Refill  . acetaminophen (TYLENOL) 325 MG tablet Take 650 mg by mouth every 6 (six) hours as needed.    Marland Kitchen amLODipine (NORVASC) 5 MG tablet Take 5 mg by mouth daily.   4  . aspirin EC 81 MG tablet  Take 162 mg by mouth at bedtime.     . clonazePAM (KLONOPIN) 0.5 MG tablet One three times a day with meals and 2 at bedtime (Patient taking differently: Take 0.5 mg by mouth as needed for anxiety. ) 75 tablet 0  . estradiol (ESTRACE) 0.5 MG tablet Take 0.5 mg by mouth daily.    Marland Kitchen FLUoxetine (PROZAC) 20 MG tablet Take by mouth.    Marland Kitchen HYDROcodone-homatropine (HYCODAN) 5-1.5 MG/5ML syrup Take 5 mLs by mouth every 6 (six) hours as needed for cough. 473 mL 0  . ibuprofen (ADVIL,MOTRIN) 100 MG tablet Take 200 mg by mouth every 6 (six) hours as needed for fever.    . levothyroxine (SYNTHROID, LEVOTHROID) 125 MCG tablet Take 137 mcg by mouth daily before breakfast.     . medroxyPROGESTERone (PROVERA) 2.5 MG tablet Take 2.5 mg by mouth daily.    . traMADol (ULTRAM) 50 MG tablet Reported on 10/03/2015     No current facility-administered medications for this visit.    Past Medical History  Diagnosis Date  . Thyroid disease     hypothyroidism  . Hx of thyroid cancer   . Status post bilateral breast implants   . Consolidation lung ([place]) 03/21/14    RLL PER CXR/CT  . Hypertension     just started,not on medication  . Shortness of breath     exertion  . Hypothyroidism   . Mitral valve prolapse   . Anxiety   . Arthritis   . Fibromyalgia   . Complication of anesthesia  headaches,very agitated while waking  up  . Cancer ([place])     thyroid  . Lung cancer ([place])     2015  . Encounter for antineoplastic immunotherapy 07/05/2015  . Hypothyroidism 07/26/2015    Past Surgical History  Procedure Laterality Date  . Thyroidectomy    . Cataract extraction Right   . Breast enhancement surgery    . Retinal detachment surgery    . Eye surgery    . Pars plana vitrectomy w/ repair of macular hole    . Back surgery      times 2  . Tonsillectomy    . Cervical cone biopsy    . Video bronchoscopy with endobronchial ultrasound N/A 04/07/2014    Procedure: VIDEO BRONCHOSCOPY WITH ENDOBRONCHIAL ULTRASOUND;   Surgeon: Melrose Nakayama, MD;  Location: [place] [facility];  Service: Thoracic;  Laterality: N/A;    Social History   Social History  . Marital Status: Married    Spouse Name: N/A  . Number of Children: N/A  . Years of Education: N/A   Occupational History  . Not on file.   Social History Main Topics  . Smoking status: Former Smoker -- 0.50 packs/day for 49 years    Types: Cigarettes    Quit date: 02/25/2014  . Smokeless tobacco: Never Used  . Alcohol Use: 0.0 oz/week    0 Standard drinks or equivalent per week     Comment: SOCIAL  . Drug Use: Yes    Special: Marijuana     Comment: 1970's  . Sexual Activity: Not on file   Other Topics Concern  . Not on file   Social History Narrative       Prior to Admission medications   Medication Sig Start Date End Date Taking? Authorizing Provider  acetaminophen (TYLENOL) 325 MG tablet Take 650 mg by mouth every 6 (six) hours as needed.    Historical Provider, MD  amLODipine (NORVASC) 5 MG tablet Take 5 mg by mouth daily.  04/21/14   Historical Provider, MD  aspirin EC 81 MG tablet Take 162 mg by mouth at bedtime.     Historical Provider, MD  beclomethasone (QVAR) 80 MCG/ACT inhaler  10/30/14   Historical Provider, MD  clonazePAM (KLONOPIN) 0.5 MG tablet One three times a day with meals and 2 at bedtime Patient taking differently: Take 0.5 mg by mouth as needed for anxiety.  09/25/14   Tanda Rockers, MD  estradiol (ESTRACE) 0.5 MG tablet Take 0.5 mg by mouth daily.    Historical Provider, MD  FLUoxetine (PROZAC) 20 MG tablet Take by mouth.    Historical Provider, MD  HYDROcodone-homatropine (HYCODAN) 5-1.5 MG/5ML syrup Take 5 mLs by mouth every 6 (six) hours as needed for cough. 06/07/15   Curt Bears, MD  ibuprofen (ADVIL,MOTRIN) 100 MG tablet Take 200 mg by mouth every 6 (six) hours as needed for fever.    Historical Provider, MD  levothyroxine (SYNTHROID, LEVOTHROID) 125 MCG tablet Take 137 mcg by mouth daily before breakfast.      Historical Provider, MD  medroxyPROGESTERone (PROVERA) 2.5 MG tablet Take 2.5 mg by mouth daily.    Historical Provider, MD  pantoprazole (PROTONIX) 40 MG tablet Take by mouth. 08/14/14   Historical Provider, MD  traMADol Veatrice Bourbon) 50 MG tablet  10/30/14   Historical Provider, MD     Review of systems complete and found to be negative unless listed above in HPI     Physical exam Blood pressure 122/64, pulse 100, height '5\' 8"'$  (  1.727 m), weight 153 lb (69.4 kg), SpO2 95 %. General: NAD Neck: No JVD, no thyromegaly or thyroid nodule.  Lungs: Diminished at right base but otherwise clear, no rales. CV: Nondisplaced PMI. Regular rate and rhythm, normal S1/S2, no S3/S4, no murmur.  No peripheral edema.  No carotid bruit.    Abdomen: Soft, nontender, no distention.  Skin: Intact without lesions or rashes.  Neurologic: Alert and oriented x 3.  Psych: Normal affect. HEENT: Normal.   ECG: Most recent ECG reviewed.  Labs:   Lab Results  Component Value Date   WBC 6.8 09/27/2015   HGB 14.2 09/27/2015   HCT 43.4 09/27/2015   MCV 91.9 09/27/2015   PLT 292 09/27/2015     Recent Labs Lab 09/27/15 1002  NA 139  K 4.2  CO2 21*  BUN 12.5  CREATININE 0.8  CALCIUM 9.7  PROT 7.4  BILITOT 0.34  ALKPHOS 109  ALT 12  AST 17  GLUCOSE 78   Lab Results  Component Value Date   TROPONINI <0.03 07/28/2014   No results found for: CHOL No results found for: HDL No results found for: LDLCALC No results found for: TRIG No results found for: CHOLHDL No results found for: LDLDIRECT       Studies: No results found.  ASSESSMENT AND PLAN:  1. Coronary artery calcifications on CT: Details reviewed above. Will proceed with a Lexiscan Cardiolite stress test to evaluate for hemodynamically significant coronary artery disease. I recommend continued use of aspirin 81 mg daily.  2. Essential HTN: Controlled on amlodiopine 5 mg. No changes.  Dispo: f/u 6 weeks.   Signed: Kate Sable,  M.D., F.A.C.C.  10/03/2015, 1:45 PM

## 2015-10-03 NOTE — Telephone Encounter (Signed)
Done

## 2015-10-04 ENCOUNTER — Telehealth: Payer: Self-pay | Admitting: Internal Medicine

## 2015-10-04 ENCOUNTER — Encounter (HOSPITAL_COMMUNITY)
Admission: RE | Admit: 2015-10-04 | Discharge: 2015-10-04 | Disposition: A | Discharge status: Home / Self Care | Payer: Medicare Other | Source: Ambulatory Visit | Attending: Internal Medicine | Admitting: Internal Medicine

## 2015-10-04 DIAGNOSIS — C3490 Malignant neoplasm of unspecified part of unspecified bronchus or lung: Secondary | ICD-10-CM | POA: Diagnosis not present

## 2015-10-04 NOTE — Telephone Encounter (Signed)
lvm for pt regarding 3.16 appts cx per pof

## 2015-10-09 ENCOUNTER — Encounter (HOSPITAL_COMMUNITY): Payer: Medicare Other

## 2015-10-11 ENCOUNTER — Ambulatory Visit: Payer: Medicare Other

## 2015-10-11 ENCOUNTER — Other Ambulatory Visit: Payer: Medicare Other

## 2015-10-11 ENCOUNTER — Ambulatory Visit: Payer: Medicare Other | Admitting: Nurse Practitioner

## 2015-10-15 ENCOUNTER — Encounter (HOSPITAL_COMMUNITY): Payer: Medicare Other

## 2015-10-15 ENCOUNTER — Inpatient Hospital Stay (HOSPITAL_COMMUNITY): Admission: RE | Admit: 2015-10-15 | Payer: Medicare Other | Source: Ambulatory Visit

## 2015-10-18 ENCOUNTER — Ambulatory Visit (HOSPITAL_BASED_OUTPATIENT_CLINIC_OR_DEPARTMENT_OTHER): Payer: Medicare Other | Admitting: Nurse Practitioner

## 2015-10-18 ENCOUNTER — Ambulatory Visit: Payer: Medicare Other | Admitting: Nurse Practitioner

## 2015-10-18 ENCOUNTER — Other Ambulatory Visit: Payer: Medicare Other

## 2015-10-18 ENCOUNTER — Telehealth: Payer: Self-pay | Admitting: Internal Medicine

## 2015-10-18 ENCOUNTER — Other Ambulatory Visit: Payer: Self-pay | Admitting: Internal Medicine

## 2015-10-18 ENCOUNTER — Other Ambulatory Visit (HOSPITAL_BASED_OUTPATIENT_CLINIC_OR_DEPARTMENT_OTHER): Payer: Medicare Other

## 2015-10-18 ENCOUNTER — Ambulatory Visit: Payer: Medicare Other | Admitting: Internal Medicine

## 2015-10-18 ENCOUNTER — Ambulatory Visit (HOSPITAL_BASED_OUTPATIENT_CLINIC_OR_DEPARTMENT_OTHER): Payer: Medicare Other

## 2015-10-18 VITALS — BP 148/80 | HR 108 | Temp 98.2°F | Resp 20 | Ht 68.0 in | Wt 156.2 lb

## 2015-10-18 DIAGNOSIS — E039 Hypothyroidism, unspecified: Secondary | ICD-10-CM

## 2015-10-18 DIAGNOSIS — Z85850 Personal history of malignant neoplasm of thyroid: Secondary | ICD-10-CM

## 2015-10-18 DIAGNOSIS — C3491 Malignant neoplasm of unspecified part of right bronchus or lung: Secondary | ICD-10-CM

## 2015-10-18 DIAGNOSIS — Z5111 Encounter for antineoplastic chemotherapy: Secondary | ICD-10-CM | POA: Diagnosis not present

## 2015-10-18 LAB — CBC WITH DIFFERENTIAL/PLATELET
BASO%: 1.8 % (ref 0.0–2.0)
Basophils Absolute: 0.1 10*3/uL (ref 0.0–0.1)
EOS ABS: 0.2 10*3/uL (ref 0.0–0.5)
EOS%: 2.3 % (ref 0.0–7.0)
HCT: 41.6 % (ref 34.8–46.6)
HEMOGLOBIN: 13.8 g/dL (ref 11.6–15.9)
LYMPH%: 22.4 % (ref 14.0–49.7)
MCH: 30.2 pg (ref 25.1–34.0)
MCHC: 33.1 g/dL (ref 31.5–36.0)
MCV: 91.1 fL (ref 79.5–101.0)
MONO#: 0.7 10*3/uL (ref 0.1–0.9)
MONO%: 8.5 % (ref 0.0–14.0)
NEUT#: 5.4 10*3/uL (ref 1.5–6.5)
NEUT%: 65 % (ref 38.4–76.8)
Platelets: 332 10*3/uL (ref 145–400)
RBC: 4.56 10*6/uL (ref 3.70–5.45)
RDW: 13.6 % (ref 11.2–14.5)
WBC: 8.3 10*3/uL (ref 3.9–10.3)
lymph#: 1.8 10*3/uL (ref 0.9–3.3)

## 2015-10-18 LAB — COMPREHENSIVE METABOLIC PANEL
ALBUMIN: 3.6 g/dL (ref 3.5–5.0)
ALK PHOS: 125 U/L (ref 40–150)
ALT: 44 U/L (ref 0–55)
AST: 34 U/L (ref 5–34)
Anion Gap: 10 mEq/L (ref 3–11)
BUN: 15.4 mg/dL (ref 7.0–26.0)
CALCIUM: 9.5 mg/dL (ref 8.4–10.4)
CO2: 21 mEq/L — ABNORMAL LOW (ref 22–29)
Chloride: 109 mEq/L (ref 98–109)
Creatinine: 0.7 mg/dL (ref 0.6–1.1)
EGFR: 86 mL/min/{1.73_m2} — AB (ref >90)
GLUCOSE: 89 mg/dL (ref 70–140)
POTASSIUM: 4 meq/L (ref 3.5–5.1)
SODIUM: 140 meq/L (ref 136–145)
Total Bilirubin: 0.44 mg/dL (ref 0.20–1.20)
Total Protein: 7.2 g/dL (ref 6.4–8.3)

## 2015-10-18 LAB — TSH: TSH: 7.326 m[IU]/L — AB (ref 0.308–3.960)

## 2015-10-18 MED ORDER — MORPHINE SULFATE 15 MG PO TABS: 15.0000 mg | ORAL_TABLET | Freq: Four times a day (QID) | ORAL | Status: AC | PRN

## 2015-10-18 MED ORDER — PROCHLORPERAZINE MALEATE 10 MG PO TABS: 10.0000 mg | ORAL_TABLET | Freq: Four times a day (QID) | ORAL | Status: AC | PRN

## 2015-10-18 MED ORDER — PALONOSETRON HCL INJECTION 0.25 MG/5ML
INTRAVENOUS | Status: AC
  Filled 2015-10-18: qty 5

## 2015-10-18 MED ORDER — CARBOPLATIN CHEMO INJECTION 600 MG/60ML
430.0000 mg | Freq: Once | INTRAVENOUS | Status: AC
  Administered 2015-10-18: 430 mg via INTRAVENOUS
  Filled 2015-10-18: qty 43

## 2015-10-18 MED ORDER — PALONOSETRON HCL INJECTION 0.25 MG/5ML
0.2500 mg | Freq: Once | INTRAVENOUS | Status: AC
Start: 2015-10-18 — End: 2015-10-18
  Administered 2015-10-18: 0.25 mg via INTRAVENOUS

## 2015-10-18 MED ORDER — SODIUM CHLORIDE 0.9 % IV SOLN
10.0000 mg | Freq: Once | INTRAVENOUS | Status: AC
  Administered 2015-10-18: 10 mg via INTRAVENOUS
  Filled 2015-10-18: qty 1

## 2015-10-18 MED ORDER — SODIUM CHLORIDE 0.9 % IV SOLN
Freq: Once | INTRAVENOUS | Status: AC
  Administered 2015-10-18: 12:00:00 via INTRAVENOUS

## 2015-10-18 MED ORDER — GEMCITABINE HCL CHEMO INJECTION 1 GM/26.3ML
1000.0000 mg/m2 | Freq: Once | INTRAVENOUS | Status: AC
  Administered 2015-10-18: 1824 mg via INTRAVENOUS
  Filled 2015-10-18: qty 47.97

## 2015-10-18 NOTE — Patient Instructions (Signed)
Cancer Center Discharge Instructions for Patients Receiving Chemotherapy  Today you received the following chemotherapy agents: Gemzar and Carboplatin   To help prevent nausea and vomiting after your treatment, we encourage you to take your nausea medication as directed.    If you develop nausea and vomiting that is not controlled by your nausea medication, call the clinic.   BELOW ARE SYMPTOMS THAT SHOULD BE REPORTED IMMEDIATELY:  *FEVER GREATER THAN 100.5 F  *CHILLS WITH OR WITHOUT FEVER  NAUSEA AND VOMITING THAT IS NOT CONTROLLED WITH YOUR NAUSEA MEDICATION  *UNUSUAL SHORTNESS OF BREATH  *UNUSUAL BRUISING OR BLEEDING  TENDERNESS IN MOUTH AND THROAT WITH OR WITHOUT PRESENCE OF ULCERS  *URINARY PROBLEMS  *BOWEL PROBLEMS  UNUSUAL RASH Items with * indicate a potential emergency and should be followed up as soon as possible.  Feel free to call the clinic you have any questions or concerns. The clinic phone number is [phone].  Please show the CHEMO ALERT CARD at check-in to the Emergency Department and triage nurse.   

## 2015-10-18 NOTE — Telephone Encounter (Signed)
Pt going to be out of town the week of April 10 emailed MD for new appts

## 2015-10-19 ENCOUNTER — Telehealth: Payer: Self-pay | Admitting: *Deleted

## 2015-10-19 ENCOUNTER — Encounter: Payer: Self-pay | Admitting: Nurse Practitioner

## 2015-10-19 ENCOUNTER — Telehealth: Payer: Self-pay | Admitting: Internal Medicine

## 2015-10-19 MED ORDER — LIDOCAINE-PRILOCAINE 2.5-2.5 % EX CREA: 1.0000 "application " | TOPICAL_CREAM | CUTANEOUS | Status: AC | PRN

## 2015-10-19 NOTE — Telephone Encounter (Signed)
Follow up call placed, unable to reach pt. LMOVM for pt to call back with any questions or concerns.

## 2015-10-19 NOTE — Assessment & Plan Note (Signed)
Patient presented to the [facility] today to receive cycle 1, day 1 of her carboplatin/gemcitabine chemotherapy regimen.  Patient states she's been feeling fairly well recently.  She denies any recent fevers or chills.  Patient is requesting the scheduling of a Port-A-Cath to be placed as soon as possible.  Also, patient is requesting a refill of her Compazine and her pain medication.  Will schedule Port-A-Cath per patient's request.  Refill patient's Compazine and her morphine immediate release 15 mg every 6 hours.  Will also prescribe him a cream to use prior to the port being accessed.  Dr. Julien Nordmann and to see patient as well; and all patient request were reviewed with him.  Patient has asked for a an alteration of her scheduled; stating that is difficult for her to make the drive to the [facility] on her off weeks of chemotherapy.  Therefore-patient will return on 10/25/2015 for labs and chemotherapy only.  She will not be required to have either a lab draw or H visit on her off week of chemotherapy.  She will be scheduled for labs, chemotherapy, and a visit on 11/13/2015.

## 2015-10-19 NOTE — Telephone Encounter (Signed)
-----   Message from Otila Kluver, RN sent at 10/18/2015  2:40 PM EDT ----- Regarding: [name]  1st time Gemzar Contact: [phone] 1st time Gemzar.  Tolerated well. No evidence of reaction during treatment.  Drucie Ip, RN

## 2015-10-19 NOTE — Assessment & Plan Note (Signed)
Patient has a history of hypothyroidism; as well as thyroid cancer.  Her TSH was is elevated more today at 7.3.  Patient was advised to follow-up with her primary care provider for adjustment of her thyroid medication.

## 2015-10-19 NOTE — Telephone Encounter (Signed)
lvm for pt regarding to nxt chemo in April after 3.30...Marland KitchenMarland KitchenMarland Kitchenpt will pick up new sched on 3.30

## 2015-10-19 NOTE — Progress Notes (Signed)
SYMPTOM MANAGEMENT CLINIC   HPI: [name] 70 y.o. female diagnosed with lung cancer. Presented to cancer center today to receive cycle 1 of carboplatin/gemzar chemotherapy regimen.    Patient presented to the [facility] today to receive cycle 1, day 1 of her carboplatin/gemcitabine chemotherapy regimen.  Patient states she's been feeling fairly well recently.  She denies any recent fevers or chills.  Patient is requesting the scheduling of a Port-A-Cath to be placed as soon as possible.  Also, patient is requesting a refill of her Compazine and her pain medication.  Will schedule Port-A-Cath per patient's request.  Refill patient's Compazine and her morphine immediate release 15 mg every 6 hours.  Will also prescribe him a cream to use prior to the port being accessed.  Dr. Julien Nordmann and to see patient as well; and all patient request were reviewed with him.  Patient has asked for a an alteration of her scheduled; stating that is difficult for her to make the drive to the [facility] on her off weeks of chemotherapy.  Therefore-patient will return on 10/25/2015 for labs and chemotherapy only.  She will not be required to have either a lab draw or H visit on her off week of chemotherapy.  She will be scheduled for labs, chemotherapy, and a visit on 11/13/2015.  HPI  Review of Systems  All other systems reviewed and are negative.   Past Medical History  Diagnosis Date  . Thyroid disease     hypothyroidism  . Hx of thyroid cancer   . Status post bilateral breast implants   . Consolidation lung ([place]) 03/21/14    RLL PER CXR/CT  . Hypertension     just started,not on medication  . Shortness of breath     exertion  . Hypothyroidism   . Mitral valve prolapse   . Anxiety   . Arthritis   . Fibromyalgia   . Complication of anesthesia     headaches,very agitated while waking  up  . Cancer ([place])     thyroid  . Lung cancer ([place])     2015  . Encounter for antineoplastic  immunotherapy 07/05/2015  . Hypothyroidism 07/26/2015    Past Surgical History  Procedure Laterality Date  . Thyroidectomy    . Cataract extraction Right   . Breast enhancement surgery    . Retinal detachment surgery    . Eye surgery    . Pars plana vitrectomy w/ repair of macular hole    . Back surgery      times 2  . Tonsillectomy    . Cervical cone biopsy    . Video bronchoscopy with endobronchial ultrasound N/A 04/07/2014    Procedure: VIDEO BRONCHOSCOPY WITH ENDOBRONCHIAL ULTRASOUND;  Surgeon: Melrose Nakayama, MD;  Location: [facility];  Service: Thoracic;  Laterality: N/A;    has Hypertension; Hyperlipidemia; Hx of thyroid cancer; Status post bilateral breast implants; Consolidation lung ([place]); Non-small cell cancer of right lung ([place]); Pulmonary infiltrates; Post-radiation pneumonitis ([place]); Encounter for antineoplastic immunotherapy; Hypothyroidism; and Encounter for antineoplastic chemotherapy on her problem list.    is allergic to niacin and related.    Medication List       This list is accurate as of: 10/18/15 11:59 PM.  Always use your most recent med list.               acetaminophen 325 MG tablet  Commonly known as:  TYLENOL  Take 650 mg by mouth every 6 (six) hours as  needed.     albuterol (2.5 MG/3ML) 0.083% nebulizer solution  Commonly known as:  PROVENTIL  Take 2.5 mg by nebulization every 6 (six) hours as needed for wheezing or shortness of breath.     amLODipine 5 MG tablet  Commonly known as:  NORVASC  Take 5 mg by mouth daily.     aspirin EC 81 MG tablet  Take 162 mg by mouth at bedtime.     clonazePAM 0.5 MG tablet  Commonly known as:  KLONOPIN  One three times a day with meals and 2 at bedtime     estradiol 0.5 MG tablet  Commonly known as:  ESTRACE  Take 0.5 mg by mouth daily.     FLUoxetine 20 MG tablet  Commonly known as:  PROZAC  Take by mouth.     HYDROcodone-homatropine 5-1.5 MG/5ML syrup  Commonly known as:  HYCODAN  Take 5  mLs by mouth every 6 (six) hours as needed for cough.     ibuprofen 100 MG tablet  Commonly known as:  ADVIL,MOTRIN  Take 200 mg by mouth every 6 (six) hours as needed for fever.     levothyroxine 125 MCG tablet  Commonly known as:  SYNTHROID, LEVOTHROID  Take 137 mcg by mouth daily before breakfast.     medroxyPROGESTERone 2.5 MG tablet  Commonly known as:  PROVERA  Take 2.5 mg by mouth daily.     morphine 15 MG tablet  Commonly known as:  MSIR  Take 1 tablet (15 mg total) by mouth every 6 (six) hours as needed for severe pain (pain).     prochlorperazine 10 MG tablet  Commonly known as:  COMPAZINE  Take 1 tablet (10 mg total) by mouth every 6 (six) hours as needed for nausea or vomiting.         PHYSICAL EXAMINATION  Oncology Vitals 10/18/2015 10/03/2015  Height 173 cm 173 cm  Weight 70.852 kg 69.4 kg  Weight (lbs) 156 lbs 3 oz 153 lbs  BMI (kg/m2) 23.75 kg/m2 23.26 kg/m2  Temp 98.2 -  Pulse 108 100  Resp 20 -  SpO2 98 95  BSA (m2) 1.84 m2 1.82 m2   BP Readings from Last 2 Encounters:  10/18/15 148/80  10/03/15 122/64    Physical Exam  Constitutional: She is oriented to person, place, and time and well-developed, well-nourished, and in no distress.  HENT:  Head: Normocephalic and atraumatic.  Mouth/Throat: Oropharynx is clear and moist.  Eyes: Conjunctivae and EOM are normal. Pupils are equal, round, and reactive to light. Right eye exhibits no discharge. Left eye exhibits no discharge. No scleral icterus.  Neck: Normal range of motion. Neck supple. No JVD present. No tracheal deviation present. No thyromegaly present.  Cardiovascular: Normal rate, regular rhythm, normal heart sounds and intact distal pulses.   Pulmonary/Chest: Effort normal and breath sounds normal. No respiratory distress. She has no wheezes. She has no rales. She exhibits no tenderness.  Abdominal: Soft. Bowel sounds are normal. She exhibits no distension and no mass. There is no tenderness.  There is no rebound and no guarding.  Musculoskeletal: Normal range of motion. She exhibits no edema or tenderness.  Lymphadenopathy:    She has no cervical adenopathy.  Neurological: She is alert and oriented to person, place, and time. Gait normal.  Skin: Skin is warm and dry. No rash noted. No erythema. No pallor.  Psychiatric: Affect normal.  Nursing note and vitals reviewed.   LABORATORY DATA:. Appointment on 10/18/2015  Component  Date Value Ref Range Status  . WBC 10/18/2015 8.3  3.9 - 10.3 10e3/uL Final  . NEUT# 10/18/2015 5.4  1.5 - 6.5 10e3/uL Final  . HGB 10/18/2015 13.8  11.6 - 15.9 g/dL Final  . HCT 10/18/2015 41.6  34.8 - 46.6 % Final  . Platelets 10/18/2015 332  145 - 400 10e3/uL Final  . MCV 10/18/2015 91.1  79.5 - 101.0 fL Final  . MCH 10/18/2015 30.2  25.1 - 34.0 pg Final  . MCHC 10/18/2015 33.1  31.5 - 36.0 g/dL Final  . RBC 10/18/2015 4.56  3.70 - 5.45 10e6/uL Final  . RDW 10/18/2015 13.6  11.2 - 14.5 % Final  . lymph# 10/18/2015 1.8  0.9 - 3.3 10e3/uL Final  . MONO# 10/18/2015 0.7  0.1 - 0.9 10e3/uL Final  . Eosinophils Absolute 10/18/2015 0.2  0.0 - 0.5 10e3/uL Final  . Basophils Absolute 10/18/2015 0.1  0.0 - 0.1 10e3/uL Final  . NEUT% 10/18/2015 65.0  38.4 - 76.8 % Final  . LYMPH% 10/18/2015 22.4  14.0 - 49.7 % Final  . MONO% 10/18/2015 8.5  0.0 - 14.0 % Final  . EOS% 10/18/2015 2.3  0.0 - 7.0 % Final  . BASO% 10/18/2015 1.8  0.0 - 2.0 % Final  . Sodium 10/18/2015 140  136 - 145 mEq/L Final  . Potassium 10/18/2015 4.0  3.5 - 5.1 mEq/L Final  . Chloride 10/18/2015 109  98 - 109 mEq/L Final  . CO2 10/18/2015 21* 22 - 29 mEq/L Final  . Glucose 10/18/2015 89  70 - 140 mg/dl Final   Glucose reference range is for nonfasting patients. Fasting glucose reference range is 70- 100.  Marland Kitchen BUN 10/18/2015 15.4  7.0 - 26.0 mg/dL Final  . Creatinine 10/18/2015 0.7  0.6 - 1.1 mg/dL Final  . Total Bilirubin 10/18/2015 0.44  0.20 - 1.20 mg/dL Final  . Alkaline Phosphatase  10/18/2015 125  40 - 150 U/L Final  . AST 10/18/2015 34  5 - 34 U/L Final  . ALT 10/18/2015 44  0 - 55 U/L Final  . Total Protein 10/18/2015 7.2  6.4 - 8.3 g/dL Final  . Albumin 10/18/2015 3.6  3.5 - 5.0 g/dL Final  . Calcium 10/18/2015 9.5  8.4 - 10.4 mg/dL Final  . Anion Gap 10/18/2015 10  3 - 11 mEq/L Final  . EGFR 10/18/2015 86* >90 ml/min/1.73 m2 Final   eGFR is calculated using the CKD-EPI Creatinine Equation (2009)  . TSH 10/18/2015 7.326* 0.308 - 3.960 m(IU)/L Final     RADIOGRAPHIC STUDIES: No results found.  ASSESSMENT/PLAN:    Non-small cell cancer of right lung Patient presented to the [facility] today to receive cycle 1, day 1 of her carboplatin/gemcitabine chemotherapy regimen.  Patient states she's been feeling fairly well recently.  She denies any recent fevers or chills.  Patient is requesting the scheduling of a Port-A-Cath to be placed as soon as possible.  Also, patient is requesting a refill of her Compazine and her pain medication.  Will schedule Port-A-Cath per patient's request.  Refill patient's Compazine and her morphine immediate release 15 mg every 6 hours.  Will also prescribe him a cream to use prior to the port being accessed.  Dr. Julien Nordmann and to see patient as well; and all patient request were reviewed with him.  Patient has asked for a an alteration of her scheduled; stating that is difficult for her to make the drive to the [place] on her off weeks of chemotherapy.  Therefore-patient will return on 10/25/2015 for labs and chemotherapy only.  She will not be required to have either a lab draw or H visit on her off week of chemotherapy.  She will be scheduled for labs, chemotherapy, and a visit on 11/13/2015.  Hypothyroidism Patient has a history of hypothyroidism; as well as thyroid cancer.  Her TSH was is elevated more today at 7.3.  Patient was advised to follow-up with her primary care provider for adjustment of her thyroid  medication.  Patient stated understanding of all instructions; and was in agreement with this plan of care. The patient knows to call the clinic with any problems, questions or concerns.   Thsi was a shared visit with Dr. Julien Nordmann today.   Total time spent with patient was 25 minutes;  with greater than 75 percent of that time spent in face to face counseling regarding patient's symptoms,  and coordination of care and follow up.  Disclaimer:This dictation was prepared with Dragon/digital dictation along with [employer]. Any transcriptional errors that result from this process are unintentional.  Drue Second, NP 10/19/2015   ADDENDUM: Hematology/Oncology Attending: I had a face to face encounter with the patient. I recommended her care plan. This is a very pleasant 70 years old white female with recurrent non-small cell lung cancer initially treated for stage IIIa non-small cell lung cancer with a course of concurrent chemoradiation followed by treatment with immunotherapy for disease recurrence with Tecentriq Huey Bienen[place]) which was discontinued secondary to disease progression. The patient recently underwent bronchial stent placement at [facility]. She did not notice any significant change in her condition. She is here today to start the first cycle of systemic chemotherapy with carboplatin and gemcitabine. The patient is feeling fine and recommended for her to proceed with her treatment today as a scheduled. She is requesting a Port-A-Cath placement and will do this by interventional radiology soon. She will come back for follow-up visit in 3 weeks for reevaluation with the start of cycle #2. The patient has a history of hypothyroidism which is currently managed by her primary care physician. She was advised to call immediately if she has any concerning symptoms in the interval.  Disclaimer: This note was dictated with voice recognition software. Similar  sounding words can inadvertently be transcribed and may be missed upon review. Eilleen Kempf., MD 10/20/2015

## 2015-10-23 ENCOUNTER — Other Ambulatory Visit: Payer: Self-pay | Admitting: General Surgery

## 2015-10-24 ENCOUNTER — Ambulatory Visit (HOSPITAL_COMMUNITY)
Admission: RE | Admit: 2015-10-24 | Discharge: 2015-10-24 | Disposition: A | Discharge status: Home / Self Care | Payer: Medicare Other | Source: Ambulatory Visit | Attending: Internal Medicine | Admitting: Internal Medicine

## 2015-10-24 ENCOUNTER — Encounter (HOSPITAL_COMMUNITY): Payer: Self-pay

## 2015-10-24 ENCOUNTER — Other Ambulatory Visit: Payer: Self-pay | Admitting: Nurse Practitioner

## 2015-10-24 ENCOUNTER — Telehealth: Payer: Self-pay | Admitting: *Deleted

## 2015-10-24 ENCOUNTER — Ambulatory Visit (HOSPITAL_COMMUNITY)
Admission: RE | Admit: 2015-10-24 | Discharge: 2015-10-24 | Disposition: A | Discharge status: Home / Self Care | Payer: Medicare Other | Source: Ambulatory Visit | Attending: Nurse Practitioner | Admitting: Nurse Practitioner

## 2015-10-24 DIAGNOSIS — Z7982 Long term (current) use of aspirin: Secondary | ICD-10-CM | POA: Insufficient documentation

## 2015-10-24 DIAGNOSIS — Z9221 Personal history of antineoplastic chemotherapy: Secondary | ICD-10-CM | POA: Insufficient documentation

## 2015-10-24 DIAGNOSIS — C73 Malignant neoplasm of thyroid gland: Secondary | ICD-10-CM | POA: Diagnosis not present

## 2015-10-24 DIAGNOSIS — F419 Anxiety disorder, unspecified: Secondary | ICD-10-CM | POA: Diagnosis not present

## 2015-10-24 DIAGNOSIS — I10 Essential (primary) hypertension: Secondary | ICD-10-CM | POA: Diagnosis not present

## 2015-10-24 DIAGNOSIS — M797 Fibromyalgia: Secondary | ICD-10-CM | POA: Insufficient documentation

## 2015-10-24 DIAGNOSIS — I341 Nonrheumatic mitral (valve) prolapse: Secondary | ICD-10-CM | POA: Insufficient documentation

## 2015-10-24 DIAGNOSIS — M1990 Unspecified osteoarthritis, unspecified site: Secondary | ICD-10-CM | POA: Diagnosis not present

## 2015-10-24 DIAGNOSIS — C3490 Malignant neoplasm of unspecified part of unspecified bronchus or lung: Secondary | ICD-10-CM | POA: Insufficient documentation

## 2015-10-24 DIAGNOSIS — Z87891 Personal history of nicotine dependence: Secondary | ICD-10-CM | POA: Insufficient documentation

## 2015-10-24 DIAGNOSIS — E890 Postprocedural hypothyroidism: Secondary | ICD-10-CM | POA: Diagnosis not present

## 2015-10-24 DIAGNOSIS — Z923 Personal history of irradiation: Secondary | ICD-10-CM | POA: Insufficient documentation

## 2015-10-24 DIAGNOSIS — C3491 Malignant neoplasm of unspecified part of right bronchus or lung: Secondary | ICD-10-CM

## 2015-10-24 LAB — CBC
HEMATOCRIT: 36.3 % (ref 36.0–46.0)
Hemoglobin: 12.3 g/dL (ref 12.0–15.0)
MCH: 30.5 pg (ref 26.0–34.0)
MCHC: 33.9 g/dL (ref 30.0–36.0)
MCV: 90.1 fL (ref 78.0–100.0)
Platelets: 288 10*3/uL (ref 150–400)
RBC: 4.03 MIL/uL (ref 3.87–5.11)
RDW: 12.8 % (ref 11.5–15.5)
WBC: 4.8 10*3/uL (ref 4.0–10.5)

## 2015-10-24 LAB — PROTIME-INR
INR: 0.95 (ref 0.00–1.49)
Prothrombin Time: 12.9 seconds (ref 11.6–15.2)

## 2015-10-24 LAB — APTT: aPTT: 35 seconds (ref 24–37)

## 2015-10-24 MED ORDER — HEPARIN SOD (PORK) LOCK FLUSH 100 UNIT/ML IV SOLN
INTRAVENOUS | Status: AC | PRN
  Administered 2015-10-24: 500 [IU]

## 2015-10-24 MED ORDER — CEFAZOLIN SODIUM-DEXTROSE 2-4 GM/100ML-% IV SOLN
2.0000 g | INTRAVENOUS | Status: AC
  Administered 2015-10-24: 2 g via INTRAVENOUS
  Filled 2015-10-24: qty 100

## 2015-10-24 MED ORDER — LIDOCAINE-EPINEPHRINE 2 %-1:100000 IJ SOLN
INTRAMUSCULAR | Status: AC
  Filled 2015-10-24: qty 1

## 2015-10-24 MED ORDER — MIDAZOLAM HCL 2 MG/2ML IJ SOLN
INTRAMUSCULAR | Status: AC | PRN
  Administered 2015-10-24 (×4): 0.5 mg via INTRAVENOUS
  Administered 2015-10-24: 1 mg via INTRAVENOUS

## 2015-10-24 MED ORDER — LIDOCAINE-EPINEPHRINE 1 %-1:100000 IJ SOLN
INTRAMUSCULAR | Status: AC | PRN
  Administered 2015-10-24: 10 mL via INTRADERMAL

## 2015-10-24 MED ORDER — HEPARIN SOD (PORK) LOCK FLUSH 100 UNIT/ML IV SOLN
INTRAVENOUS | Status: AC
  Filled 2015-10-24: qty 5

## 2015-10-24 MED ORDER — MIDAZOLAM HCL 2 MG/2ML IJ SOLN
INTRAMUSCULAR | Status: AC
  Filled 2015-10-24: qty 4

## 2015-10-24 MED ORDER — FENTANYL CITRATE (PF) 100 MCG/2ML IJ SOLN
INTRAMUSCULAR | Status: AC | PRN
  Administered 2015-10-24: 50 ug via INTRAVENOUS
  Administered 2015-10-24 (×2): 25 ug via INTRAVENOUS

## 2015-10-24 MED ORDER — FENTANYL CITRATE (PF) 100 MCG/2ML IJ SOLN
INTRAMUSCULAR | Status: AC
  Filled 2015-10-24: qty 2

## 2015-10-24 MED ORDER — SODIUM CHLORIDE 0.9 % IV SOLN
INTRAVENOUS | Status: DC
  Administered 2015-10-24: 10:00:00 via INTRAVENOUS

## 2015-10-24 NOTE — Procedures (Signed)
Successful placement of right IJ approach port-a-cath with tip at the superior caval atrial junction. The catheter is ready for immediate use. Estimated Blood Loss: Minimal No immediate post procedural complications.  Jay Arelly Whittenberg, MD Pager #: [phone]   

## 2015-10-24 NOTE — Sedation Documentation (Signed)
Patient denies pain and is resting comfortably.  

## 2015-10-24 NOTE — Telephone Encounter (Signed)
"  I'm having port-a-cath placed today.  Was asked about a cream.  Could Dr. Julien Nordmann send an order for this to [facility] on [address] on how to use cream.  Tomorrow we can apply ice if she has a dressing placed she should not compromise.  At this time observe  order was sent 10-19-2015.  Called Pharmacy to fill as this may have been restocked.  Per Gerald Stabs, "It's ready for her to pick up".

## 2015-10-24 NOTE — H&P (Signed)
Chief Complaint: Patient was seen in consultation today for port a cath placement  Referring Physician(s): Bacon,Cynthia R/Mohamed,Mohamed  Supervising Physician: Sandi Mariscal  History of Present Illness: [name] is a 70 y.o. female with prior history of thyroid cancer status post thyroidectomy as well as non-small cell lung cancer and initially treated for stage IIIa non-small cell lung cancer with chemoradiation followed by immunotherapy. Also noted is history of bronchial stent placement at [facility]. Recent CT chest has revealed slight interval increase in size of previously noted right lower lobe mass. She presents today for Port-A-Cath placement for additional chemotherapy. Additional past medical history as listed below. Past Medical History  Diagnosis Date  . Thyroid disease     hypothyroidism  . Hx of thyroid cancer   . Status post bilateral breast implants   . Consolidation lung ([place]) 03/21/14    RLL PER CXR/CT  . Hypertension     just started,not on medication  . Shortness of breath     exertion  . Hypothyroidism   . Mitral valve prolapse   . Anxiety   . Arthritis   . Fibromyalgia   . Complication of anesthesia     headaches,very agitated while waking  up  . Cancer ([place])     thyroid  . Lung cancer ([place])     2015  . Encounter for antineoplastic immunotherapy 07/05/2015  . Hypothyroidism 07/26/2015    Past Surgical History  Procedure Laterality Date  . Thyroidectomy    . Cataract extraction Right   . Breast enhancement surgery    . Retinal detachment surgery    . Eye surgery    . Pars plana vitrectomy w/ repair of macular hole    . Back surgery      times 2  . Tonsillectomy    . Cervical cone biopsy    . Video bronchoscopy with endobronchial ultrasound N/A 04/07/2014    Procedure: VIDEO BRONCHOSCOPY WITH ENDOBRONCHIAL ULTRASOUND;  Surgeon: Melrose Nakayama, MD;  Location: [facility];  Service: Thoracic;  Laterality: N/A;     Allergies: Succinylcholine and Niacin and related  Medications: Prior to Admission medications   Medication Sig Start Date End Date Taking? Authorizing Provider  acetaminophen (TYLENOL) 325 MG tablet Take 650 mg by mouth every 6 (six) hours as needed.   Yes Historical Provider, MD  amLODipine (NORVASC) 5 MG tablet Take 5 mg by mouth daily.  04/21/14  Yes Historical Provider, MD  clonazePAM (KLONOPIN) 0.5 MG tablet One three times a day with meals and 2 at bedtime Patient taking differently: Take 0.5 mg by mouth as needed for anxiety.  09/25/14  Yes Tanda Rockers, MD  estradiol (ESTRACE) 0.5 MG tablet Take 0.5 mg by mouth daily.   Yes Historical Provider, MD  FLUoxetine (PROZAC) 20 MG tablet Take by mouth.   Yes Historical Provider, MD  HYDROcodone-homatropine (HYCODAN) 5-1.5 MG/5ML syrup Take 5 mLs by mouth every 6 (six) hours as needed for cough. 06/07/15  Yes Curt Bears, MD  ibuprofen (ADVIL,MOTRIN) 100 MG tablet Take 200 mg by mouth every 6 (six) hours as needed for fever.   Yes Historical Provider, MD  levothyroxine (SYNTHROID, LEVOTHROID) 125 MCG tablet Take 137 mcg by mouth daily before breakfast.    Yes Historical Provider, MD  medroxyPROGESTERone (PROVERA) 2.5 MG tablet Take 2.5 mg by mouth daily.   Yes Historical Provider, MD  prochlorperazine (COMPAZINE) 10 MG tablet Take 1 tablet (10 mg total) by mouth every 6 (six)  hours as needed for nausea or vomiting. 10/18/15  Yes Susanne Borders, NP  albuterol (PROVENTIL) (2.5 MG/3ML) 0.083% nebulizer solution Take 2.5 mg by nebulization every 6 (six) hours as needed for wheezing or shortness of breath.    Historical Provider, MD  aspirin EC 81 MG tablet Take 162 mg by mouth at bedtime.     Historical Provider, MD  lidocaine-prilocaine (EMLA) cream Apply 1 application topically as needed. 10/19/15   Susanne Borders, NP  morphine (MSIR) 15 MG tablet Take 1 tablet (15 mg total) by mouth every 6 (six) hours as needed for severe pain (pain).  10/18/15   Susanne Borders, NP     Family History  Problem Relation Age of Onset  . Early death Mother   . Heart disease Mother   . Early death Father   . Heart disease Father     MI  . Cancer Sister     BREAST  . Heart disease Sister     MI 2013    Social History   Social History  . Marital Status: Married    Spouse Name: N/A  . Number of Children: N/A  . Years of Education: N/A   Social History Main Topics  . Smoking status: Former Smoker -- 0.50 packs/day for 49 years    Types: Cigarettes    Quit date: 02/25/2014  . Smokeless tobacco: Never Used  . Alcohol Use: 0.0 oz/week    0 Standard drinks or equivalent per week     Comment: SOCIAL  . Drug Use: Yes    Special: Marijuana     Comment: 1970's  . Sexual Activity: Not Asked   Other Topics Concern  . None   Social History Narrative     Review of Systems  Constitutional: Negative for fever and chills.  Respiratory:       Occ cough and some dyspnea with exertion  Cardiovascular: Negative for chest pain.  Gastrointestinal: Negative for nausea, vomiting, abdominal pain and blood in stool.  Genitourinary: Negative for dysuria and hematuria.  Musculoskeletal: Positive for back pain.  Neurological: Positive for headaches.    Vital Signs: BP 133/78 mmHg  Pulse 115  Temp(Src) 97.7 F (36.5 C) (Oral)  Resp 16  SpO2 98%  Physical Exam  Constitutional: She is oriented to person, place, and time. She appears well-developed and well-nourished.  Cardiovascular:  Tachy but regular rhythm  Pulmonary/Chest: Effort normal.  Slightly diminished breath sounds right base, left clear.  Abdominal: Soft. Bowel sounds are normal. There is no tenderness.  Musculoskeletal: Normal range of motion. She exhibits no edema.  Neurological: She is alert and oriented to person, place, and time.    Mallampati Score:     Imaging: Ct Chest W Contrast  09/25/2015  CLINICAL DATA:  70 year old female with history of  non-small-cell right-sided lung cancer status post chemotherapy and radiation therapy, as well as immunotherapy. EXAM: CT CHEST WITH CONTRAST TECHNIQUE: Multidetector CT imaging of the chest was performed during intravenous contrast administration. CONTRAST:  63m OMNIPAQUE IOHEXOL 300 MG/ML  SOLN COMPARISON:  Chest CT 08/14/2015. FINDINGS: Mediastinum/Lymph Nodes: Heart size is normal. Small amount of pericardial fluid and/or thickening, similar to the prior study, and unlikely to be of any hemodynamic significance at this time. No associated pericardial calcification. There is atherosclerosis of the thoracic aorta, the great vessels of the mediastinum and the coronary arteries, including calcified atherosclerotic plaque in the left main, left anterior descending and right coronary arteries. Prominent soft tissue in  the right hilar region, inseparable from the right lower lobe mass, potentially lymphadenopathy. No other mediastinal or left hilar lymphadenopathy is noted. Esophagus is unremarkable in appearance. No axillary lymphadenopathy. Lungs/Pleura: Again noted is a mass in the medial aspect of the right lower lobe, which currently measures 3.8 x 3.0 cm (image 30 of series 2), which slightly larger than the prior examination (3.2 x 2.5 cm on 08/14/2015). This mass completely occludes the bronchus intermedius, resulting in completed occlusion of right middle lobe bronchi and chronic atelectasis of the right middle lobe. The right lower lobe bronchi also appear occluded, however, there distally patent, and there is good aeration throughout much of the right lower lobe. There is again a band-like area of paramediastinal radiation fibrosis in the right upper lobe and superior segment of the right lower lobe, similar to prior examination. Scattered areas of mild septal thickening and thickening of the peribronchovascular interstitium throughout the basal segments of the right lower lobe appear very similar to the  prior study and are favored to reflect postobstructive changes (rather than lymphangitic spread of tumor), bold warrant continued attention on future followup examinations. Small partially loculated right-sided pleural effusion is again noted, only slightly larger than the prior examination. 5 mm peripheral nodule in the left upper lobe (image 15 of series 3) is unchanged compared to the prior study and nonspecific. Upper Abdomen: Hypervascular area in the central liver, predominantly involving segments 5 and 8, remains amorphous, and is strongly favored to represent a perfusion anomaly. Atherosclerosis. Musculoskeletal/Soft Tissues: Bilateral breast implants incidentally noted. There are no aggressive appearing lytic or blastic lesions noted in the visualized portions of the skeleton. IMPRESSION: 1. Slight interval increase in size of previously noted right lower lobe mass, which currently measures 3.0 x 3.8 cm. While this could represent some mild progression of disease, this may alternatively represent "pseudo progression" in the setting of recent immunotherapy. Continued attention on future followup examinations is recommended. 2. Small right pleural effusion is only slightly larger than the prior examination. 3. Mild postobstructive changes throughout the basal segments of the right lower lobe appear similar to the prior examination. Complete chronic atelectasis of the right middle lobe has worsened compared to the prior examination. 4. Small pericardial effusion is unchanged, unlikely to be of any hemodynamic significance. 5. Atherosclerosis, including left main and 2 vessel coronary artery disease. 6. Additional incidental findings, as above. Electronically Signed   By: Vinnie Langton M.D.   On: 09/25/2015 15:20    Labs:  CBC:  Recent Labs  09/06/15 0918 09/27/15 1002 10/18/15 1019 10/24/15 1005  WBC 7.3 6.8 8.3 4.8  HGB 14.5 14.2 13.8 12.3  HCT 44.2 43.4 41.6 36.3  PLT 298 292 332 288     COAGS: No results for input(s): INR, APTT in the last 8760 hours.  BMP:  Recent Labs  08/16/15 1141 09/06/15 0918 09/27/15 1002 10/18/15 1019  NA 139 140 139 140  K 4.1 4.2 4.2 4.0  CO2 20* 18* 21* 21*  GLUCOSE 86 95 78 89  BUN 12.1 13.8 12.5 15.4  CALCIUM 9.5 9.6 9.7 9.5  CREATININE 0.7 0.8 0.8 0.7    LIVER FUNCTION TESTS:  Recent Labs  08/16/15 1141 09/06/15 0918 09/27/15 1002 10/18/15 1019  BILITOT 0.38 0.43 0.34 0.44  AST '14 17 17 '$ 34  ALT '11 17 12 '$ 44  ALKPHOS 90 103 109 125  PROT 7.1 7.4 7.4 7.2  ALBUMIN 3.9 3.9 4.0 3.6    TUMOR MARKERS: No results  for input(s): AFPTM, CEA, CA199, CHROMGRNA in the last 8760 hours.  Assessment and Plan: 70 y.o. female with prior history of thyroid cancer status post thyroidectomy as well as non-small cell lung cancer and initially treated for stage IIIa non-small cell lung cancer with chemoradiation followed by immunotherapy. Also noted is history of bronchial stent placement at [facility]. Recent CT chest has revealed slight interval increase in size of previously noted right lower lobe mass. She presents today for Port-A-Cath placement for additional chemotherapy.Risks and benefits discussed with the patient/husband including, but not limited to bleeding, infection, pneumothorax, or fibrin sheath development and need for additional procedures.All of the patient's questions were answered, patient is agreeable to proceed.Consent signed and in chart.     Thank you for this interesting consult.  I greatly enjoyed meeting [name] and look forward to participating in their care.  A copy of this report was sent to the requesting provider on this date.  Electronically Signed: D. Rowe Robert 10/24/2015, 10:49 AM   I spent a total of 20 minutes in face to face in clinical consultation, greater than 50% of which was counseling/coordinating care for port a cath placement

## 2015-10-24 NOTE — Discharge Instructions (Signed)
Moderate Conscious Sedation, Adult, Care After °Refer to this sheet in the next few weeks. These instructions provide you with information on caring for yourself after your procedure. Your health care provider may also give you more specific instructions. Your treatment has been planned according to current medical practices, but problems sometimes occur. Call your health care provider if you have any problems or questions after your procedure. °WHAT TO EXPECT AFTER THE PROCEDURE  °After your procedure: °· You may feel sleepy, clumsy, and have poor balance for several hours. °· Vomiting may occur if you eat too soon after the procedure. °HOME CARE INSTRUCTIONS °· Do not participate in any activities where you could become injured for at least 24 hours. Do not: °¨ Drive. °¨ Swim. °¨ Ride a bicycle. °¨ Operate heavy machinery. °¨ Cook. °¨ Use power tools. °¨ Climb ladders. °¨ Work from a high place. °· Do not make important decisions or sign legal documents until you are improved. °· If you vomit, drink water, juice, or soup when you can drink without vomiting. Make sure you have little or no nausea before eating solid foods. °· Only take over-the-counter or prescription medicines for pain, discomfort, or fever as directed by your health care provider. °· Make sure you and your family fully understand everything about the medicines given to you, including what side effects may occur. °· You should not drink alcohol, take sleeping pills, or take medicines that cause drowsiness for at least 24 hours. °· If you smoke, do not smoke without supervision. °· If you are feeling better, you may resume normal activities 24 hours after you were sedated. °· Keep all appointments with your health care provider. °SEEK MEDICAL CARE IF: °· Your skin is pale or bluish in color. °· You continue to feel nauseous or vomit. °· Your pain is getting worse and is not helped by medicine. °· You have bleeding or swelling. °· You are still  sleepy or feeling clumsy after 24 hours. °SEEK IMMEDIATE MEDICAL CARE IF: °· You develop a rash. °· You have difficulty breathing. °· You develop any type of allergic problem. °· You have a fever. °MAKE SURE YOU: °· Understand these instructions. °· Will watch your condition. °· Will get help right away if you are not doing well or get worse. °  °This information is not intended to replace advice given to you by your health care provider. Make sure you discuss any questions you have with your health care provider. °  °Document Released: 05/04/2013 Document Revised: 08/04/2014 Document Reviewed: 05/04/2013 °Elsevier Interactive Patient Education ©2016 [employer] °Implanted Port Insertion, Care After °Refer to this sheet in the next few weeks. These instructions provide you with information on caring for yourself after your procedure. Your health care provider may also give you more specific instructions. Your treatment has been planned according to current medical practices, but problems sometimes occur. Call your health care provider if you have any problems or questions after your procedure. °WHAT TO EXPECT AFTER THE PROCEDURE °After your procedure, it is typical to have the following:  °· Discomfort at the port insertion site. Ice packs to the area will help. °· Bruising on the skin over the port. This will subside in 3-4 days. °HOME CARE INSTRUCTIONS °· After your port is placed, you will get a manufacturer's information card. The card has information about your port. Keep this card with you at all times.   °· Know what kind of port you have. There are many types   of ports available.   °· Wear a medical alert bracelet in case of an emergency. This can help alert health care workers that you have a port.   °· The port can stay in for as long as your health care provider believes it is necessary.   °· A home health care nurse may give medicines and take care of the port.   °· You or a family member can get  special training and directions for giving medicine and taking care of the port at home.   °SEEK MEDICAL CARE IF:  °· Your port does not flush or you are unable to get a blood return.   °· You have a fever or chills. °SEEK IMMEDIATE MEDICAL CARE IF: °· You have new fluid or pus coming from your incision.   °· You notice a bad smell coming from your incision site.   °· You have swelling, pain, or more redness at the incision or port site.   °· You have chest pain or shortness of breath. °  °This information is not intended to replace advice given to you by your health care provider. Make sure you discuss any questions you have with your health care provider. °  °Document Released: 05/04/2013 Document Revised: 07/19/2013 Document Reviewed: 05/04/2013 °Elsevier Interactive Patient Education ©2016 [employer] °Implanted Port Home Guide °An implanted port is a type of central line that is placed under the skin. Central lines are used to provide IV access when treatment or nutrition needs to be given through a person's veins. Implanted ports are used for long-term IV access. An implanted port may be placed because:  °· You need IV medicine that would be irritating to the small veins in your hands or arms.   °· You need long-term IV medicines, such as antibiotics.   °· You need IV nutrition for a long period.   °· You need frequent blood draws for lab tests.   °· You need dialysis.   °Implanted ports are usually placed in the chest area, but they can also be placed in the upper arm, the abdomen, or the leg. An implanted port has two main parts:  °· Reservoir. The reservoir is round and will appear as a small, raised area under your skin. The reservoir is the part where a needle is inserted to give medicines or draw blood.   °· Catheter. The catheter is a thin, flexible tube that extends from the reservoir. The catheter is placed into a large vein. Medicine that is inserted into the reservoir goes into the catheter and  then into the vein.   °HOW WILL I CARE FOR MY INCISION SITE? °Do not get the incision site wet. Bathe or shower as directed by your health care provider.  °HOW IS MY PORT ACCESSED? °Special steps must be taken to access the port:  °· Before the port is accessed, a numbing cream can be placed on the skin. This helps numb the skin over the port site.   °· Your health care provider uses a sterile technique to access the port. °· Your health care provider must put on a mask and sterile gloves. °· The skin over your port is cleaned carefully with an antiseptic and allowed to dry. °· The port is gently pinched between sterile gloves, and a needle is inserted into the port. °· Only "non-coring" port needles should be used to access the port. Once the port is accessed, a blood return should be checked. This helps ensure that the port is in the vein and is not clogged.   °· If your port   needs to remain accessed for a constant infusion, a clear (transparent) bandage will be placed over the needle site. The bandage and needle will need to be changed every week, or as directed by your health care provider.   °· Keep the bandage covering the needle clean and dry. Do not get it wet. Follow your health care provider's instructions on how to take a shower or bath while the port is accessed.   °· If your port does not need to stay accessed, no bandage is needed over the port.   °WHAT IS FLUSHING? °Flushing helps keep the port from getting clogged. Follow your health care provider's instructions on how and when to flush the port. Ports are usually flushed with saline solution or a medicine called heparin. The need for flushing will depend on how the port is used.  °· If the port is used for intermittent medicines or blood draws, the port will need to be flushed:   °· After medicines have been given.   °· After blood has been drawn.   °· As part of routine maintenance.   °· If a constant infusion is running, the port may not need to  be flushed.   °HOW LONG WILL MY PORT STAY IMPLANTED? °The port can stay in for as long as your health care provider thinks it is needed. When it is time for the port to come out, surgery will be done to remove it. The procedure is similar to the one performed when the port was put in.  °WHEN SHOULD I SEEK IMMEDIATE MEDICAL CARE? °When you have an implanted port, you should seek immediate medical care if:  °· You notice a bad smell coming from the incision site.   °· You have swelling, redness, or drainage at the incision site.   °· You have more swelling or pain at the port site or the surrounding area.   °· You have a fever that is not controlled with medicine. °  °This information is not intended to replace advice given to you by your health care provider. Make sure you discuss any questions you have with your health care provider. °  °Document Released: 07/14/2005 Document Revised: 05/04/2013 Document Reviewed: 03/21/2013 °Elsevier Interactive Patient Education ©2016 [employer] Inc. ° °

## 2015-10-25 ENCOUNTER — Other Ambulatory Visit (HOSPITAL_BASED_OUTPATIENT_CLINIC_OR_DEPARTMENT_OTHER): Payer: Medicare Other

## 2015-10-25 ENCOUNTER — Ambulatory Visit (HOSPITAL_BASED_OUTPATIENT_CLINIC_OR_DEPARTMENT_OTHER): Payer: Medicare Other

## 2015-10-25 DIAGNOSIS — C3491 Malignant neoplasm of unspecified part of right bronchus or lung: Secondary | ICD-10-CM | POA: Diagnosis not present

## 2015-10-25 DIAGNOSIS — Z5111 Encounter for antineoplastic chemotherapy: Secondary | ICD-10-CM | POA: Diagnosis not present

## 2015-10-25 LAB — CBC WITH DIFFERENTIAL/PLATELET
BASO%: 0.6 % (ref 0.0–2.0)
BASOS ABS: 0 10*3/uL (ref 0.0–0.1)
EOS%: 1.3 % (ref 0.0–7.0)
Eosinophils Absolute: 0.1 10*3/uL (ref 0.0–0.5)
HCT: 37.2 % (ref 34.8–46.6)
HEMOGLOBIN: 12.2 g/dL (ref 11.6–15.9)
LYMPH%: 24.2 % (ref 14.0–49.7)
MCH: 30.2 pg (ref 25.1–34.0)
MCHC: 32.9 g/dL (ref 31.5–36.0)
MCV: 91.8 fL (ref 79.5–101.0)
MONO#: 0.4 10*3/uL (ref 0.1–0.9)
MONO%: 7.7 % (ref 0.0–14.0)
NEUT#: 3.4 10*3/uL (ref 1.5–6.5)
NEUT%: 66.2 % (ref 38.4–76.8)
Platelets: 224 10*3/uL (ref 145–400)
RBC: 4.05 10*6/uL (ref 3.70–5.45)
RDW: 13.1 % (ref 11.2–14.5)
WBC: 5.2 10*3/uL (ref 3.9–10.3)
lymph#: 1.2 10*3/uL (ref 0.9–3.3)

## 2015-10-25 LAB — COMPREHENSIVE METABOLIC PANEL
ALT: 33 U/L (ref 0–55)
AST: 27 U/L (ref 5–34)
Albumin: 3.5 g/dL (ref 3.5–5.0)
Alkaline Phosphatase: 125 U/L (ref 40–150)
Anion Gap: 9 mEq/L (ref 3–11)
BUN: 12.2 mg/dL (ref 7.0–26.0)
CHLORIDE: 106 meq/L (ref 98–109)
CO2: 25 meq/L (ref 22–29)
Calcium: 9.2 mg/dL (ref 8.4–10.4)
Creatinine: 0.7 mg/dL (ref 0.6–1.1)
EGFR: 89 mL/min/{1.73_m2} — ABNORMAL LOW (ref >90)
GLUCOSE: 89 mg/dL (ref 70–140)
POTASSIUM: 4.8 meq/L (ref 3.5–5.1)
SODIUM: 140 meq/L (ref 136–145)
Total Bilirubin: 0.36 mg/dL (ref 0.20–1.20)
Total Protein: 7.1 g/dL (ref 6.4–8.3)

## 2015-10-25 MED ORDER — HEPARIN SOD (PORK) LOCK FLUSH 100 UNIT/ML IV SOLN
500.0000 [IU] | Freq: Once | INTRAVENOUS | Status: AC | PRN
  Administered 2015-10-25: 500 [IU]
  Filled 2015-10-25: qty 5

## 2015-10-25 MED ORDER — SODIUM CHLORIDE 0.9 % IV SOLN
Freq: Once | INTRAVENOUS | Status: AC
  Administered 2015-10-25: 11:00:00 via INTRAVENOUS

## 2015-10-25 MED ORDER — PROCHLORPERAZINE MALEATE 10 MG PO TABS
10.0000 mg | ORAL_TABLET | Freq: Once | ORAL | Status: AC
  Administered 2015-10-25: 10 mg via ORAL

## 2015-10-25 MED ORDER — PROCHLORPERAZINE MALEATE 10 MG PO TABS
ORAL_TABLET | ORAL | Status: AC
  Filled 2015-10-25: qty 1

## 2015-10-25 MED ORDER — SODIUM CHLORIDE 0.9 % IV SOLN
1000.0000 mg/m2 | Freq: Once | INTRAVENOUS | Status: AC
  Administered 2015-10-25: 1824 mg via INTRAVENOUS
  Filled 2015-10-25: qty 47.97

## 2015-10-25 MED ORDER — SODIUM CHLORIDE 0.9% FLUSH
10.0000 mL | INTRAVENOUS | Status: DC | PRN
  Administered 2015-10-25: 10 mL
  Filled 2015-10-25: qty 10

## 2015-10-25 NOTE — Patient Instructions (Signed)
[facility] Discharge Instructions for Patients Receiving Chemotherapy  Today you received the following chemotherapy agents: Gemzar  To help prevent nausea and vomiting after your treatment, we encourage you to take your nausea medication: Compazine. Take one every 6 hours as needed.  If you develop nausea and vomiting that is not controlled by your nausea medication, call the clinic.   BELOW ARE SYMPTOMS THAT SHOULD BE REPORTED IMMEDIATELY:  *FEVER GREATER THAN 100.5 F  *CHILLS WITH OR WITHOUT FEVER  NAUSEA AND VOMITING THAT IS NOT CONTROLLED WITH YOUR NAUSEA MEDICATION  *UNUSUAL SHORTNESS OF BREATH  *UNUSUAL BRUISING OR BLEEDING  TENDERNESS IN MOUTH AND THROAT WITH OR WITHOUT PRESENCE OF ULCERS  *URINARY PROBLEMS  *BOWEL PROBLEMS  UNUSUAL RASH Items with * indicate a potential emergency and should be followed up as soon as possible.  Feel free to call the clinic should you have any questions or concerns. The clinic phone number is [phone].  Please show the CHEMO ALERT CARD at check-in to the Emergency Department and triage nurse.   

## 2015-10-30 ENCOUNTER — Encounter (HOSPITAL_COMMUNITY)
Admission: RE | Admit: 2015-10-30 | Discharge: 2015-10-30 | Disposition: A | Discharge status: Home / Self Care | Payer: Medicare Other | Source: Ambulatory Visit | Attending: Internal Medicine | Admitting: Internal Medicine

## 2015-10-30 DIAGNOSIS — C3490 Malignant neoplasm of unspecified part of unspecified bronchus or lung: Secondary | ICD-10-CM | POA: Insufficient documentation

## 2015-10-30 DIAGNOSIS — J984 Other disorders of lung: Secondary | ICD-10-CM | POA: Insufficient documentation

## 2015-11-01 ENCOUNTER — Ambulatory Visit: Payer: Medicare Other

## 2015-11-01 ENCOUNTER — Other Ambulatory Visit: Payer: Medicare Other

## 2015-11-01 ENCOUNTER — Ambulatory Visit: Payer: Medicare Other | Admitting: Internal Medicine

## 2015-11-01 ENCOUNTER — Encounter (HOSPITAL_COMMUNITY): Payer: Medicare Other

## 2015-11-03 IMAGING — CT CT CHEST W/ CM
2 of 3 series · 15 of 36 positions shown, 18 images · IV contrast (OMNIPAQUE)
Comparison: 02/01/2015 chest CT.

CLINICAL DATA: Non-small cell right lung cancer diagnosed in
March 2014 status post completion of chemotherapy and radiation
therapy in May 2014, presenting for restaging. Post radiation
pneumonitis. Thyroidectomy for thyroid cancer in 1714.

EXAM:
CT CHEST WITH CONTRAST
TECHNIQUE: Multidetector CT imaging of the chest was performed during
intravenous contrast administration.
CONTRAST:  75mL OMNIPAQUE IOHEXOL 300 MG/ML  SOLN

[Series 2: chest with st · axial · 0.74mm/px · z∈[-272,-2]mm · 12 of 64 slices shown, 15 images]
[im 5/64  mediastinal]
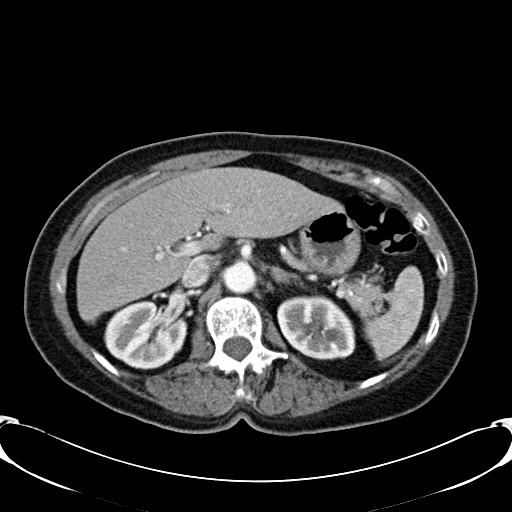
[im 5/64  lung]
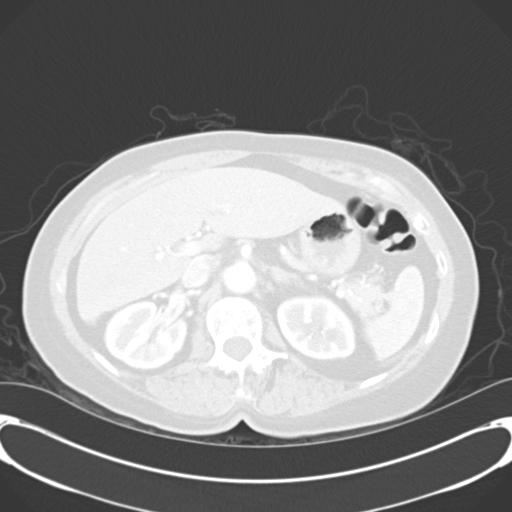
[im 10/64  lung]
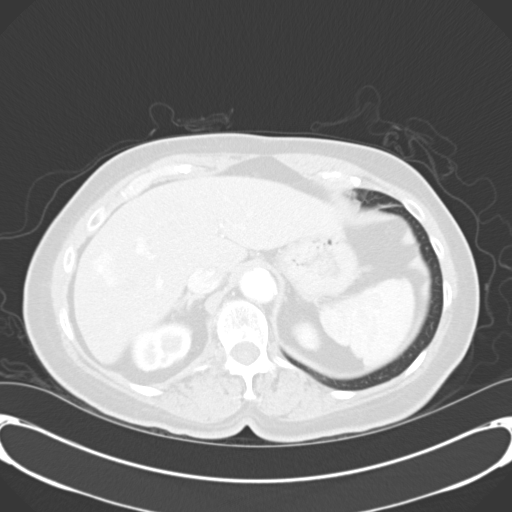
[im 15/64  lung]
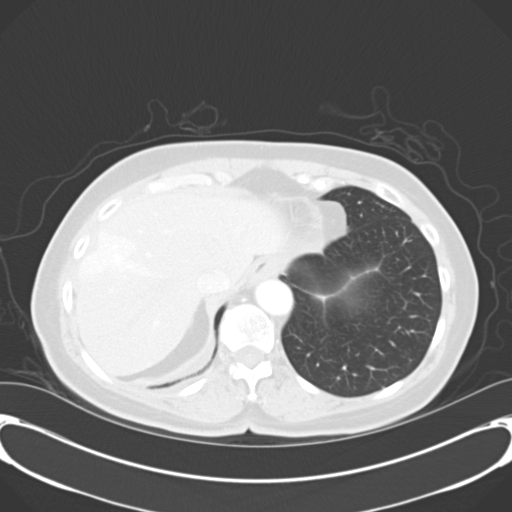
[im 19/64  lung]
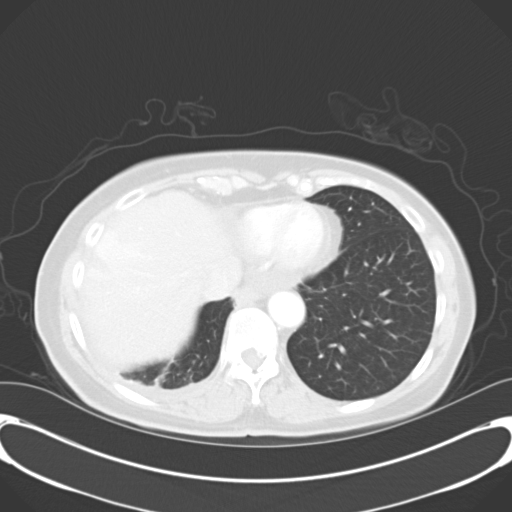
[im 24/64  mediastinal]
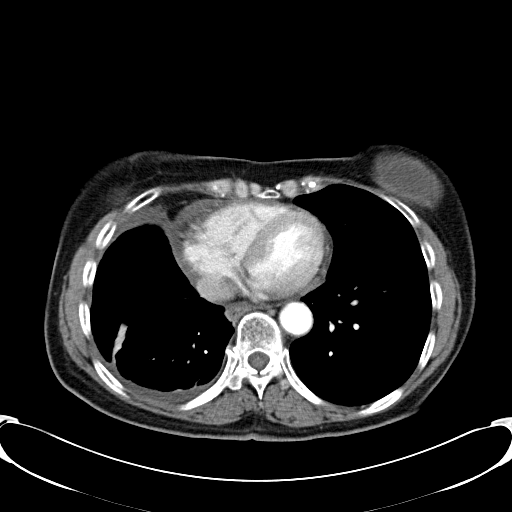
[im 24/64  lung]
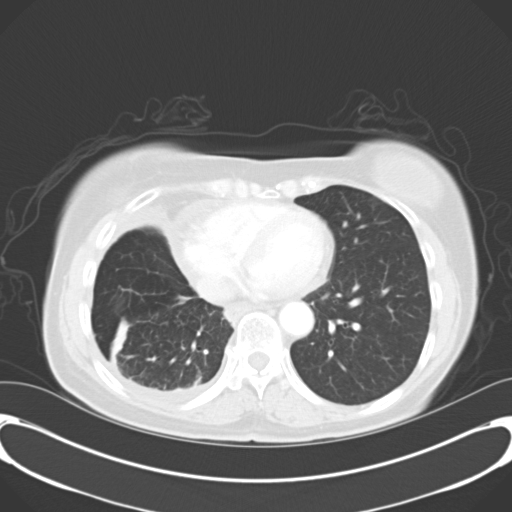
[im 29/64  lung]
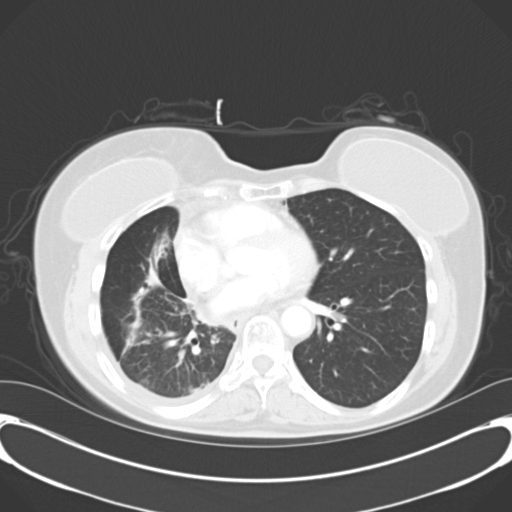
[im 36/64  lung]
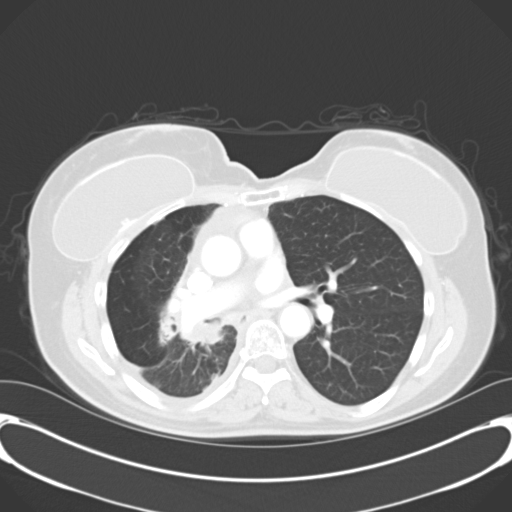
[im 40/64  lung]
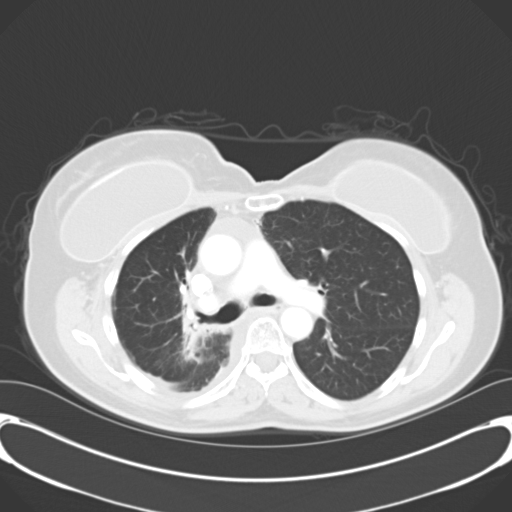
[im 45/64  mediastinal]
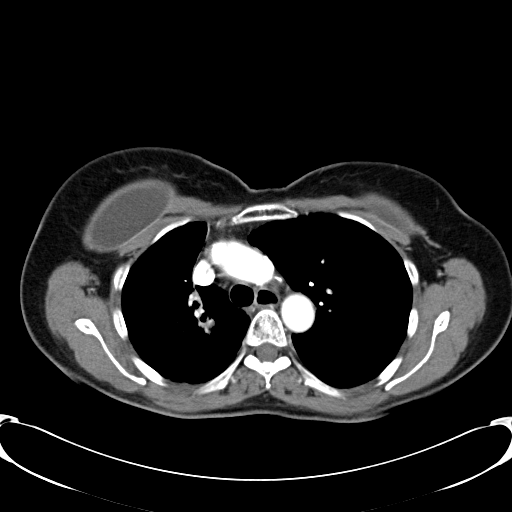
[im 45/64  lung]
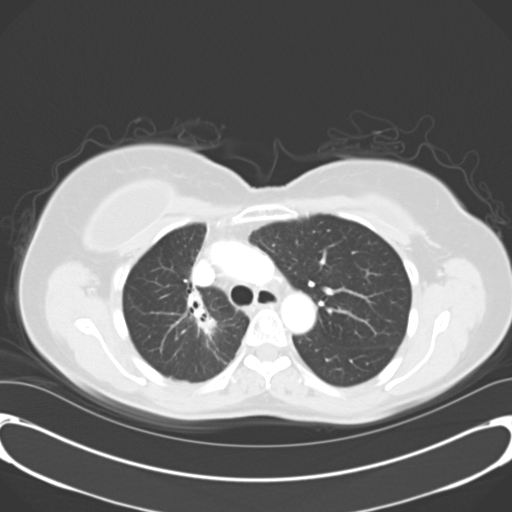
[im 50/64  lung]
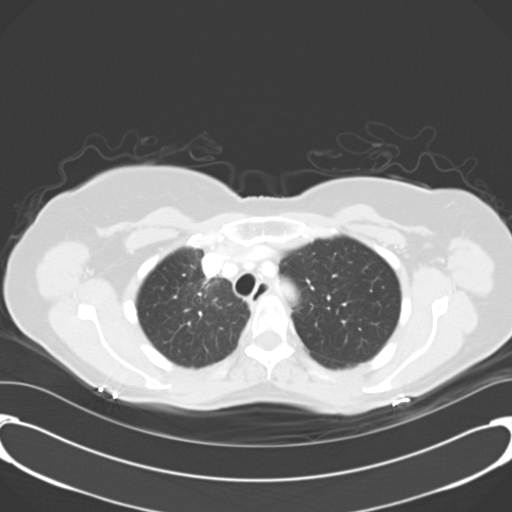
[im 54/64  lung]
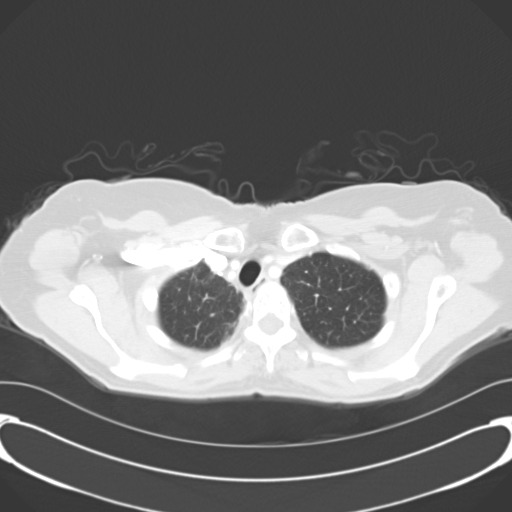
[im 59/64  lung]
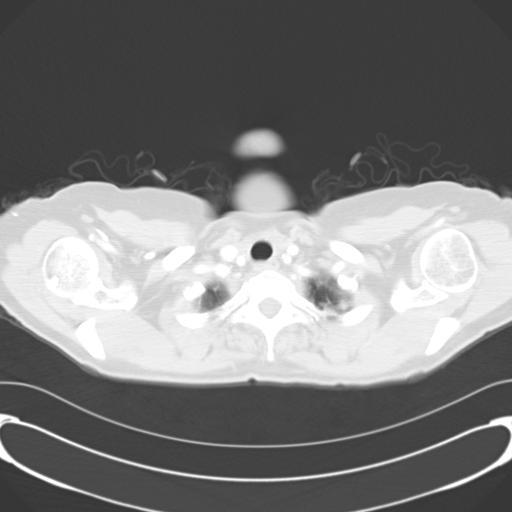

[Series 602: <mpr thick range> · coronal · 0.74mm/px · 3 of 82 slices shown]
[im 17/82  lung]
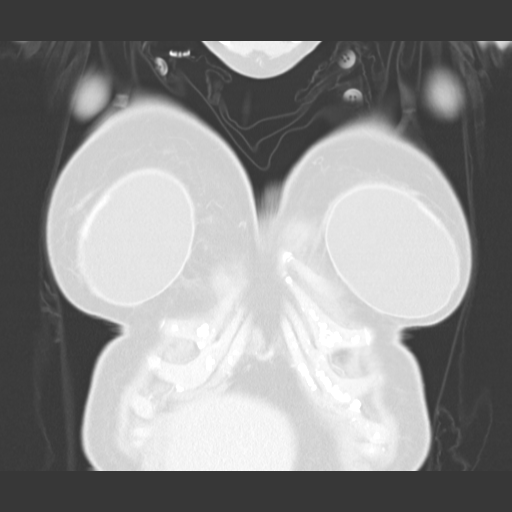
[im 33/82  lung]
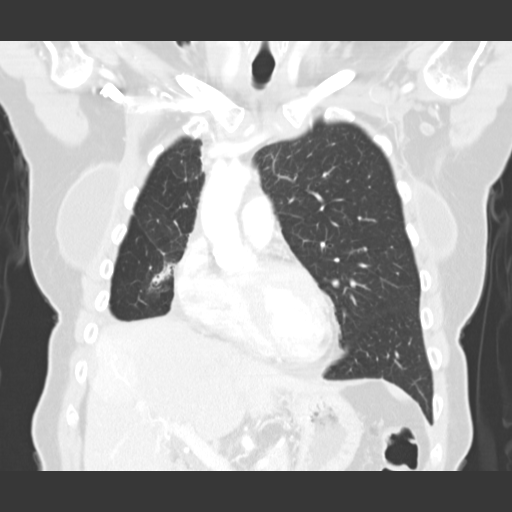
[im 49/82  lung]
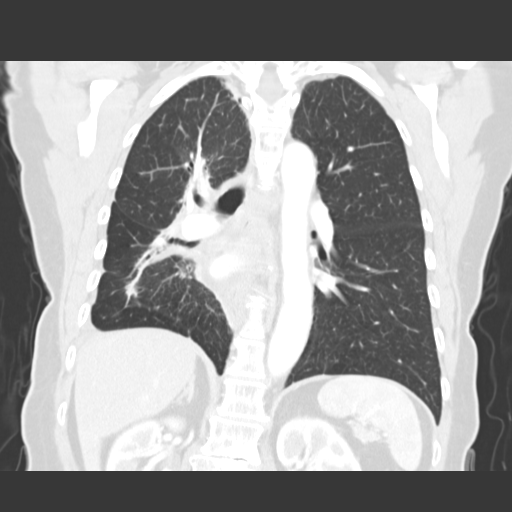

[15 of 36 positions shown; findings below may reference images not displayed]

FINDINGS: Mediastinum/Nodes: Normal heart size. Stable mild pericardial fluid/
thickening. There is atherosclerosis of the thoracic aorta, the
great vessels of the mediastinum and the coronary arteries,
including calcified atherosclerotic plaque in the left anterior
descending coronary artery. Great vessels are normal in course and
caliber. No central pulmonary emboli. Status post thyroidectomy.
Normal esophagus. No axillary, mediastinal or hilar lymphadenopathy.

Lungs/Pleura: No pneumothorax. Small basilar/dependent right pleural
effusion, slightly decreased. No left pleural effusion. There is
stable sharply marginated consolidation with associated
bronchiectasis in the central right upper and middle lobes, in
keeping with stable radiation change. There is interval growth of a
central right lower lobe 2.7 x 2.1 cm nodule (series 2/ image 31),
previously 1.8 x 1.3 cm, which focally occludes the right mainstem
bronchus. Mild curvilinear bands and patchy ground-glass opacity in
the right lower lobe likely represents a combination of
postobstructive pneumonitis and atelectasis.

Upper abdomen: Stable 1.0 cm hypervascular lesion in segment 8 of
the right liver lobe (series 2/image 40), with surrounding
subsegmental liver hyper enhancement, likely a benign hypervascular
lesion given the absence of liver hypermetabolism in this location
on the 04/19/2014 PET-CT study.

Musculoskeletal: No aggressive appearing focal osseous lesions.
Intact appearing bilateral breast prostheses. Mild-to-moderate
degenerative changes in the visualized thoracolumbar spine.
IMPRESSION: 1. Interval progression of 2.7 cm central right lower lobe pulmonary
nodule, which focally occludes the right mainstem bronchus. No
additional sites of metastatic disease in the chest.
2. Right lower lobe patchy ground-glass opacity and mild curvilinear
bands, in keeping with a combination of postobstructive pneumonitis
and atelectasis.
3. Stable radiation change in the central right middle and upper
lobes.
4. Stable small hypervascular right liver lesion, likely benign.
5. Atherosclerosis, including 1 vessel coronary artery disease.
Please note that although the presence of coronary artery calcium
documents the presence of coronary artery disease, the severity of
this disease and any potential stenosis cannot be assessed on this
non-gated CT examination.

## 2015-11-06 ENCOUNTER — Encounter (HOSPITAL_COMMUNITY): Payer: Medicare Other

## 2015-11-08 ENCOUNTER — Encounter (HOSPITAL_COMMUNITY): Payer: Medicare Other

## 2015-11-08 ENCOUNTER — Other Ambulatory Visit: Payer: Medicare Other

## 2015-11-08 ENCOUNTER — Ambulatory Visit: Payer: Medicare Other

## 2015-11-13 ENCOUNTER — Other Ambulatory Visit (HOSPITAL_BASED_OUTPATIENT_CLINIC_OR_DEPARTMENT_OTHER): Payer: Medicare Other

## 2015-11-13 ENCOUNTER — Ambulatory Visit (HOSPITAL_BASED_OUTPATIENT_CLINIC_OR_DEPARTMENT_OTHER): Payer: Medicare Other

## 2015-11-13 ENCOUNTER — Ambulatory Visit (HOSPITAL_BASED_OUTPATIENT_CLINIC_OR_DEPARTMENT_OTHER): Payer: Medicare Other | Admitting: Internal Medicine

## 2015-11-13 ENCOUNTER — Encounter: Payer: Self-pay | Admitting: Internal Medicine

## 2015-11-13 ENCOUNTER — Encounter (HOSPITAL_COMMUNITY): Payer: Medicare Other

## 2015-11-13 VITALS — BP 147/76 | HR 117 | Temp 98.2°F | Resp 18 | Ht 68.0 in | Wt 154.1 lb

## 2015-11-13 DIAGNOSIS — R51 Headache: Secondary | ICD-10-CM

## 2015-11-13 DIAGNOSIS — R519 Headache, unspecified: Secondary | ICD-10-CM

## 2015-11-13 DIAGNOSIS — Z5111 Encounter for antineoplastic chemotherapy: Secondary | ICD-10-CM

## 2015-11-13 DIAGNOSIS — C3491 Malignant neoplasm of unspecified part of right bronchus or lung: Secondary | ICD-10-CM

## 2015-11-13 DIAGNOSIS — C3431 Malignant neoplasm of lower lobe, right bronchus or lung: Secondary | ICD-10-CM

## 2015-11-13 DIAGNOSIS — E039 Hypothyroidism, unspecified: Secondary | ICD-10-CM

## 2015-11-13 DIAGNOSIS — Z85850 Personal history of malignant neoplasm of thyroid: Secondary | ICD-10-CM

## 2015-11-13 DIAGNOSIS — D6481 Anemia due to antineoplastic chemotherapy: Secondary | ICD-10-CM | POA: Diagnosis not present

## 2015-11-13 DIAGNOSIS — R0602 Shortness of breath: Secondary | ICD-10-CM | POA: Diagnosis not present

## 2015-11-13 DIAGNOSIS — R05 Cough: Secondary | ICD-10-CM

## 2015-11-13 HISTORY — DX: Headache, unspecified: R51.9

## 2015-11-13 LAB — COMPREHENSIVE METABOLIC PANEL
ALBUMIN: 3.2 g/dL — AB (ref 3.5–5.0)
ALK PHOS: 179 U/L — AB (ref 40–150)
ALT: 32 U/L (ref 0–55)
ANION GAP: 10 meq/L (ref 3–11)
AST: 29 U/L (ref 5–34)
BUN: 14 mg/dL (ref 7.0–26.0)
CO2: 24 mEq/L (ref 22–29)
Calcium: 10.1 mg/dL (ref 8.4–10.4)
Chloride: 107 mEq/L (ref 98–109)
Creatinine: 0.8 mg/dL (ref 0.6–1.1)
EGFR: 78 mL/min/{1.73_m2} — AB (ref >90)
GLUCOSE: 93 mg/dL (ref 70–140)
POTASSIUM: 4.9 meq/L (ref 3.5–5.1)
Sodium: 141 mEq/L (ref 136–145)
Total Bilirubin: 0.31 mg/dL (ref 0.20–1.20)
Total Protein: 7.3 g/dL (ref 6.4–8.3)

## 2015-11-13 LAB — CBC WITH DIFFERENTIAL/PLATELET
BASO%: 1.1 % (ref 0.0–2.0)
Basophils Absolute: 0.1 10*3/uL (ref 0.0–0.1)
EOS ABS: 0.1 10*3/uL (ref 0.0–0.5)
EOS%: 0.9 % (ref 0.0–7.0)
HEMATOCRIT: 32.4 % — AB (ref 34.8–46.6)
HGB: 10.7 g/dL — ABNORMAL LOW (ref 11.6–15.9)
LYMPH#: 1.4 10*3/uL (ref 0.9–3.3)
LYMPH%: 19.3 % (ref 14.0–49.7)
MCH: 30 pg (ref 25.1–34.0)
MCHC: 32.9 g/dL (ref 31.5–36.0)
MCV: 91.1 fL (ref 79.5–101.0)
MONO#: 1.5 10*3/uL — AB (ref 0.1–0.9)
MONO%: 20.2 % — ABNORMAL HIGH (ref 0.0–14.0)
NEUT#: 4.2 10*3/uL (ref 1.5–6.5)
NEUT%: 58.5 % (ref 38.4–76.8)
PLATELETS: 623 10*3/uL — AB (ref 145–400)
RBC: 3.56 10*6/uL — ABNORMAL LOW (ref 3.70–5.45)
RDW: 13.8 % (ref 11.2–14.5)
WBC: 7.2 10*3/uL (ref 3.9–10.3)

## 2015-11-13 LAB — TSH: TSH: 2.39 m(IU)/L (ref 0.308–3.960)

## 2015-11-13 MED ORDER — SODIUM CHLORIDE 0.9 % IV SOLN
Freq: Once | INTRAVENOUS | Status: AC
  Administered 2015-11-13: 12:00:00 via INTRAVENOUS

## 2015-11-13 MED ORDER — PALONOSETRON HCL INJECTION 0.25 MG/5ML
INTRAVENOUS | Status: AC
  Filled 2015-11-13: qty 5

## 2015-11-13 MED ORDER — SODIUM CHLORIDE 0.9 % IV SOLN
10.0000 mg | Freq: Once | INTRAVENOUS | Status: AC
  Administered 2015-11-13: 10 mg via INTRAVENOUS
  Filled 2015-11-13: qty 1

## 2015-11-13 MED ORDER — HEPARIN SOD (PORK) LOCK FLUSH 100 UNIT/ML IV SOLN
500.0000 [IU] | Freq: Once | INTRAVENOUS | Status: AC | PRN
  Administered 2015-11-13: 500 [IU]
  Filled 2015-11-13: qty 5

## 2015-11-13 MED ORDER — SODIUM CHLORIDE 0.9 % IV SOLN
1000.0000 mg/m2 | Freq: Once | INTRAVENOUS | Status: AC
  Administered 2015-11-13: 1824 mg via INTRAVENOUS
  Filled 2015-11-13: qty 47.97

## 2015-11-13 MED ORDER — SODIUM CHLORIDE 0.9% FLUSH
10.0000 mL | INTRAVENOUS | Status: DC | PRN
  Administered 2015-11-13: 10 mL
  Filled 2015-11-13: qty 10

## 2015-11-13 MED ORDER — SODIUM CHLORIDE 0.9 % IV SOLN
427.0000 mg | Freq: Once | INTRAVENOUS | Status: AC
  Administered 2015-11-13: 430 mg via INTRAVENOUS
  Filled 2015-11-13: qty 43

## 2015-11-13 MED ORDER — PALONOSETRON HCL INJECTION 0.25 MG/5ML
0.2500 mg | Freq: Once | INTRAVENOUS | Status: AC
  Administered 2015-11-13: 0.25 mg via INTRAVENOUS

## 2015-11-13 NOTE — Progress Notes (Signed)
[facility] Telephone:([phone]   Fax:([phone]) [place], MD [address] [place] [address]  DIAGNOSIS: Progressive non-small cell lung cancer initially diagnosed as Stage IIIA (T3, N2, M0) non-small cell lung cancer, squamous cell carcinoma diagnosed in September of 2015 presenting with central right lower lobe lung mass with postobstructive pneumonitis and mediastinal lymph nodes, was also a suspicious small nodule in the right upper lobe.  PRIOR THERAPY:  2) Status post a course of concurrent chemoradiation with weekly carboplatin for AUC of 2 and paclitaxel 45 MG/M2, last dose was given 06/12/2014 with partial response. 2) Tecentriq (Atezolizumab) 1200 mg IV every 3 weeks. First dose 06/14/2015. Status post 5 cycles discontinued secondary to disease progression  CURRENT THERAPY: Systemic chemotherapy was carboplatin for AUC of 5 on day 1 and gemcitabine 1000 MG/M2 on days 1 and 8 every 3 weeks. Status post one cycle.  INTERVAL HISTORY: [name] 70 y.o. female returns to the clinic today for follow-up visit accompanied by her husband.  She denied having any significant nausea or vomiting. She has no diarrhea. She denied having any significant skin rash. The patient has no chest pain but continues to have mild shortness of breath and dry cough with no hemoptysis.  She tolerated the first cycle of her systemic chemotherapy with carboplatin and gemcitabine fairly well except for fatigue and persistent headaches started a week ago. She denied having any significant fever or chills. She is here today to start cycle #2 of her treatment.  MEDICAL HISTORY: Past Medical History  Diagnosis Date  . Thyroid disease     hypothyroidism  . Hx of thyroid cancer   . Status post bilateral breast implants   . Consolidation lung ([facility]) 03/21/14    RLL PER CXR/CT  . Hypertension     just started,not on medication  . Shortness of  breath     exertion  . Hypothyroidism   . Mitral valve prolapse   . Anxiety   . Arthritis   . Fibromyalgia   . Complication of anesthesia     headaches,very agitated while waking  up  . Cancer ([facility])     thyroid  . Lung cancer ([facility])     2015  . Encounter for antineoplastic immunotherapy 07/05/2015  . Hypothyroidism 07/26/2015    ALLERGIES:  is allergic to succinylcholine and niacin and related.  MEDICATIONS:  Current Outpatient Prescriptions  Medication Sig Dispense Refill  . acetaminophen (TYLENOL) 325 MG tablet Take 650 mg by mouth every 6 (six) hours as needed.    Marland Kitchen albuterol (PROVENTIL) (2.5 MG/3ML) 0.083% nebulizer solution Take 2.5 mg by nebulization every 6 (six) hours as needed for wheezing or shortness of breath.    Marland Kitchen amLODipine (NORVASC) 5 MG tablet Take 5 mg by mouth daily.   4  . aspirin EC 81 MG tablet Take 162 mg by mouth at bedtime.     . clonazePAM (KLONOPIN) 0.5 MG tablet One three times a day with meals and 2 at bedtime (Patient taking differently: Take 0.5 mg by mouth as needed for anxiety. ) 75 tablet 0  . estradiol (ESTRACE) 0.5 MG tablet Take 0.5 mg by mouth daily.    . famotidine (PEPCID) 20 MG tablet Take by mouth.    Marland Kitchen FLUoxetine (PROZAC) 20 MG tablet Take by mouth.    Marland Kitchen HYDROcodone-homatropine (HYCODAN) 5-1.5 MG/5ML syrup Take 5 mLs by mouth every 6 (six) hours as needed for  cough. 473 mL 0  . ibuprofen (ADVIL,MOTRIN) 100 MG tablet Take 200 mg by mouth every 6 (six) hours as needed for fever.    . levothyroxine (SYNTHROID, LEVOTHROID) 125 MCG tablet Take 137 mcg by mouth daily before breakfast.     . lidocaine-prilocaine (EMLA) cream Apply 1 application topically as needed. 30 g 2  . medroxyPROGESTERone (PROVERA) 2.5 MG tablet Take 2.5 mg by mouth daily.    Marland Kitchen morphine (MSIR) 15 MG tablet Take 1 tablet (15 mg total) by mouth every 6 (six) hours as needed for severe pain (pain). 45 tablet 0  . prochlorperazine (COMPAZINE) 10 MG tablet Take 1 tablet (10 mg  total) by mouth every 6 (six) hours as needed for nausea or vomiting. 30 tablet 2  . sodium chloride HYPERTONIC 3 % nebulizer solution Inhale into the lungs.     No current facility-administered medications for this visit.    SURGICAL HISTORY:  Past Surgical History  Procedure Laterality Date  . Thyroidectomy    . Cataract extraction Right   . Breast enhancement surgery    . Retinal detachment surgery    . Eye surgery    . Pars plana vitrectomy w/ repair of macular hole    . Back surgery      times 2  . Tonsillectomy    . Cervical cone biopsy    . Video bronchoscopy with endobronchial ultrasound N/A 04/07/2014    Procedure: VIDEO BRONCHOSCOPY WITH ENDOBRONCHIAL ULTRASOUND;  Surgeon: Melrose Nakayama, MD;  Location: [facility];  Service: Thoracic;  Laterality: N/A;    REVIEW OF SYSTEMS:  A comprehensive review of systems was negative except for: Constitutional: positive for fatigue Respiratory: positive for cough, dyspnea on exertion and sputum Neurological: positive for headaches   PHYSICAL EXAMINATION: General appearance: alert, cooperative, fatigued and no distress Head: Normocephalic, without obvious abnormality, atraumatic Neck: no adenopathy, no JVD, supple, symmetrical, trachea midline and thyroid not enlarged, symmetric, no tenderness/mass/nodules Lymph nodes: Cervical, supraclavicular, and axillary nodes normal. Resp: wheezes bilaterally Back: symmetric, no curvature. ROM normal. No CVA tenderness. Cardio: regular rate and rhythm, S1, S2 normal, no murmur, click, rub or gallop GI: soft, non-tender; bowel sounds normal; no masses,  no organomegaly Extremities: extremities normal, atraumatic, no cyanosis or edema Neurologic: Alert and oriented X 3, normal strength and tone. Normal symmetric reflexes. Normal coordination and gait  ECOG PERFORMANCE STATUS: 1 - Symptomatic but completely ambulatory  Blood pressure 147/76, pulse 117, temperature 98.2 F (36.8 C), temperature  source Oral, resp. rate 18, height '5\' 8"'$  (1.727 m), weight 154 lb 1.6 oz (69.899 kg), SpO2 96 %.  LABORATORY DATA: Lab Results  Component Value Date   WBC 7.2 11/13/2015   HGB 10.7* 11/13/2015   HCT 32.4* 11/13/2015   MCV 91.1 11/13/2015   PLT 623* 11/13/2015      Chemistry      Component Value Date/Time   NA 140 10/25/2015 1025   NA 138 07/28/2014 1611   K 4.8 10/25/2015 1025   K 3.9 07/28/2014 1611   CL 105 07/28/2014 1611   CO2 25 10/25/2015 1025   CO2 26 07/28/2014 1611   BUN 12.2 10/25/2015 1025   BUN 12 07/28/2014 1611   CREATININE 0.7 10/25/2015 1025   CREATININE 0.53 07/28/2014 1611      Component Value Date/Time   CALCIUM 9.2 10/25/2015 1025   CALCIUM 9.3 07/28/2014 1611   ALKPHOS 125 10/25/2015 1025   ALKPHOS 197* 04/05/2014 1336   AST 27 10/25/2015 1025  AST 18 04/05/2014 1336   ALT 33 10/25/2015 1025   ALT 33 04/05/2014 1336   BILITOT 0.36 10/25/2015 1025   BILITOT 0.3 04/05/2014 1336       RADIOGRAPHIC STUDIES: Ir Fluoro Guide Cv Line Right  10/24/2015  INDICATION: History of lung cancer. In need of durable intravenous access for chemotherapy administration. EXAM: IMPLANTED PORT A CATH PLACEMENT WITH ULTRASOUND AND FLUOROSCOPIC GUIDANCE COMPARISON:  None. MEDICATIONS: Ancef 2 gm IV; The antibiotic was administered within an appropriate time interval prior to skin puncture. ANESTHESIA/SEDATION: Moderate (conscious) sedation was employed during this procedure. A total of Versed 3 mg and Fentanyl 100 mcg was administered intravenously. Moderate Sedation Time: 27 minutes. The patient's level of consciousness and vital signs were monitored continuously by radiology nursing throughout the procedure under my direct supervision. CONTRAST:  None FLUOROSCOPY TIME:  30 seconds (1.7 mGy) COMPLICATIONS: None immediate. PROCEDURE: The procedure, risks, benefits, and alternatives were explained to the patient. Questions regarding the procedure were encouraged and answered.  The patient understands and consents to the procedure. The right neck and chest were prepped with chlorhexidine in a sterile fashion, and a sterile drape was applied covering the operative field. Maximum barrier sterile technique with sterile gowns and gloves were used for the procedure. A timeout was performed prior to the initiation of the procedure. Local anesthesia was provided with 1% lidocaine with epinephrine. After creating a small venotomy incision, a micropuncture kit was utilized to access the internal jugular vein under direct, real-time ultrasound guidance. Ultrasound image documentation was performed. The microwire was kinked to measure appropriate catheter length. A subcutaneous port pocket was then created along the upper chest wall utilizing a combination of sharp and blunt dissection. No, special attention was made to avoid the superior aspect of the patient's right-sided breast prosthesis. The pocket was irrigated with sterile saline. A single lumen ISP power injectable port was chosen for placement. The 8 Fr catheter was tunneled from the port pocket site to the venotomy incision. The port was placed in the pocket. The external catheter was trimmed to appropriate length. At the venotomy, an 8 Fr peel-away sheath was placed over a guidewire under fluoroscopic guidance. The catheter was then placed through the sheath and the sheath was removed. Final catheter positioning was confirmed and documented with a fluoroscopic spot radiograph. The port was accessed with a Huber needle, aspirated and flushed with heparinized saline. The venotomy site was closed with an interrupted 4-0 Vicryl suture. The port pocket incision was closed with interrupted 2-0 Vicryl suture and the skin was opposed with a running subcuticular 4-0 Vicryl suture. Dermabond and Steri-strips were applied to both incisions. Dressings were placed. The patient tolerated the procedure well without immediate post procedural complication.  FINDINGS: After catheter placement, the tip lies within the superior cavoatrial junction. The catheter aspirates and flushes normally and is ready for immediate use. IMPRESSION: Successful placement of a right internal jugular approach power injectable Port-A-Cath. The catheter is ready for immediate use. Electronically Signed   By: Simonne Come M.D.   On: 10/24/2015 13:12   Ir US Guide Vasc Access Right  10/24/2015  INDICATION: History of lung cancer. In need of durable intravenous access for chemotherapy administration. EXAM: IMPLANTED PORT A CATH PLACEMENT WITH ULTRASOUND AND FLUOROSCOPIC GUIDANCE COMPARISON:  None. MEDICATIONS: Ancef 2 gm IV; The antibiotic was administered within an appropriate time interval prior to skin puncture. ANESTHESIA/SEDATION: Moderate (conscious) sedation was employed during this procedure. A total of Versed 3 mg and  Fentanyl 100 mcg was administered intravenously. Moderate Sedation Time: 27 minutes. The patient's level of consciousness and vital signs were monitored continuously by radiology nursing throughout the procedure under my direct supervision. CONTRAST:  None FLUOROSCOPY TIME:  30 seconds (1.7 mGy) COMPLICATIONS: None immediate. PROCEDURE: The procedure, risks, benefits, and alternatives were explained to the patient. Questions regarding the procedure were encouraged and answered. The patient understands and consents to the procedure. The right neck and chest were prepped with chlorhexidine in a sterile fashion, and a sterile drape was applied covering the operative field. Maximum barrier sterile technique with sterile gowns and gloves were used for the procedure. A timeout was performed prior to the initiation of the procedure. Local anesthesia was provided with 1% lidocaine with epinephrine. After creating a small venotomy incision, a micropuncture kit was utilized to access the internal jugular vein under direct, real-time ultrasound guidance. Ultrasound image  documentation was performed. The microwire was kinked to measure appropriate catheter length. A subcutaneous port pocket was then created along the upper chest wall utilizing a combination of sharp and blunt dissection. No, special attention was made to avoid the superior aspect of the patient's right-sided breast prosthesis. The pocket was irrigated with sterile saline. A single lumen ISP power injectable port was chosen for placement. The 8 Fr catheter was tunneled from the port pocket site to the venotomy incision. The port was placed in the pocket. The external catheter was trimmed to appropriate length. At the venotomy, an 8 Fr peel-away sheath was placed over a guidewire under fluoroscopic guidance. The catheter was then placed through the sheath and the sheath was removed. Final catheter positioning was confirmed and documented with a fluoroscopic spot radiograph. The port was accessed with a Huber needle, aspirated and flushed with heparinized saline. The venotomy site was closed with an interrupted 4-0 Vicryl suture. The port pocket incision was closed with interrupted 2-0 Vicryl suture and the skin was opposed with a running subcuticular 4-0 Vicryl suture. Dermabond and Steri-strips were applied to both incisions. Dressings were placed. The patient tolerated the procedure well without immediate post procedural complication. FINDINGS: After catheter placement, the tip lies within the superior cavoatrial junction. The catheter aspirates and flushes normally and is ready for immediate use. IMPRESSION: Successful placement of a right internal jugular approach power injectable Port-A-Cath. The catheter is ready for immediate use. Electronically Signed   By: Sandi Mariscal M.D.   On: 10/24/2015 13:12    ASSESSMENT AND PLAN: This is a very pleasant 70 years old white female with history of stage IIIA non-small cell lung cancer status post a course of concurrent chemoradiation with weekly carboplatin and  paclitaxel with significant improvement. She has been observation since November 2015. She was found to have disease progression on restaging his scan. The patient was started on treatment with Tecentriq Huey Bienenstock) status post 5 cycles and tolerated it fairly well. Unfortunately the recent CT scan of the chest showed further increase in the size of the right lower lobe lung mass. I discussed the scan results with the patient and her husband. This represent a second progression and most likely true progression on the immunotherapy. I recommended for the patient to discontinue her treatment with Tecentriq (Atezolizumab) at this point. She is currently undergoing systemic chemotherapy with carboplatin for AUC of 5 on day 1 and gemcitabine 1000 MG/M2 on days 1 and 8 every 3 weeks. She is status post 1 cycle. She tolerated the first cycle well except for mild fatigue secondary  to chemotherapy-induced anemia. For the headache, I will order CT scan of the head to rule out brain metastasis. For the hypothyroidism,  her TSH is better today. the patient will continue her treatment with levothyroxine as prescribed by her primary care physician. The patient would come back for follow-up visit in 3 weeks for evaluation before starting cycle #3. She was advised to call immediately if she has any concerning symptoms in the interval. The patient voices understanding of current disease status and treatment options and is in agreement with the current care plan.  All questions were answered. The patient knows to call the clinic with any problems, questions or concerns. We can certainly see the patient much sooner if necessary.  Disclaimer: This note was dictated with voice recognition software. Similar sounding words can inadvertently be transcribed and may not be corrected upon review.

## 2015-11-15 ENCOUNTER — Ambulatory Visit: Payer: Medicare Other

## 2015-11-15 ENCOUNTER — Encounter (HOSPITAL_COMMUNITY): Payer: Medicare Other

## 2015-11-15 ENCOUNTER — Other Ambulatory Visit: Payer: Medicare Other

## 2015-11-16 ENCOUNTER — Ambulatory Visit: Payer: Medicare Other | Admitting: Cardiovascular Disease

## 2015-11-19 ENCOUNTER — Ambulatory Visit (HOSPITAL_COMMUNITY)
Admission: RE | Admit: 2015-11-19 | Discharge: 2015-11-19 | Disposition: A | Discharge status: Home / Self Care | Payer: Medicare Other | Source: Ambulatory Visit | Attending: Internal Medicine | Admitting: Internal Medicine

## 2015-11-19 DIAGNOSIS — R51 Headache: Secondary | ICD-10-CM | POA: Diagnosis present

## 2015-11-19 DIAGNOSIS — I6782 Cerebral ischemia: Secondary | ICD-10-CM | POA: Insufficient documentation

## 2015-11-19 DIAGNOSIS — R519 Headache, unspecified: Secondary | ICD-10-CM

## 2015-11-19 DIAGNOSIS — Z9221 Personal history of antineoplastic chemotherapy: Secondary | ICD-10-CM | POA: Diagnosis not present

## 2015-11-19 DIAGNOSIS — C3491 Malignant neoplasm of unspecified part of right bronchus or lung: Secondary | ICD-10-CM | POA: Diagnosis not present

## 2015-11-19 DIAGNOSIS — Z5111 Encounter for antineoplastic chemotherapy: Secondary | ICD-10-CM

## 2015-11-19 MED ORDER — IOPAMIDOL (ISOVUE-300) INJECTION 61%
75.0000 mL | Freq: Once | INTRAVENOUS | Status: AC | PRN
  Administered 2015-11-19: 75 mL via INTRAVENOUS

## 2015-11-20 ENCOUNTER — Other Ambulatory Visit (HOSPITAL_BASED_OUTPATIENT_CLINIC_OR_DEPARTMENT_OTHER): Payer: Medicare Other

## 2015-11-20 ENCOUNTER — Ambulatory Visit (HOSPITAL_BASED_OUTPATIENT_CLINIC_OR_DEPARTMENT_OTHER): Payer: Medicare Other

## 2015-11-20 VITALS — BP 107/82 | HR 109 | Temp 98.7°F | Resp 18

## 2015-11-20 DIAGNOSIS — C3491 Malignant neoplasm of unspecified part of right bronchus or lung: Secondary | ICD-10-CM

## 2015-11-20 DIAGNOSIS — Z5111 Encounter for antineoplastic chemotherapy: Secondary | ICD-10-CM

## 2015-11-20 LAB — CBC WITH DIFFERENTIAL/PLATELET
BASO%: 0.4 % (ref 0.0–2.0)
BASOS ABS: 0 10*3/uL (ref 0.0–0.1)
EOS ABS: 0 10*3/uL (ref 0.0–0.5)
EOS%: 0.2 % (ref 0.0–7.0)
HCT: 32.6 % — ABNORMAL LOW (ref 34.8–46.6)
HGB: 10.9 g/dL — ABNORMAL LOW (ref 11.6–15.9)
LYMPH%: 12.8 % — AB (ref 14.0–49.7)
MCH: 30.2 pg (ref 25.1–34.0)
MCHC: 33.4 g/dL (ref 31.5–36.0)
MCV: 90.3 fL (ref 79.5–101.0)
MONO#: 1.2 10*3/uL — ABNORMAL HIGH (ref 0.1–0.9)
MONO%: 11.9 % (ref 0.0–14.0)
NEUT%: 74.7 % (ref 38.4–76.8)
NEUTROS ABS: 7.7 10*3/uL — AB (ref 1.5–6.5)
PLATELETS: 414 10*3/uL — AB (ref 145–400)
RBC: 3.61 10*6/uL — AB (ref 3.70–5.45)
RDW: 13.6 % (ref 11.2–14.5)
WBC: 10.4 10*3/uL — AB (ref 3.9–10.3)
lymph#: 1.3 10*3/uL (ref 0.9–3.3)
nRBC: 0 % (ref 0–0)

## 2015-11-20 LAB — COMPREHENSIVE METABOLIC PANEL
ALT: 49 U/L (ref 0–55)
AST: 39 U/L — AB (ref 5–34)
Albumin: 3.2 g/dL — ABNORMAL LOW (ref 3.5–5.0)
Alkaline Phosphatase: 170 U/L — ABNORMAL HIGH (ref 40–150)
Anion Gap: 12 mEq/L — ABNORMAL HIGH (ref 3–11)
BUN: 12.7 mg/dL (ref 7.0–26.0)
CHLORIDE: 103 meq/L (ref 98–109)
CO2: 24 meq/L (ref 22–29)
CREATININE: 0.8 mg/dL (ref 0.6–1.1)
Calcium: 9.6 mg/dL (ref 8.4–10.4)
EGFR: 77 mL/min/{1.73_m2} — ABNORMAL LOW (ref >90)
Glucose: 95 mg/dl (ref 70–140)
Potassium: 3.8 mEq/L (ref 3.5–5.1)
SODIUM: 138 meq/L (ref 136–145)
Total Bilirubin: 0.3 mg/dL (ref 0.20–1.20)
Total Protein: 7.2 g/dL (ref 6.4–8.3)

## 2015-11-20 LAB — TECHNOLOGIST REVIEW

## 2015-11-20 MED ORDER — PROCHLORPERAZINE MALEATE 10 MG PO TABS
ORAL_TABLET | ORAL | Status: AC
  Filled 2015-11-20: qty 1

## 2015-11-20 MED ORDER — SODIUM CHLORIDE 0.9% FLUSH
10.0000 mL | INTRAVENOUS | Status: DC | PRN
  Administered 2015-11-20: 10 mL
  Filled 2015-11-20: qty 10

## 2015-11-20 MED ORDER — PROCHLORPERAZINE MALEATE 10 MG PO TABS
10.0000 mg | ORAL_TABLET | Freq: Once | ORAL | Status: AC
  Administered 2015-11-20: 10 mg via ORAL

## 2015-11-20 MED ORDER — HEPARIN SOD (PORK) LOCK FLUSH 100 UNIT/ML IV SOLN
500.0000 [IU] | Freq: Once | INTRAVENOUS | Status: AC | PRN
  Administered 2015-11-20: 500 [IU]
  Filled 2015-11-20: qty 5

## 2015-11-20 MED ORDER — SODIUM CHLORIDE 0.9 % IV SOLN
Freq: Once | INTRAVENOUS | Status: AC
  Administered 2015-11-20: 11:00:00 via INTRAVENOUS

## 2015-11-20 MED ORDER — SODIUM CHLORIDE 0.9 % IV SOLN
1000.0000 mg/m2 | Freq: Once | INTRAVENOUS | Status: AC
  Administered 2015-11-20: 1824 mg via INTRAVENOUS
  Filled 2015-11-20: qty 47.97

## 2015-11-20 NOTE — Progress Notes (Signed)
CBC/Cmet reviewed by MD " Ok to treat"

## 2015-11-22 ENCOUNTER — Ambulatory Visit: Payer: Medicare Other | Admitting: Internal Medicine

## 2015-11-22 ENCOUNTER — Other Ambulatory Visit: Payer: Medicare Other

## 2015-11-22 ENCOUNTER — Ambulatory Visit: Payer: Medicare Other

## 2015-11-23 ENCOUNTER — Telehealth: Payer: Self-pay | Admitting: Medical Oncology

## 2015-11-23 ENCOUNTER — Other Ambulatory Visit: Payer: Self-pay | Admitting: Medical Oncology

## 2015-11-23 DIAGNOSIS — C3491 Malignant neoplasm of unspecified part of right bronchus or lung: Secondary | ICD-10-CM

## 2015-11-23 DIAGNOSIS — Z85850 Personal history of malignant neoplasm of thyroid: Secondary | ICD-10-CM

## 2015-11-23 MED ORDER — HYDROCODONE-HOMATROPINE 5-1.5 MG/5ML PO SYRP: 5.0000 mL | ORAL_SOLUTION | Freq: Four times a day (QID) | ORAL | Status: AC | PRN

## 2015-11-23 MED ORDER — HYDROCODONE-HOMATROPINE 5-1.5 MG/5ML PO SYRP: 5.0000 mL | ORAL_SOLUTION | Freq: Four times a day (QID) | ORAL | Status: DC | PRN

## 2015-11-23 NOTE — Telephone Encounter (Signed)
rx locked in pod 3 for Mohamed to sign

## 2015-11-23 NOTE — Progress Notes (Signed)
Requests refill on hycodan. I called it to her pharmacy.

## 2015-11-26 ENCOUNTER — Encounter (HOSPITAL_COMMUNITY)
Admission: RE | Admit: 2015-11-26 | Discharge: 2015-11-26 | Disposition: A | Discharge status: Home / Self Care | Payer: Medicare Other | Source: Ambulatory Visit | Attending: Cardiovascular Disease | Admitting: Cardiovascular Disease

## 2015-11-26 ENCOUNTER — Inpatient Hospital Stay (HOSPITAL_COMMUNITY): Admission: RE | Admit: 2015-11-26 | Payer: Medicare Other | Source: Ambulatory Visit

## 2015-11-26 ENCOUNTER — Encounter (HOSPITAL_COMMUNITY): Payer: Self-pay

## 2015-11-26 DIAGNOSIS — I2510 Atherosclerotic heart disease of native coronary artery without angina pectoris: Secondary | ICD-10-CM | POA: Insufficient documentation

## 2015-11-26 DIAGNOSIS — R931 Abnormal findings on diagnostic imaging of heart and coronary circulation: Secondary | ICD-10-CM | POA: Diagnosis not present

## 2015-11-26 LAB — NM MYOCAR MULTI W/SPECT W/WALL MOTION / EF
CHL CUP NUCLEAR SDS: 5
CHL CUP NUCLEAR SRS: 2
CHL CUP RESTING HR STRESS: 101 {beats}/min
LV sys vol: 21 mL
LVDIAVOL: 47 mL (ref 46–106)
NUC STRESS TID: 1.01
Peak HR: 112 {beats}/min
RATE: 0.57
SSS: 7

## 2015-11-26 MED ORDER — SODIUM CHLORIDE 0.9% FLUSH
INTRAVENOUS | Status: AC
  Administered 2015-11-26: 10 mL via INTRAVENOUS
  Filled 2015-11-26: qty 10

## 2015-11-26 MED ORDER — REGADENOSON 0.4 MG/5ML IV SOLN
INTRAVENOUS | Status: AC
Start: 2015-11-26 — End: 2015-11-26
  Administered 2015-11-26: 0.4 mg via INTRAVENOUS
  Filled 2015-11-26: qty 5

## 2015-11-26 MED ORDER — TECHNETIUM TC 99M SESTAMIBI GENERIC - CARDIOLITE
10.0000 | Freq: Once | INTRAVENOUS | Status: AC | PRN
  Administered 2015-11-26: 10 via INTRAVENOUS

## 2015-11-26 MED ORDER — TECHNETIUM TC 99M SESTAMIBI - CARDIOLITE
30.0000 | Freq: Once | INTRAVENOUS | Status: AC | PRN
  Administered 2015-11-26: 12:00:00 30 via INTRAVENOUS

## 2015-11-27 ENCOUNTER — Telehealth: Payer: Self-pay | Admitting: Medical Oncology

## 2015-11-27 NOTE — Telephone Encounter (Signed)
Pt notified to pick up rx.

## 2015-11-28 ENCOUNTER — Encounter: Payer: Self-pay | Admitting: Cardiovascular Disease

## 2015-11-28 ENCOUNTER — Ambulatory Visit (INDEPENDENT_AMBULATORY_CARE_PROVIDER_SITE_OTHER): Payer: Medicare Other | Admitting: Cardiovascular Disease

## 2015-11-28 VITALS — BP 120/70 | HR 101 | Ht 68.0 in | Wt 151.0 lb

## 2015-11-28 DIAGNOSIS — Z8249 Family history of ischemic heart disease and other diseases of the circulatory system: Secondary | ICD-10-CM | POA: Diagnosis not present

## 2015-11-28 DIAGNOSIS — R000 Tachycardia, unspecified: Secondary | ICD-10-CM

## 2015-11-28 DIAGNOSIS — I10 Essential (primary) hypertension: Secondary | ICD-10-CM | POA: Diagnosis not present

## 2015-11-28 DIAGNOSIS — I2510 Atherosclerotic heart disease of native coronary artery without angina pectoris: Secondary | ICD-10-CM | POA: Diagnosis not present

## 2015-11-28 NOTE — Progress Notes (Signed)
Patient ID: [name], female   DOB: 1945-10-28, 70 y.o.   MRN: [id number]      SUBJECTIVE: The patient returns for follow-up after undergoing cardiovascular testing performed for the evaluation of coronary artery calcifications seen on chest CT. She underwent a low risk nuclear myocardial perfusion study on 11/26/15 with possible variable soft tissue attenuation, although a small region of ischemia could not entirely be excluded. Calculated LVEF 56%. There were no diagnostic ST segment abnormalities to indicate ischemia. Denies chest pain. Has been dealing with seasonal allergies. Has h/o hyperthyroidism and is due to have labs checked by Dr. Willey Blade next week. Mildly tachycardic today. Also uses albuterol.   Review of Systems: As per "subjective", otherwise negative.  Allergies  Allergen Reactions  . Succinylcholine Other (See Comments)    Prolonged muscle weakness   . Niacin And Related Rash    Current Outpatient Prescriptions  Medication Sig Dispense Refill  . acetaminophen (TYLENOL) 325 MG tablet Take 650 mg by mouth every 6 (six) hours as needed.    Marland Kitchen albuterol (PROVENTIL) (2.5 MG/3ML) 0.083% nebulizer solution Take 2.5 mg by nebulization every 6 (six) hours as needed for wheezing or shortness of breath.    Marland Kitchen amLODipine (NORVASC) 5 MG tablet Take 5 mg by mouth daily.   4  . aspirin EC 81 MG tablet Take 162 mg by mouth at bedtime.     . clonazePAM (KLONOPIN) 0.5 MG tablet One three times a day with meals and 2 at bedtime (Patient taking differently: Take 0.5 mg by mouth as needed for anxiety. ) 75 tablet 0  . estradiol (ESTRACE) 0.5 MG tablet Take 0.5 mg by mouth daily.    . famotidine (PEPCID) 20 MG tablet Take by mouth.    Marland Kitchen FLUoxetine (PROZAC) 20 MG tablet Take by mouth.    Marland Kitchen HYDROcodone-homatropine (HYCODAN) 5-1.5 MG/5ML syrup Take 5 mLs by mouth every 6 (six) hours as needed for cough. 473 mL 0  . ibuprofen (ADVIL,MOTRIN) 100 MG tablet Take 200 mg by mouth every 6 (six) hours  as needed for fever.    . levothyroxine (SYNTHROID, LEVOTHROID) 125 MCG tablet Take 137 mcg by mouth daily before breakfast.     . lidocaine-prilocaine (EMLA) cream Apply 1 application topically as needed. 30 g 2  . medroxyPROGESTERone (PROVERA) 2.5 MG tablet Take 2.5 mg by mouth daily.    Marland Kitchen morphine (MSIR) 15 MG tablet Take 1 tablet (15 mg total) by mouth every 6 (six) hours as needed for severe pain (pain). 45 tablet 0  . prochlorperazine (COMPAZINE) 10 MG tablet Take 1 tablet (10 mg total) by mouth every 6 (six) hours as needed for nausea or vomiting. 30 tablet 2  . sodium chloride HYPERTONIC 3 % nebulizer solution Inhale into the lungs.     No current facility-administered medications for this visit.    Past Medical History  Diagnosis Date  . Thyroid disease     hypothyroidism  . Hx of thyroid cancer   . Status post bilateral breast implants   . Consolidation lung ([place]) 03/21/14    RLL PER CXR/CT  . Hypertension     just started,not on medication  . Shortness of breath     exertion  . Hypothyroidism   . Mitral valve prolapse   . Anxiety   . Arthritis   . Fibromyalgia   . Complication of anesthesia     headaches,very agitated while waking  up  . Cancer ([place])     thyroid  .  Lung cancer ([place])     2015  . Encounter for antineoplastic immunotherapy 07/05/2015  . Hypothyroidism 07/26/2015  . Worsening headaches 11/13/2015    Past Surgical History  Procedure Laterality Date  . Thyroidectomy    . Cataract extraction Right   . Breast enhancement surgery    . Retinal detachment surgery    . Eye surgery    . Pars plana vitrectomy w/ repair of macular hole    . Back surgery      times 2  . Tonsillectomy    . Cervical cone biopsy    . Video bronchoscopy with endobronchial ultrasound N/A 04/07/2014    Procedure: VIDEO BRONCHOSCOPY WITH ENDOBRONCHIAL ULTRASOUND;  Surgeon: Melrose Nakayama, MD;  Location: [facility];  Service: Thoracic;  Laterality: N/A;    Social History    Social History  . Marital Status: Married    Spouse Name: N/A  . Number of Children: N/A  . Years of Education: N/A   Occupational History  . Not on file.   Social History Main Topics  . Smoking status: Former Smoker -- 0.50 packs/day for 49 years    Types: Cigarettes    Quit date: 02/25/2014  . Smokeless tobacco: Never Used  . Alcohol Use: 0.0 oz/week    0 Standard drinks or equivalent per week     Comment: SOCIAL  . Drug Use: Yes    Special: Marijuana     Comment: 1970's  . Sexual Activity: Not on file   Other Topics Concern  . Not on file   Social History Narrative     Filed Vitals:   11/28/15 1611  BP: 120/70  Pulse: 101  Height: '5\' 8"'$  (1.727 m)  Weight: 151 lb (68.493 kg)  SpO2: 98%    PHYSICAL EXAM General: NAD Neck: No JVD, no thyromegaly or thyroid nodule.  Lungs: Diminished at right base but otherwise clear, no rales. CV: Nondisplaced PMI. Mildly tachycardic, regular rhythm, normal S1/S2, no S3/S4, no murmur. No peripheral edema.   Abdomen: Soft, nontender, no distention.  Skin: Intact without lesions or rashes.  Neurologic: Alert and oriented x 3.  Psych: Normal affect. HEENT: Normal.    ECG: Most recent ECG reviewed.      ASSESSMENT AND PLAN: 1. Coronary artery calcifications on CT: Lexiscan Cardiolite stress test was low risk as mentioned above with normal LVEF. I do recommend continued use of aspirin 81 mg daily given her family history.  2. Essential HTN: Controlled on amlodiopine 5 mg. No changes.  3. Tachycardia: Due to have thyroid labs checked next week. Also uses albuterol. No need for AV nodal blocking agents at this time.  Dispo: f/u prn.   Kate Sable, M.D., F.A.C.C.

## 2015-11-28 NOTE — Patient Instructions (Signed)
Your physician recommends that you schedule a follow-up appointment in: as needed   Your physician recommends that you continue on your current medications as directed. Please refer to the Current Medication list given to you today.    If you need a refill on your cardiac medications before your next appointment, please call your pharmacy.     Thank you for choosing [facility] !

## 2015-11-29 ENCOUNTER — Other Ambulatory Visit: Payer: Medicare Other

## 2015-11-29 ENCOUNTER — Ambulatory Visit: Payer: Medicare Other

## 2015-12-04 ENCOUNTER — Telehealth: Payer: Self-pay | Admitting: Medical Oncology

## 2015-12-04 ENCOUNTER — Other Ambulatory Visit (HOSPITAL_BASED_OUTPATIENT_CLINIC_OR_DEPARTMENT_OTHER): Payer: Medicare Other

## 2015-12-04 ENCOUNTER — Ambulatory Visit (HOSPITAL_BASED_OUTPATIENT_CLINIC_OR_DEPARTMENT_OTHER): Payer: Medicare Other

## 2015-12-04 ENCOUNTER — Ambulatory Visit (HOSPITAL_BASED_OUTPATIENT_CLINIC_OR_DEPARTMENT_OTHER): Payer: Medicare Other | Admitting: Internal Medicine

## 2015-12-04 ENCOUNTER — Encounter: Payer: Self-pay | Admitting: Internal Medicine

## 2015-12-04 VITALS — BP 127/79 | HR 118 | Temp 99.3°F | Resp 18 | Ht 68.0 in | Wt 153.9 lb

## 2015-12-04 DIAGNOSIS — C3432 Malignant neoplasm of lower lobe, left bronchus or lung: Secondary | ICD-10-CM | POA: Diagnosis not present

## 2015-12-04 DIAGNOSIS — E039 Hypothyroidism, unspecified: Secondary | ICD-10-CM | POA: Diagnosis not present

## 2015-12-04 DIAGNOSIS — R51 Headache: Secondary | ICD-10-CM

## 2015-12-04 DIAGNOSIS — J700 Acute pulmonary manifestations due to radiation: Secondary | ICD-10-CM | POA: Diagnosis not present

## 2015-12-04 DIAGNOSIS — C3491 Malignant neoplasm of unspecified part of right bronchus or lung: Secondary | ICD-10-CM

## 2015-12-04 DIAGNOSIS — Z85850 Personal history of malignant neoplasm of thyroid: Secondary | ICD-10-CM

## 2015-12-04 DIAGNOSIS — R5383 Other fatigue: Secondary | ICD-10-CM

## 2015-12-04 DIAGNOSIS — C3431 Malignant neoplasm of lower lobe, right bronchus or lung: Secondary | ICD-10-CM | POA: Diagnosis not present

## 2015-12-04 DIAGNOSIS — R05 Cough: Secondary | ICD-10-CM

## 2015-12-04 DIAGNOSIS — Z5111 Encounter for antineoplastic chemotherapy: Secondary | ICD-10-CM

## 2015-12-04 DIAGNOSIS — R0602 Shortness of breath: Secondary | ICD-10-CM

## 2015-12-04 LAB — COMPREHENSIVE METABOLIC PANEL
ALT: 43 U/L (ref 0–55)
ANION GAP: 11 meq/L (ref 3–11)
AST: 28 U/L (ref 5–34)
Albumin: 3.2 g/dL — ABNORMAL LOW (ref 3.5–5.0)
Alkaline Phosphatase: 163 U/L — ABNORMAL HIGH (ref 40–150)
BUN: 13 mg/dL (ref 7.0–26.0)
CHLORIDE: 104 meq/L (ref 98–109)
CO2: 24 mEq/L (ref 22–29)
Calcium: 9.4 mg/dL (ref 8.4–10.4)
Creatinine: 0.8 mg/dL (ref 0.6–1.1)
EGFR: 80 mL/min/{1.73_m2} — AB (ref >90)
Glucose: 100 mg/dl (ref 70–140)
POTASSIUM: 4.2 meq/L (ref 3.5–5.1)
Sodium: 138 mEq/L (ref 136–145)
Total Bilirubin: 0.4 mg/dL (ref 0.20–1.20)
Total Protein: 7.2 g/dL (ref 6.4–8.3)

## 2015-12-04 LAB — CBC WITH DIFFERENTIAL/PLATELET
BASO%: 0.8 % (ref 0.0–2.0)
BASOS ABS: 0.1 10*3/uL (ref 0.0–0.1)
EOS ABS: 0.1 10*3/uL (ref 0.0–0.5)
EOS%: 0.5 % (ref 0.0–7.0)
HEMATOCRIT: 30.9 % — AB (ref 34.8–46.6)
HGB: 10.1 g/dL — ABNORMAL LOW (ref 11.6–15.9)
LYMPH#: 1 10*3/uL (ref 0.9–3.3)
LYMPH%: 8.7 % — ABNORMAL LOW (ref 14.0–49.7)
MCH: 29.8 pg (ref 25.1–34.0)
MCHC: 32.5 g/dL (ref 31.5–36.0)
MCV: 91.6 fL (ref 79.5–101.0)
MONO#: 1.2 10*3/uL — AB (ref 0.1–0.9)
MONO%: 11.2 % (ref 0.0–14.0)
NEUT#: 8.7 10*3/uL — ABNORMAL HIGH (ref 1.5–6.5)
NEUT%: 78.8 % — AB (ref 38.4–76.8)
PLATELETS: 263 10*3/uL (ref 145–400)
RBC: 3.38 10*6/uL — ABNORMAL LOW (ref 3.70–5.45)
RDW: 14.7 % — ABNORMAL HIGH (ref 11.2–14.5)
WBC: 11 10*3/uL — ABNORMAL HIGH (ref 3.9–10.3)

## 2015-12-04 LAB — TSH: TSH: 18.12 m[IU]/L — AB (ref 0.308–3.960)

## 2015-12-04 MED ORDER — HEPARIN SOD (PORK) LOCK FLUSH 100 UNIT/ML IV SOLN
500.0000 [IU] | Freq: Once | INTRAVENOUS | Status: AC | PRN
  Administered 2015-12-04: 500 [IU]
  Filled 2015-12-04: qty 5

## 2015-12-04 MED ORDER — SODIUM CHLORIDE 0.9 % IV SOLN
1000.0000 mg/m2 | Freq: Once | INTRAVENOUS | Status: AC
  Administered 2015-12-04: 1824 mg via INTRAVENOUS
  Filled 2015-12-04: qty 47.97

## 2015-12-04 MED ORDER — SODIUM CHLORIDE 0.9 % IV SOLN
Freq: Once | INTRAVENOUS | Status: AC
  Administered 2015-12-04: 12:00:00 via INTRAVENOUS

## 2015-12-04 MED ORDER — SODIUM CHLORIDE 0.9% FLUSH
10.0000 mL | INTRAVENOUS | Status: DC | PRN
  Administered 2015-12-04: 10 mL
  Filled 2015-12-04: qty 10

## 2015-12-04 MED ORDER — PALONOSETRON HCL INJECTION 0.25 MG/5ML
0.2500 mg | Freq: Once | INTRAVENOUS | Status: AC
  Administered 2015-12-04: 0.25 mg via INTRAVENOUS

## 2015-12-04 MED ORDER — PREDNISONE 10 MG PO TABS: ORAL_TABLET | ORAL | Status: AC

## 2015-12-04 MED ORDER — SODIUM CHLORIDE 0.9 % IV SOLN
10.0000 mg | Freq: Once | INTRAVENOUS | Status: AC
  Administered 2015-12-04: 10 mg via INTRAVENOUS
  Filled 2015-12-04: qty 1

## 2015-12-04 MED ORDER — CARBOPLATIN CHEMO INJECTION 600 MG/60ML
427.0000 mg | Freq: Once | INTRAVENOUS | Status: AC
  Administered 2015-12-04: 430 mg via INTRAVENOUS
  Filled 2015-12-04: qty 43

## 2015-12-04 MED ORDER — PALONOSETRON HCL INJECTION 0.25 MG/5ML
INTRAVENOUS | Status: AC
  Filled 2015-12-04: qty 5

## 2015-12-04 NOTE — Progress Notes (Signed)
[facility] Telephone:([phone]) [phone]   Fax:([phone]) [place], MD [address] [place] [address]  DIAGNOSIS: Progressive non-small cell lung cancer initially diagnosed as Stage IIIA (T3, N2, M0) non-small cell lung cancer, squamous cell carcinoma diagnosed in September of 2015 presenting with central right lower lobe lung mass with postobstructive pneumonitis and mediastinal lymph nodes, was also a suspicious small nodule in the right upper lobe.  PRIOR THERAPY:  2) Status post a course of concurrent chemoradiation with weekly carboplatin for AUC of 2 and paclitaxel 45 MG/M2, last dose was given 06/12/2014 with partial response. 2) Tecentriq (Atezolizumab) 1200 mg IV every 3 weeks. First dose 06/14/2015. Status post 5 cycles discontinued secondary to disease progression  CURRENT THERAPY: Systemic chemotherapy with carboplatin for AUC of 5 on day 1 and gemcitabine 1000 MG/M2 on days 1 and 8 every 3 weeks. Status post 2 cycles.  INTERVAL HISTORY: [name] 70 y.o. female returns to the clinic today for follow-up visit accompanied by her husband.  She is tolerating her current treatment with carboplatin and gemcitabine fairly well was no significant adverse effects. She denied having any significant nausea or vomiting. The patient has no chest pain but continues to have mild shortness of breath and dry cough with no hemoptysis. She was complaining of intermittent headache and CT scan of the head performed recently showed no evidence of metastatic disease to the brain. She denied having any significant fever or chills. She is here today to start cycle #3 of her treatment.  MEDICAL HISTORY: Past Medical History  Diagnosis Date  . Thyroid disease     hypothyroidism  . Hx of thyroid cancer   . Status post bilateral breast implants   . Consolidation lung ([place] [facility]) 03/21/14    RLL PER CXR/CT  . Hypertension     just started,not on  medication  . Shortness of breath     exertion  . Hypothyroidism   . Mitral valve prolapse   . Anxiety   . Arthritis   . Fibromyalgia   . Complication of anesthesia     headaches,very agitated while waking  up  . Cancer ([place])     thyroid  . Lung cancer ([place])     2015  . Encounter for antineoplastic immunotherapy 07/05/2015  . Hypothyroidism 07/26/2015  . Worsening headaches 11/13/2015    ALLERGIES:  is allergic to succinylcholine and niacin and related.  MEDICATIONS:  Current Outpatient Prescriptions  Medication Sig Dispense Refill  . acetaminophen (TYLENOL) 325 MG tablet Take 650 mg by mouth every 6 (six) hours as needed.    Marland Kitchen albuterol (PROVENTIL) (2.5 MG/3ML) 0.083% nebulizer solution Take 2.5 mg by nebulization every 6 (six) hours as needed for wheezing or shortness of breath.    Marland Kitchen amLODipine (NORVASC) 5 MG tablet Take 5 mg by mouth daily.   4  . aspirin EC 81 MG tablet Take 162 mg by mouth at bedtime.     . clonazePAM (KLONOPIN) 0.5 MG tablet One three times a day with meals and 2 at bedtime (Patient taking differently: Take 0.5 mg by mouth as needed for anxiety. ) 75 tablet 0  . estradiol (ESTRACE) 0.5 MG tablet Take 0.5 mg by mouth daily.    . famotidine (PEPCID) 20 MG tablet Take by mouth.    Marland Kitchen FLUoxetine (PROZAC) 20 MG tablet Take by mouth.    Marland Kitchen ibuprofen (ADVIL,MOTRIN) 100 MG tablet Take 200 mg by  mouth every 6 (six) hours as needed for fever.    . levothyroxine (SYNTHROID, LEVOTHROID) 125 MCG tablet Take 137 mcg by mouth daily before breakfast.     . lidocaine-prilocaine (EMLA) cream Apply 1 application topically as needed. 30 g 2  . medroxyPROGESTERone (PROVERA) 2.5 MG tablet Take 2.5 mg by mouth daily.    Marland Kitchen morphine (MSIR) 15 MG tablet Take 1 tablet (15 mg total) by mouth every 6 (six) hours as needed for severe pain (pain). 45 tablet 0  . sodium chloride HYPERTONIC 3 % nebulizer solution Inhale into the lungs.    Marland Kitchen HYDROcodone-homatropine (HYCODAN) 5-1.5 MG/5ML syrup  Take 5 mLs by mouth every 6 (six) hours as needed for cough. (Patient not taking: Reported on 12/04/2015) 473 mL 0  . prochlorperazine (COMPAZINE) 10 MG tablet Take 1 tablet (10 mg total) by mouth every 6 (six) hours as needed for nausea or vomiting. (Patient not taking: Reported on 12/04/2015) 30 tablet 2   No current facility-administered medications for this visit.    SURGICAL HISTORY:  Past Surgical History  Procedure Laterality Date  . Thyroidectomy    . Cataract extraction Right   . Breast enhancement surgery    . Retinal detachment surgery    . Eye surgery    . Pars plana vitrectomy w/ repair of macular hole    . Back surgery      times 2  . Tonsillectomy    . Cervical cone biopsy    . Video bronchoscopy with endobronchial ultrasound N/A 04/07/2014    Procedure: VIDEO BRONCHOSCOPY WITH ENDOBRONCHIAL ULTRASOUND;  Surgeon: Melrose Nakayama, MD;  Location: [facility];  Service: Thoracic;  Laterality: N/A;    REVIEW OF SYSTEMS:  Constitutional: positive for fatigue Eyes: negative Ears, nose, mouth, throat, and face: negative Respiratory: positive for cough and dyspnea on exertion Cardiovascular: negative Gastrointestinal: negative Genitourinary:negative Integument/breast: negative Hematologic/lymphatic: negative Musculoskeletal:negative Neurological: negative Behavioral/Psych: negative Endocrine: negative Allergic/Immunologic: negative   PHYSICAL EXAMINATION: General appearance: alert, cooperative, fatigued and no distress Head: Normocephalic, without obvious abnormality, atraumatic Neck: no adenopathy, no JVD, supple, symmetrical, trachea midline and thyroid not enlarged, symmetric, no tenderness/mass/nodules Lymph nodes: Cervical, supraclavicular, and axillary nodes normal. Resp: wheezes bilaterally Back: symmetric, no curvature. ROM normal. No CVA tenderness. Cardio: regular rate and rhythm, S1, S2 normal, no murmur, click, rub or gallop GI: soft, non-tender; bowel sounds  normal; no masses,  no organomegaly Extremities: extremities normal, atraumatic, no cyanosis or edema Neurologic: Alert and oriented X 3, normal strength and tone. Normal symmetric reflexes. Normal coordination and gait  ECOG PERFORMANCE STATUS: 1 - Symptomatic but completely ambulatory  Blood pressure 127/79, pulse 118, temperature 99.3 F (37.4 C), temperature source Oral, resp. rate 18, height '5\' 8"'$  (1.727 m), weight 153 lb 14.4 oz (69.809 kg), SpO2 98 %.  LABORATORY DATA: Lab Results  Component Value Date   WBC 11.0* 12/04/2015   HGB 10.1* 12/04/2015   HCT 30.9* 12/04/2015   MCV 91.6 12/04/2015   PLT 263 12/04/2015      Chemistry      Component Value Date/Time   NA 138 11/20/2015 1024   NA 138 07/28/2014 1611   K 3.8 11/20/2015 1024   K 3.9 07/28/2014 1611   CL 105 07/28/2014 1611   CO2 24 11/20/2015 1024   CO2 26 07/28/2014 1611   BUN 12.7 11/20/2015 1024   BUN 12 07/28/2014 1611   CREATININE 0.8 11/20/2015 1024   CREATININE 0.53 07/28/2014 1611  Component Value Date/Time   CALCIUM 9.6 11/20/2015 1024   CALCIUM 9.3 07/28/2014 1611   ALKPHOS 170* 11/20/2015 1024   ALKPHOS 197* 04/05/2014 1336   AST 39* 11/20/2015 1024   AST 18 04/05/2014 1336   ALT 49 11/20/2015 1024   ALT 33 04/05/2014 1336   BILITOT 0.30 11/20/2015 1024   BILITOT 0.3 04/05/2014 1336       RADIOGRAPHIC STUDIES: Ct Head W Wo Contrast  11/19/2015  CLINICAL DATA:  Worsening intermittent headaches over the last month. Personal history of non-small-cell lung cancer. EXAM: CT HEAD WITHOUT AND WITH CONTRAST TECHNIQUE: Contiguous axial images were obtained from the base of the skull through the vertex without and with intravenous contrast CONTRAST:  66m ISOVUE-300 IOPAMIDOL (ISOVUE-300) INJECTION 61% COMPARISON:  MRI 04/19/2014 FINDINGS: No evidence of acute infarction. No evidence of primary or metastatic mass lesion. There chronic small-vessel ischemic changes affecting the cerebral hemispheric  deep and subcortical white matter. No hemorrhage, hydrocephalus or extra-axial collection. The calvarium is normal. No fluid in the sinuses, middle ears or mastoids. No abnormal contrast enhancement. There is atherosclerotic calcification of the major vessels at the base of the brain. IMPRESSION: Chronic small-vessel ischemic changes of the cerebral hemispheric white matter. No evidence of metastatic disease or other acute intracranial finding. Electronically Signed   By: MNelson ChimesM.D.   On: 11/19/2015 15:36   Nm Myocar Multi W/spect W/wall Motion / Ef  11/26/2015   No diagnostic ST segment changes to indicate ischemia.  Small, mild intensity, apical anterolateral defect that exhibits partial reversibility in the lateral segment. This could be due to variable soft tissue attenuation, cannot exclude a small region of ischemia.  This is a low risk study.  Nuclear stress EF: 56%.     ASSESSMENT AND PLAN: This is a very pleasant 70years old white female with history of stage IIIA non-small cell lung cancer status post a course of concurrent chemoradiation with weekly carboplatin and paclitaxel with significant improvement. She has been observation since November 2015. She was found to have disease progression on restaging his scan. The patient was started on treatment with Tecentriq (Huey Bienenstock status post 5 cycles and tolerated it fairly well. Unfortunately the recent CT scan of the chest showed further increase in the size of the right lower lobe lung mass. I discussed the scan results with the patient and her husband. This represent a second progression and most likely true progression on the immunotherapy. I recommended for the patient to discontinue her treatment with Tecentriq (Atezolizumab) at this point. She is currently undergoing systemic chemotherapy with carboplatin for AUC of 5 on day 1 and gemcitabine 1000 MG/M2 on days 1 and 8 every 3 weeks. She is status post 2 cycles. She is  tolerating her treatment well except for fatigue.  I recommended for the patient to proceed with cycle #3 today as scheduled. For the headache, the recent CT scan of the head showed no evidence for disease metastasis to the brain For the hypothyroidism,  her TSH is better today. the patient will continue her treatment with levothyroxine as prescribed by her primary care physician. For the radiation-induced pneumonitis, I will start the patient on daily dose of prednisone 10 mg by mouth daily. The patient would come back for follow-up visit in 3 weeks for evaluation before starting cycle #4 after repeating CT scan of the chest, abdomen and pelvis for restaging of her disease. She was advised to call immediately if she has any concerning symptoms  in the interval. The patient voices understanding of current disease status and treatment options and is in agreement with the current care plan.  All questions were answered. The patient knows to call the clinic with any problems, questions or concerns. We can certainly see the patient much sooner if necessary.  Disclaimer: This note was dictated with voice recognition software. Similar sounding words can inadvertently be transcribed and may not be corrected upon review.

## 2015-12-04 NOTE — Telephone Encounter (Signed)
Pt notified and labs faxed to Dr Willey Blade.

## 2015-12-04 NOTE — Progress Notes (Signed)
Quick Note:  Call patient with the result and she needs to call her PCP for adjusting her Thyroid medication. No need to keep ordering TSH on her. She is done with Immunotherapy. ______

## 2015-12-04 NOTE — Patient Instructions (Signed)
[facility] Discharge Instructions for Patients Receiving Chemotherapy  Today you received the following chemotherapy agents :  Gemzar/Carboplatin  To help prevent nausea and vomiting after your treatment, we encourage you to take your nausea medicationIf you develop nausea and vomiting that is not controlled by your nausea medication, call the clinic.   BELOW ARE SYMPTOMS THAT SHOULD BE REPORTED IMMEDIATELY:  *FEVER GREATER THAN 100.5 F  *CHILLS WITH OR WITHOUT FEVER  NAUSEA AND VOMITING THAT IS NOT CONTROLLED WITH YOUR NAUSEA MEDICATION  *UNUSUAL SHORTNESS OF BREATH  *UNUSUAL BRUISING OR BLEEDING  TENDERNESS IN MOUTH AND THROAT WITH OR WITHOUT PRESENCE OF ULCERS  *URINARY PROBLEMS  *BOWEL PROBLEMS  UNUSUAL RASH Items with * indicate a potential emergency and should be followed up as soon as possible.  Feel free to call the clinic you have any questions or concerns. The clinic phone number is [phone].  Please show the CHEMO ALERT CARD at check-in to the Emergency Department and triage nurse.   

## 2015-12-04 NOTE — Telephone Encounter (Signed)
-----   Message from Curt Bears, MD sent at 12/04/2015  1:22 PM EDT ----- Call patient with the result and she needs to call her PCP for adjusting her Thyroid medication. No need to keep ordering TSH on her. She is done with Immunotherapy.

## 2015-12-06 ENCOUNTER — Ambulatory Visit: Payer: Medicare Other

## 2015-12-06 ENCOUNTER — Other Ambulatory Visit: Payer: Medicare Other

## 2015-12-06 NOTE — Telephone Encounter (Signed)
Pt called stating Dr Willey Blade has not received labs. Labs re-faxed via epic.

## 2015-12-11 ENCOUNTER — Other Ambulatory Visit (HOSPITAL_BASED_OUTPATIENT_CLINIC_OR_DEPARTMENT_OTHER): Payer: Medicare Other

## 2015-12-11 ENCOUNTER — Ambulatory Visit (HOSPITAL_BASED_OUTPATIENT_CLINIC_OR_DEPARTMENT_OTHER): Payer: Medicare Other

## 2015-12-11 VITALS — BP 117/74 | HR 123 | Temp 99.2°F | Resp 20

## 2015-12-11 DIAGNOSIS — C3491 Malignant neoplasm of unspecified part of right bronchus or lung: Secondary | ICD-10-CM

## 2015-12-11 DIAGNOSIS — C3432 Malignant neoplasm of lower lobe, left bronchus or lung: Secondary | ICD-10-CM

## 2015-12-11 DIAGNOSIS — C3431 Malignant neoplasm of lower lobe, right bronchus or lung: Secondary | ICD-10-CM

## 2015-12-11 DIAGNOSIS — Z5111 Encounter for antineoplastic chemotherapy: Secondary | ICD-10-CM | POA: Diagnosis not present

## 2015-12-11 LAB — CBC WITH DIFFERENTIAL/PLATELET
BASO%: 0.2 % (ref 0.0–2.0)
BASOS ABS: 0 10*3/uL (ref 0.0–0.1)
EOS ABS: 0.1 10*3/uL (ref 0.0–0.5)
EOS%: 0.5 % (ref 0.0–7.0)
HCT: 30.5 % — ABNORMAL LOW (ref 34.8–46.6)
HGB: 10.1 g/dL — ABNORMAL LOW (ref 11.6–15.9)
LYMPH%: 14.4 % (ref 14.0–49.7)
MCH: 29.9 pg (ref 25.1–34.0)
MCHC: 33.1 g/dL (ref 31.5–36.0)
MCV: 90.2 fL (ref 79.5–101.0)
MONO#: 1.1 10*3/uL — AB (ref 0.1–0.9)
MONO%: 10.9 % (ref 0.0–14.0)
NEUT#: 7.2 10*3/uL — ABNORMAL HIGH (ref 1.5–6.5)
NEUT%: 74 % (ref 38.4–76.8)
PLATELETS: 296 10*3/uL (ref 145–400)
RBC: 3.38 10*6/uL — AB (ref 3.70–5.45)
RDW: 14.7 % — ABNORMAL HIGH (ref 11.2–14.5)
WBC: 9.7 10*3/uL (ref 3.9–10.3)
lymph#: 1.4 10*3/uL (ref 0.9–3.3)

## 2015-12-11 LAB — COMPREHENSIVE METABOLIC PANEL
ALT: 44 U/L (ref 0–55)
ANION GAP: 12 meq/L — AB (ref 3–11)
AST: 31 U/L (ref 5–34)
Albumin: 3.6 g/dL (ref 3.5–5.0)
Alkaline Phosphatase: 154 U/L — ABNORMAL HIGH (ref 40–150)
BILIRUBIN TOTAL: 0.35 mg/dL (ref 0.20–1.20)
BUN: 17.1 mg/dL (ref 7.0–26.0)
CALCIUM: 9.5 mg/dL (ref 8.4–10.4)
CHLORIDE: 99 meq/L (ref 98–109)
CO2: 26 mEq/L (ref 22–29)
CREATININE: 1 mg/dL (ref 0.6–1.1)
EGFR: 59 mL/min/{1.73_m2} — ABNORMAL LOW (ref >90)
Glucose: 95 mg/dl (ref 70–140)
Potassium: 3.5 mEq/L (ref 3.5–5.1)
Sodium: 137 mEq/L (ref 136–145)
TOTAL PROTEIN: 7.6 g/dL (ref 6.4–8.3)

## 2015-12-11 MED ORDER — SODIUM CHLORIDE 0.9 % IV SOLN
Freq: Once | INTRAVENOUS | Status: AC
  Administered 2015-12-11: 11:00:00 via INTRAVENOUS

## 2015-12-11 MED ORDER — PROCHLORPERAZINE MALEATE 10 MG PO TABS
ORAL_TABLET | ORAL | Status: AC
  Filled 2015-12-11: qty 1

## 2015-12-11 MED ORDER — SODIUM CHLORIDE 0.9% FLUSH
10.0000 mL | INTRAVENOUS | Status: DC | PRN
  Administered 2015-12-11: 10 mL
  Filled 2015-12-11: qty 10

## 2015-12-11 MED ORDER — PROCHLORPERAZINE MALEATE 10 MG PO TABS
10.0000 mg | ORAL_TABLET | Freq: Once | ORAL | Status: AC
  Administered 2015-12-11: 10 mg via ORAL

## 2015-12-11 MED ORDER — SODIUM CHLORIDE 0.9 % IV SOLN
1000.0000 mg/m2 | Freq: Once | INTRAVENOUS | Status: AC
  Administered 2015-12-11: 1824 mg via INTRAVENOUS
  Filled 2015-12-11: qty 47.97

## 2015-12-11 MED ORDER — HEPARIN SOD (PORK) LOCK FLUSH 100 UNIT/ML IV SOLN
500.0000 [IU] | Freq: Once | INTRAVENOUS | Status: AC | PRN
  Administered 2015-12-11: 500 [IU]
  Filled 2015-12-11: qty 5

## 2015-12-11 NOTE — Patient Instructions (Signed)
[facility] Discharge Instructions for Patients Receiving Chemotherapy  Today you received the following chemotherapy agents Gemzar  To help prevent nausea and vomiting after your treatment, we encourage you to take your nausea medication as prescribed   If you develop nausea and vomiting that is not controlled by your nausea medication, call the clinic.   BELOW ARE SYMPTOMS THAT SHOULD BE REPORTED IMMEDIATELY:  *FEVER GREATER THAN 100.5 F  *CHILLS WITH OR WITHOUT FEVER  NAUSEA AND VOMITING THAT IS NOT CONTROLLED WITH YOUR NAUSEA MEDICATION  *UNUSUAL SHORTNESS OF BREATH  *UNUSUAL BRUISING OR BLEEDING  TENDERNESS IN MOUTH AND THROAT WITH OR WITHOUT PRESENCE OF ULCERS  *URINARY PROBLEMS  *BOWEL PROBLEMS  UNUSUAL RASH Items with * indicate a potential emergency and should be followed up as soon as possible.  Feel free to call the clinic you have any questions or concerns. The clinic phone number is [phone].  Please show the CHEMO ALERT CARD at check-in to the Emergency Department and triage nurse.   

## 2015-12-19 ENCOUNTER — Encounter (HOSPITAL_COMMUNITY): Payer: Self-pay | Admitting: *Deleted

## 2015-12-19 ENCOUNTER — Emergency Department (HOSPITAL_COMMUNITY): Payer: Medicare Other

## 2015-12-19 ENCOUNTER — Inpatient Hospital Stay (HOSPITAL_COMMUNITY)
Admission: EM | Admit: 2015-12-19 | Discharge: 2015-12-21 | DRG: 205 | Disposition: A | Discharge status: Home / Self Care | Payer: Medicare Other | Attending: Internal Medicine | Admitting: Internal Medicine

## 2015-12-19 DIAGNOSIS — Z9221 Personal history of antineoplastic chemotherapy: Secondary | ICD-10-CM

## 2015-12-19 DIAGNOSIS — I959 Hypotension, unspecified: Secondary | ICD-10-CM | POA: Diagnosis present

## 2015-12-19 DIAGNOSIS — Z9882 Breast implant status: Secondary | ICD-10-CM

## 2015-12-19 DIAGNOSIS — Z8249 Family history of ischemic heart disease and other diseases of the circulatory system: Secondary | ICD-10-CM

## 2015-12-19 DIAGNOSIS — A419 Sepsis, unspecified organism: Secondary | ICD-10-CM

## 2015-12-19 DIAGNOSIS — Y95 Nosocomial condition: Secondary | ICD-10-CM | POA: Diagnosis present

## 2015-12-19 DIAGNOSIS — Z7982 Long term (current) use of aspirin: Secondary | ICD-10-CM

## 2015-12-19 DIAGNOSIS — D6481 Anemia due to antineoplastic chemotherapy: Secondary | ICD-10-CM | POA: Diagnosis present

## 2015-12-19 DIAGNOSIS — E876 Hypokalemia: Secondary | ICD-10-CM | POA: Diagnosis present

## 2015-12-19 DIAGNOSIS — Z85850 Personal history of malignant neoplasm of thyroid: Secondary | ICD-10-CM

## 2015-12-19 DIAGNOSIS — R000 Tachycardia, unspecified: Secondary | ICD-10-CM

## 2015-12-19 DIAGNOSIS — Z87891 Personal history of nicotine dependence: Secondary | ICD-10-CM

## 2015-12-19 DIAGNOSIS — F1290 Cannabis use, unspecified, uncomplicated: Secondary | ICD-10-CM | POA: Diagnosis present

## 2015-12-19 DIAGNOSIS — J189 Pneumonia, unspecified organism: Secondary | ICD-10-CM | POA: Diagnosis present

## 2015-12-19 DIAGNOSIS — J9589 Other postprocedural complications and disorders of respiratory system, not elsewhere classified: Principal | ICD-10-CM | POA: Diagnosis present

## 2015-12-19 DIAGNOSIS — Z79899 Other long term (current) drug therapy: Secondary | ICD-10-CM

## 2015-12-19 DIAGNOSIS — E871 Hypo-osmolality and hyponatremia: Secondary | ICD-10-CM | POA: Diagnosis present

## 2015-12-19 DIAGNOSIS — Z888 Allergy status to other drugs, medicaments and biological substances status: Secondary | ICD-10-CM

## 2015-12-19 DIAGNOSIS — Z803 Family history of malignant neoplasm of breast: Secondary | ICD-10-CM

## 2015-12-19 DIAGNOSIS — I10 Essential (primary) hypertension: Secondary | ICD-10-CM | POA: Diagnosis present

## 2015-12-19 DIAGNOSIS — E222 Syndrome of inappropriate secretion of antidiuretic hormone: Secondary | ICD-10-CM | POA: Diagnosis present

## 2015-12-19 DIAGNOSIS — T451X5A Adverse effect of antineoplastic and immunosuppressive drugs, initial encounter: Secondary | ICD-10-CM | POA: Diagnosis present

## 2015-12-19 DIAGNOSIS — M797 Fibromyalgia: Secondary | ICD-10-CM | POA: Diagnosis present

## 2015-12-19 DIAGNOSIS — F419 Anxiety disorder, unspecified: Secondary | ICD-10-CM | POA: Diagnosis present

## 2015-12-19 DIAGNOSIS — I341 Nonrheumatic mitral (valve) prolapse: Secondary | ICD-10-CM | POA: Diagnosis present

## 2015-12-19 DIAGNOSIS — E039 Hypothyroidism, unspecified: Secondary | ICD-10-CM | POA: Diagnosis present

## 2015-12-19 DIAGNOSIS — R6521 Severe sepsis with septic shock: Secondary | ICD-10-CM | POA: Diagnosis present

## 2015-12-19 DIAGNOSIS — Z923 Personal history of irradiation: Secondary | ICD-10-CM

## 2015-12-19 DIAGNOSIS — C3431 Malignant neoplasm of lower lobe, right bronchus or lung: Secondary | ICD-10-CM | POA: Diagnosis present

## 2015-12-19 DIAGNOSIS — D6489 Other specified anemias: Secondary | ICD-10-CM

## 2015-12-19 DIAGNOSIS — D696 Thrombocytopenia, unspecified: Secondary | ICD-10-CM | POA: Diagnosis present

## 2015-12-19 DIAGNOSIS — R509 Fever, unspecified: Secondary | ICD-10-CM | POA: Diagnosis not present

## 2015-12-19 DIAGNOSIS — C3491 Malignant neoplasm of unspecified part of right bronchus or lung: Secondary | ICD-10-CM | POA: Diagnosis present

## 2015-12-19 LAB — POC OCCULT BLOOD, ED: FECAL OCCULT BLD: NEGATIVE

## 2015-12-19 LAB — COMPREHENSIVE METABOLIC PANEL
ALBUMIN: 3.6 g/dL (ref 3.5–5.0)
ALK PHOS: 162 U/L — AB (ref 38–126)
ALT: 32 U/L (ref 14–54)
ANION GAP: 12 (ref 5–15)
AST: 24 U/L (ref 15–41)
BILIRUBIN TOTAL: 0.7 mg/dL (ref 0.3–1.2)
BUN: 8 mg/dL (ref 6–20)
CALCIUM: 8.1 mg/dL — AB (ref 8.9–10.3)
CO2: 22 mmol/L (ref 22–32)
Chloride: 96 mmol/L — ABNORMAL LOW (ref 101–111)
Creatinine, Ser: 0.66 mg/dL (ref 0.44–1.00)
GFR calc Af Amer: >60 mL/min (ref >60)
GFR calc non Af Amer: >60 mL/min (ref >60)
GLUCOSE: 119 mg/dL — AB (ref 65–99)
Potassium: 2.9 mmol/L — ABNORMAL LOW (ref 3.5–5.1)
SODIUM: 130 mmol/L — AB (ref 135–145)
Total Protein: 6.7 g/dL (ref 6.5–8.1)

## 2015-12-19 LAB — CBC WITH DIFFERENTIAL/PLATELET
BASOS PCT: 0 %
Basophils Absolute: 0 10*3/uL (ref 0.0–0.1)
EOS PCT: 0 %
Eosinophils Absolute: 0 10*3/uL (ref 0.0–0.7)
HCT: 20.5 % — ABNORMAL LOW (ref 36.0–46.0)
Hemoglobin: 7 g/dL — ABNORMAL LOW (ref 12.0–15.0)
LYMPHS PCT: 20 %
Lymphs Abs: 1.7 10*3/uL (ref 0.7–4.0)
MCH: 30.6 pg (ref 26.0–34.0)
MCHC: 34.1 g/dL (ref 30.0–36.0)
MCV: 89.5 fL (ref 78.0–100.0)
Monocytes Absolute: 1.9 10*3/uL — ABNORMAL HIGH (ref 0.1–1.0)
Monocytes Relative: 23 %
NEUTROS ABS: 4.8 10*3/uL (ref 1.7–7.7)
Neutrophils Relative %: 57 %
PLATELETS: 55 10*3/uL — AB (ref 150–400)
RBC: 2.29 MIL/uL — ABNORMAL LOW (ref 3.87–5.11)
RDW: 15.6 % — ABNORMAL HIGH (ref 11.5–15.5)
WBC: 8.4 10*3/uL (ref 4.0–10.5)

## 2015-12-19 LAB — URINALYSIS, ROUTINE W REFLEX MICROSCOPIC
Bilirubin Urine: NEGATIVE
GLUCOSE, UA: NEGATIVE mg/dL
HGB URINE DIPSTICK: NEGATIVE
KETONES UR: NEGATIVE mg/dL
LEUKOCYTES UA: NEGATIVE
Nitrite: NEGATIVE
PH: 7 (ref 5.0–8.0)
Protein, ur: NEGATIVE mg/dL
Specific Gravity, Urine: 1.003 — ABNORMAL LOW (ref 1.005–1.030)

## 2015-12-19 LAB — I-STAT CG4 LACTIC ACID, ED
LACTIC ACID, VENOUS: 1.5 mmol/L (ref 0.5–2.0)
Lactic Acid, Venous: 1.39 mmol/L (ref 0.5–2.0)

## 2015-12-19 MED ORDER — SODIUM CHLORIDE 0.9 % IV BOLUS (SEPSIS)
1000.0000 mL | Freq: Once | INTRAVENOUS | Status: AC
  Administered 2015-12-19: 1000 mL via INTRAVENOUS

## 2015-12-19 MED ORDER — LIDOCAINE-PRILOCAINE 2.5-2.5 % EX CREA
TOPICAL_CREAM | Freq: Once | CUTANEOUS | Status: AC
  Administered 2015-12-19: 1 via TOPICAL
  Filled 2015-12-19: qty 5

## 2015-12-19 MED ORDER — ACETAMINOPHEN 325 MG PO TABS
650.0000 mg | ORAL_TABLET | Freq: Once | ORAL | Status: AC | PRN
  Administered 2015-12-19: 650 mg via ORAL
  Filled 2015-12-19: qty 2

## 2015-12-19 MED ORDER — PIPERACILLIN-TAZOBACTAM 3.375 G IVPB
3.3750 g | Freq: Three times a day (TID) | INTRAVENOUS | Status: DC
  Administered 2015-12-20 – 2015-12-21 (×4): 3.375 g via INTRAVENOUS
  Filled 2015-12-19 (×4): qty 50

## 2015-12-19 MED ORDER — PIPERACILLIN-TAZOBACTAM 3.375 G IVPB 30 MIN
3.3750 g | Freq: Once | INTRAVENOUS | Status: AC
  Administered 2015-12-19: 3.375 g via INTRAVENOUS
  Filled 2015-12-19: qty 50

## 2015-12-19 MED ORDER — VANCOMYCIN HCL IN DEXTROSE 750-5 MG/150ML-% IV SOLN
750.0000 mg | Freq: Two times a day (BID) | INTRAVENOUS | Status: DC
  Administered 2015-12-20 – 2015-12-21 (×3): 750 mg via INTRAVENOUS
  Filled 2015-12-19 (×4): qty 150

## 2015-12-19 MED ORDER — VANCOMYCIN HCL IN DEXTROSE 1-5 GM/200ML-% IV SOLN
1000.0000 mg | Freq: Once | INTRAVENOUS | Status: AC
  Administered 2015-12-19: 1000 mg via INTRAVENOUS
  Filled 2015-12-19: qty 200

## 2015-12-19 MED ORDER — SODIUM CHLORIDE 0.9 % IV BOLUS (SEPSIS)
250.0000 mL | Freq: Once | INTRAVENOUS | Status: AC
  Administered 2015-12-19: 250 mL via INTRAVENOUS

## 2015-12-19 MED ORDER — SODIUM CHLORIDE 0.9 % IV SOLN
10.0000 mL/h | Freq: Once | INTRAVENOUS | Status: AC
  Administered 2015-12-20: 10 mL/h via INTRAVENOUS

## 2015-12-19 MED ORDER — POTASSIUM CHLORIDE CRYS ER 20 MEQ PO TBCR
40.0000 meq | EXTENDED_RELEASE_TABLET | Freq: Once | ORAL | Status: AC
  Administered 2015-12-19: 40 meq via ORAL
  Filled 2015-12-19: qty 2

## 2015-12-19 NOTE — ED Provider Notes (Signed)
CSN: [id number]     Arrival date & time 12/19/15  2012 History   First MD Initiated Contact with Patient 12/19/15 2055     Chief Complaint  Patient presents with  . Cough  . Fever     (Consider location/radiation/quality/duration/timing/severity/associated sxs/prior Treatment) HPI Comments: This a 70 year old female with a history of lung cancer for the past 2 years.  She's had multiple rounds of chemotherapy and radiation therapy, which Left her with a chronic cough and pneumonitis.  She is currently undergoing chemotherapy.  She is in week 2 of a planned 7 week course.  She also states that she was at [facility] last week for bronchoscopy.  She noticed that she did not feel well proximally 2 days after the procedure.  She did not take her temperature until tonight when I was noted to be over 102.  He did call the oncology Hotline suggested she come to the emergency department for further evaluation. She does have a port.   Patient is a 70 y.o. female presenting with cough and fever. The history is provided by the patient.  Cough Cough characteristics:  Non-productive Severity:  Moderate Onset quality:  Gradual Timing:  Intermittent Progression:  Worsening Chronicity:  Chronic Smoker: no   Relieved by:  Nothing Worsened by:  Nothing tried Ineffective treatments:  None tried Associated symptoms: fever and shortness of breath   Associated symptoms: no rhinorrhea   Fever:    Duration:  3 days   Timing:  Unable to specify   Temp source:  Unable to specify   Progression:  Unable to specify Shortness of breath:    Severity:  Mild   Onset quality:  Gradual   Timing:  Constant   Progression:  Worsening Risk factors comment:  Lung cancer Fever Associated symptoms: cough   Associated symptoms: no nausea, no rhinorrhea and no vomiting     Past Medical History  Diagnosis Date  . Thyroid disease     hypothyroidism  . Hx of thyroid cancer   . Status post  bilateral breast implants   . Consolidation lung ([place]) 03/21/14    RLL PER CXR/CT  . Hypertension     just started,not on medication  . Shortness of breath     exertion  . Hypothyroidism   . Mitral valve prolapse   . Anxiety   . Arthritis   . Fibromyalgia   . Complication of anesthesia     headaches,very agitated while waking  up  . Cancer ([place])     thyroid  . Lung cancer ([place])     2015  . Encounter for antineoplastic immunotherapy 07/05/2015  . Hypothyroidism 07/26/2015  . Worsening headaches 11/13/2015   Past Surgical History  Procedure Laterality Date  . Thyroidectomy    . Cataract extraction Right   . Breast enhancement surgery    . Retinal detachment surgery    . Eye surgery    . Pars plana vitrectomy w/ repair of macular hole    . Back surgery      times 2  . Tonsillectomy    . Cervical cone biopsy    . Video bronchoscopy with endobronchial ultrasound N/A 04/07/2014    Procedure: VIDEO BRONCHOSCOPY WITH ENDOBRONCHIAL ULTRASOUND;  Surgeon: Melrose Nakayama, MD;  Location: [facility] OR;  Service: Thoracic;  Laterality: N/A;   Family History  Problem Relation Age of Onset  . Early death Mother   . Heart disease Mother   . Early death Father   .  Heart disease Father     MI  . Cancer Sister     BREAST  . Heart disease Sister     MI 2013   Social History  Substance Use Topics  . Smoking status: Former Smoker -- 0.50 packs/day for 49 years    Types: Cigarettes    Quit date: 02/25/2014  . Smokeless tobacco: Never Used  . Alcohol Use: 0.0 oz/week    0 Standard drinks or equivalent per week     Comment: SOCIAL   OB History    No data available     Review of Systems  Constitutional: Positive for fever.  HENT: Negative for rhinorrhea.   Respiratory: Positive for cough and shortness of breath.   Gastrointestinal: Negative for nausea and vomiting.  Genitourinary: Positive for frequency.  Skin: Positive for pallor.  All other systems reviewed and are  negative.     Allergies  Succinylcholine and Niacin and related  Home Medications   Prior to Admission medications   Medication Sig Start Date End Date Taking? Authorizing Provider  acetaminophen (TYLENOL) 325 MG tablet Take 650 mg by mouth every 6 (six) hours as needed for mild pain, moderate pain or headache.    Yes Historical Provider, MD  albuterol (PROVENTIL) (2.5 MG/3ML) 0.083% nebulizer solution Take 2.5 mg by nebulization every 6 (six) hours as needed for wheezing or shortness of breath.   Yes Historical Provider, MD  amLODipine (NORVASC) 5 MG tablet Take 5 mg by mouth daily.  04/21/14  Yes Historical Provider, MD  aspirin EC 81 MG tablet Take 81 mg by mouth at bedtime.    Yes Historical Provider, MD  clonazePAM (KLONOPIN) 0.5 MG tablet One three times a day with meals and 2 at bedtime Patient taking differently: Take 0.5 mg by mouth as needed for anxiety.  09/25/14  Yes Tanda Rockers, MD  estradiol (ESTRACE) 0.5 MG tablet Take 0.5 mg by mouth daily.   Yes Historical Provider, MD  famotidine (PEPCID) 20 MG tablet Take 20 mg by mouth daily as needed for heartburn or indigestion.  08/14/14  Yes Historical Provider, MD  FLUoxetine (PROZAC) 20 MG tablet Take 20 mg by mouth daily.    Yes Historical Provider, MD  HYDROcodone-homatropine (HYCODAN) 5-1.5 MG/5ML syrup Take 5 mLs by mouth every 6 (six) hours as needed for cough. 11/23/15  Yes Curt Bears, MD  ibuprofen (ADVIL,MOTRIN) 100 MG tablet Take 200 mg by mouth every 6 (six) hours as needed for fever.   Yes Historical Provider, MD  levothyroxine (SYNTHROID, LEVOTHROID) 175 MCG tablet Take 175 mcg by mouth daily.   Yes Historical Provider, MD  lidocaine-prilocaine (EMLA) cream Apply 1 application topically as needed. Patient taking differently: Apply 1 application topically as needed (pain).  10/19/15  Yes Susanne Borders, NP  medroxyPROGESTERone (PROVERA) 2.5 MG tablet Take 2.5 mg by mouth daily.   Yes Historical Provider, MD   morphine (MSIR) 15 MG tablet Take 1 tablet (15 mg total) by mouth every 6 (six) hours as needed for severe pain (pain). 10/18/15  Yes Susanne Borders, NP  predniSONE (DELTASONE) 10 MG tablet One tab po qd Patient taking differently: Take 10 mg by mouth daily with breakfast.  12/04/15  Yes Curt Bears, MD  prochlorperazine (COMPAZINE) 10 MG tablet Take 1 tablet (10 mg total) by mouth every 6 (six) hours as needed for nausea or vomiting. 10/18/15  Yes Susanne Borders, NP  sodium chloride HYPERTONIC 3 % nebulizer solution Take 5 mLs by  nebulization as needed for cough.  10/11/15 10/10/16 Yes Historical Provider, MD   BP 99/62 mmHg  Pulse 105  Temp(Src) 102.3 F (39.1 C) (Oral)  Resp 18  Ht '5\' 2"'$  (1.575 m)  Wt 69.4 kg  BMI 27.98 kg/m2  SpO2 95% Physical Exam  ED Course  Procedures (including critical care time) Labs Review Labs Reviewed  COMPREHENSIVE METABOLIC PANEL - Abnormal; Notable for the following:    Sodium 130 (*)    Potassium 2.9 (*)    Chloride 96 (*)    Glucose, Bld 119 (*)    Calcium 8.1 (*)    Alkaline Phosphatase 162 (*)    All other components within normal limits  URINALYSIS, ROUTINE W REFLEX MICROSCOPIC (NOT AT [facility]) - Abnormal; Notable for the following:    Specific Gravity, Urine 1.003 (*)    All other components within normal limits  CBC WITH DIFFERENTIAL/PLATELET - Abnormal; Notable for the following:    RBC 2.29 (*)    Hemoglobin 7.0 (*)    HCT 20.5 (*)    RDW 15.6 (*)    Platelets 55 (*)    Monocytes Absolute 1.9 (*)    All other components within normal limits  CULTURE, BLOOD (ROUTINE X 2)  CULTURE, BLOOD (ROUTINE X 2)  URINE CULTURE  OCCULT BLOOD X 1 CARD TO LAB, STOOL  I-STAT CG4 LACTIC ACID, ED  I-STAT CG4 LACTIC ACID, ED  POC OCCULT BLOOD, ED  I-STAT CG4 LACTIC ACID, ED  I-STAT CG4 LACTIC ACID, ED  PREPARE RBC (CROSSMATCH)  TYPE AND SCREEN    Imaging Review Dg Chest 2 View  12/19/2015  CLINICAL DATA:  Acute onset of dry cough and  right-sided chest pain. Shortness of breath. Initial encounter. EXAM: CHEST  2 VIEW COMPARISON:  CT of the chest performed 09/25/2015 FINDINGS: A small to moderate right-sided pleural effusion is noted. The patient's known right lower lobe masses are partially characterized. The left lung remains clear. No pneumothorax is seen. The heart is normal in size. A right-sided chest port is noted ending about the distal SVC. No acute osseous abnormalities are seen. A vascular stent is noted overlying the mediastinum. IMPRESSION: Small to moderate right-sided pleural effusion noted. Known right lower lobe masses are partially characterized. Electronically Signed   By: Garald Balding M.D.   On: 12/19/2015 20:47   I have personally reviewed and evaluated these images and lab results as part of my medical decision-making.   EKG Interpretation   Date/Time:  Wednesday Dec 19 2015 21:18:27 EDT Ventricular Rate:  116 PR Interval:  165 QRS Duration: 97 QT Interval:  331 QTC Calculation: 460 R Axis:   84 Text Interpretation:  Sinus tachycardia Atrial premature complex  Borderline right axis deviation since last tracing no significant change  Confirmed by Eulis Foster  MD, Vira Agar [phone]) on 12/19/2015 9:22:04 PM    I spoke with oncology here is requesting the patient received 2 units of blood, stating that most likely cause of her severe anemia is due to the Gemzar which she received on May 16,  and be admitted to the hospitalist service.  I also spoke with Dr. Karena Addison, pulmonology at [facility].  He states that she has a metal stent in her lung and does not need any special therapies for her effusion. Sepsis protocol was initiated.  Patient did receive antibiotics and fluid resuscitation per protocol.  MDM   Final diagnoses:  Anemia due to other cause  Malignant neoplasm of lower lobe of right lung (  [place])  Tachycardia  Sepsis, due to unspecified organism [facility])         Junius Creamer, NP 12/20/15 [id number]  Daleen Bo, MD 12/20/15 [phone]

## 2015-12-19 NOTE — ED Provider Notes (Signed)
  Face-to-face evaluation   History: Here for evaluation of fever, which started today, and malaise, present for 3 days. Arthroscopy last week. Cough is somewhat worse since that time. No dysuria. Some urinary hesitancy.  Physical exam: Alert, lucid, cooperative. No respiratory distress. No dysarthria or aphasia.  Medical screening examination/treatment/procedure(s) were conducted as a shared visit with non-physician practitioner(s) and myself.  I personally evaluated the patient during the encounter  Daleen Bo, MD 12/20/15 [phone]

## 2015-12-19 NOTE — Progress Notes (Signed)
Pharmacy Antibiotic Follow-up Note  [name] is a 70 y.o. year-old female admitted on 12/19/2015.  The patient is currently on day 1 of Vancomycin & Zosyn for r/o sepsis.  Assessment/Plan: Vancomycin 1gm x1, then '750mg'$   IV every 12 hours.  Goal trough 15-20 mcg/mL. Zosyn 3.375g IV q8h (4 hour infusion).  Temp (24hrs), Avg:102.3 F (39.1 C), Min:102.3 F (39.1 C), Max:102.3 F (39.1 C)   Recent Labs Lab 12/19/15 2059  WBC 8.4   No results for input(s): CREATININE in the last 168 hours. Estimated Creatinine Clearance: 49.8 mL/min (by C-G formula based on Cr of 1).    Allergies  Allergen Reactions  . Succinylcholine Other (See Comments)    Prolonged muscle weakness   . Niacin And Related Rash   Antimicrobials this admission: 5/24 Zosyn >>  5/24 Vancomycin  >>   Levels/dose changes this admission:  Microbiology results: 5/24 BCx: sent  5/24 UCx: sent   Thank you for allowing pharmacy to be a part of this patient's care.  Minda Ditto PharmD 12/19/2015 9:27 PM

## 2015-12-19 NOTE — ED Notes (Signed)
Pt states "I have felt like crap for 4 days"; pt states that she has chills and began running a fever today; pt states that she was at [facility] for a bronchoscopy last week and states "I think I have pneunomia"; pt states that she has chronic shortness of breath; pt states that she has a cough but that it is more than usual;pt states that she it is mostly a nonproductive cough but has been productive at times

## 2015-12-19 NOTE — ED Notes (Signed)
Nurse in the room doing a ultrasound IV

## 2015-12-19 NOTE — ED Notes (Signed)
Pt ambulated to the restroom with no distress or difficulty.

## 2015-12-19 NOTE — H&P (Signed)
[facility] Admission History and Physical       [name] JJK:[id number] DOB: 03-Sep-1945 DOA: 12/19/2015  Referring physician: EDP PCP: Asencion Noble, MD  Specialists:   Chief Complaint: Fevers and Cough  HPI: [name] is a 70 y.o. female with a history of [place] Lung Cancer (dx 03/2014 S/P Chemo And Radiation Rx and Immunotherapy on current Chemo Rx with Last Chemo Rx 8 days ago) who presents to the ED with complaints of increased Cough and Fevers and Chills x 4 days.  She reports having a fever to 102 at home today.   Her cough has been non-productive.  She was found to have Fever and Tachycardia and Hypotension in the ED and the Sepsis Protocol was initiated.    She was placed on IV Vancomycin and Zosyn.  Her labs also revealed a drop in her hemoglobin from 10.5 to 7.0.   A transfusion of 1 unit was ordered.        Of Note:  She is followed at [facility] by Pulmonology due to Pneumonitis which occurred after Immunotherapy and had a recent Bronchoscopy at [facility] 1 week ago to evaluate her Right Lung Stent.       Review of Systems:    Constitutional: No Weight Loss, No Weight Gain, Night Sweats, +Fevers, +Chills, Dizziness, Light Headedness, Fatigue, or Generalized Weakness HEENT: No Headaches, Difficulty Swallowing,Tooth/Dental Problems,Sore Throat,  No Sneezing, Rhinitis, Ear Ache, Nasal Congestion, or Post Nasal Drip,  Cardio-vascular:  No Chest pain, Orthopnea, PND, Edema in Lower Extremities, Anasarca, Dizziness, Palpitations  Resp: No Dyspnea, No DOE, +Non-Productive Cough, No Hemoptysis, No Wheezing.    GI: No Heartburn, Indigestion, Abdominal Pain, Nausea, Vomiting, Diarrhea, Constipation, Hematemesis, Hematochezia, Melena, Change in Bowel Habits,  Loss of Appetite  GU: No Dysuria, No Change in Color of Urine, No Urgency or Urinary Frequency, No Flank pain.  Musculoskeletal: No Joint Pain or Swelling, No Decreased Range of Motion, No Back Pain.  Neurologic: No Syncope, No  Seizures, Muscle Weakness, Paresthesia, Vision Disturbance or Loss, No Diplopia, No Vertigo, No Difficulty Walking,  Skin: No Rash or Lesions. Psych: No Change in Mood or Affect, No Depression or Anxiety, No Memory loss, No Confusion, or Hallucinations   Past Medical History  Diagnosis Date  . Thyroid disease     hypothyroidism  . Hx of thyroid cancer   . Status post bilateral breast implants   . Consolidation lung ([place]) 03/21/14    RLL PER CXR/CT  . Hypertension     just started,not on medication  . Shortness of breath     exertion  . Hypothyroidism   . Mitral valve prolapse   . Anxiety   . Arthritis   . Fibromyalgia   . Complication of anesthesia     headaches,very agitated while waking  up  . Cancer ([place])     thyroid  . Lung cancer ([place])     2015  . Encounter for antineoplastic immunotherapy 07/05/2015  . Hypothyroidism 07/26/2015  . Worsening headaches 11/13/2015     Past Surgical History  Procedure Laterality Date  . Thyroidectomy    . Cataract extraction Right   . Breast enhancement surgery    . Retinal detachment surgery    . Eye surgery    . Pars plana vitrectomy w/ repair of macular hole    . Back surgery      times 2  . Tonsillectomy    . Cervical cone biopsy    . Video bronchoscopy with endobronchial  ultrasound N/A 04/07/2014    Procedure: VIDEO BRONCHOSCOPY WITH ENDOBRONCHIAL ULTRASOUND;  Surgeon: Melrose Nakayama, MD;  Location: [facility];  Service: Thoracic;  Laterality: N/A;      Prior to Admission medications   Medication Sig Start Date End Date Taking? Authorizing Provider  acetaminophen (TYLENOL) 325 MG tablet Take 650 mg by mouth every 6 (six) hours as needed for mild pain, moderate pain or headache.    Yes Historical Provider, MD  albuterol (PROVENTIL) (2.5 MG/3ML) 0.083% nebulizer solution Take 2.5 mg by nebulization every 6 (six) hours as needed for wheezing or shortness of breath.   Yes Historical Provider, MD  amLODipine (NORVASC) 5 MG tablet  Take 5 mg by mouth daily.  04/21/14  Yes Historical Provider, MD  aspirin EC 81 MG tablet Take 81 mg by mouth at bedtime.    Yes Historical Provider, MD  clonazePAM (KLONOPIN) 0.5 MG tablet One three times a day with meals and 2 at bedtime Patient taking differently: Take 0.5 mg by mouth as needed for anxiety.  09/25/14  Yes Tanda Rockers, MD  estradiol (ESTRACE) 0.5 MG tablet Take 0.5 mg by mouth daily.   Yes Historical Provider, MD  famotidine (PEPCID) 20 MG tablet Take 20 mg by mouth daily as needed for heartburn or indigestion.  08/14/14  Yes Historical Provider, MD  FLUoxetine (PROZAC) 20 MG tablet Take 20 mg by mouth daily.    Yes Historical Provider, MD  HYDROcodone-homatropine (HYCODAN) 5-1.5 MG/5ML syrup Take 5 mLs by mouth every 6 (six) hours as needed for cough. 11/23/15  Yes Curt Bears, MD  ibuprofen (ADVIL,MOTRIN) 100 MG tablet Take 200 mg by mouth every 6 (six) hours as needed for fever.   Yes Historical Provider, MD  levothyroxine (SYNTHROID, LEVOTHROID) 175 MCG tablet Take 175 mcg by mouth daily.   Yes Historical Provider, MD  lidocaine-prilocaine (EMLA) cream Apply 1 application topically as needed. Patient taking differently: Apply 1 application topically as needed (pain).  10/19/15  Yes Susanne Borders, NP  medroxyPROGESTERone (PROVERA) 2.5 MG tablet Take 2.5 mg by mouth daily.   Yes Historical Provider, MD  morphine (MSIR) 15 MG tablet Take 1 tablet (15 mg total) by mouth every 6 (six) hours as needed for severe pain (pain). 10/18/15  Yes Susanne Borders, NP  predniSONE (DELTASONE) 10 MG tablet One tab po qd Patient taking differently: Take 10 mg by mouth daily with breakfast.  12/04/15  Yes Curt Bears, MD  prochlorperazine (COMPAZINE) 10 MG tablet Take 1 tablet (10 mg total) by mouth every 6 (six) hours as needed for nausea or vomiting. 10/18/15  Yes Susanne Borders, NP  sodium chloride HYPERTONIC 3 % nebulizer solution Take 5 mLs by nebulization as needed for cough.  10/11/15  10/10/16 Yes Historical Provider, MD     Allergies  Allergen Reactions  . Succinylcholine Other (See Comments)    Prolonged muscle weakness   . Niacin And Related Rash    Social History:  reports that she quit smoking about 21 months ago. Her smoking use included Cigarettes. She has a 24.5 pack-year smoking history. She has never used smokeless tobacco. She reports that she drinks alcohol. She reports that she uses illicit drugs (Marijuana).    Family History  Problem Relation Age of Onset  . Early death Mother   . Heart disease Mother   . Early death Father   . Heart disease Father     MI  . Cancer Sister  BREAST  . Heart disease Sister     MI 2013       Physical Exam:  GEN:  Pleasant Well Developed 70 y.o. Caucasian female examined and in no acute distress; cooperative with exam Filed Vitals:   12/19/15 2130 12/19/15 2208 12/19/15 2230 12/19/15 2300  BP: 112/69 95/69 105/64 99/62  Pulse: 112 111 109 105  Temp:      TempSrc:      Resp: '21 20 16 18  '$ Height:      Weight:      SpO2: 95% 96% 97% 95%   Blood pressure 99/62, pulse 105, temperature 102.3 F (39.1 C), temperature source Oral, resp. rate 18, height '5\' 2"'$  (1.575 m), weight 69.4 kg (153 lb), SpO2 95 %. PSYCH: She is alert and oriented x4; does not appear anxious does not appear depressed; affect is normal HEENT: Normocephalic and Atraumatic, Mucous membranes pink; PERRLA; EOM intact; Fundi:  Benign;  No scleral icterus, Nares: Patent, Oropharynx: Clear, Fair Dentition,    Neck:  FROM, No Cervical Lymphadenopathy nor Thyromegaly or Carotid Bruit; No JVD; Breasts:: Not examined CHEST WALL: No tenderness CHEST: Normal respiration, clear to auscultation bilaterally HEART: Tachycardic Regular Rhythm; no murmurs rubs or gallops BACK: No kyphosis or scoliosis; No CVA tenderness ABDOMEN: Positive Bowel Sounds, Soft Non-Tender, No Rebound or Guarding; No Masses, No Organomegaly. Rectal Exam: Not done EXTREMITIES:  No Cyanosis, Clubbing, or Edema; No Ulcerations. Genitalia: not examined PULSES: 2+ and symmetric SKIN: Normal hydration no rash or ulceration CNS:  Alert and Oriented x 4, No Focal Deficits Vascular: pulses palpable throughout    Labs on Admission:  Basic Metabolic Panel:  Recent Labs Lab 12/19/15 2059  NA 130*  K 2.9*  CL 96*  CO2 22  GLUCOSE 119*  BUN 8  CREATININE 0.66  CALCIUM 8.1*   Liver Function Tests:  Recent Labs Lab 12/19/15 2059  AST 24  ALT 32  ALKPHOS 162*  BILITOT 0.7  PROT 6.7  ALBUMIN 3.6   No results for input(s): LIPASE, AMYLASE in the last 168 hours. No results for input(s): AMMONIA in the last 168 hours. CBC:  Recent Labs Lab 12/19/15 2059  WBC 8.4  NEUTROABS 4.8  HGB 7.0*  HCT 20.5*  MCV 89.5  PLT 55*   Cardiac Enzymes: No results for input(s): CKTOTAL, CKMB, CKMBINDEX, TROPONINI in the last 168 hours.  BNP (last 3 results) No results for input(s): BNP in the last 8760 hours.  ProBNP (last 3 results) No results for input(s): PROBNP in the last 8760 hours.  CBG: No results for input(s): GLUCAP in the last 168 hours.  Radiological Exams on Admission: Dg Chest 2 View  12/19/2015  CLINICAL DATA:  Acute onset of dry cough and right-sided chest pain. Shortness of breath. Initial encounter. EXAM: CHEST  2 VIEW COMPARISON:  CT of the chest performed 09/25/2015 FINDINGS: A small to moderate right-sided pleural effusion is noted. The patient's known right lower lobe masses are partially characterized. The left lung remains clear. No pneumothorax is seen. The heart is normal in size. A right-sided chest port is noted ending about the distal SVC. No acute osseous abnormalities are seen. A vascular stent is noted overlying the mediastinum. IMPRESSION: Small to moderate right-sided pleural effusion noted. Known right lower lobe masses are partially characterized. Electronically Signed   By: Garald Balding M.D.   On: 12/19/2015 20:47     EKG:  Independently reviewed. Sinus Tachycardia rate = 116  Assessment/Plan:     70 y.o. 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    IVFs with NSS   Monitor Na+ Trend    Thrombocytopenia (HCC)- Most Likely Chemo Induced   Monitor Trend of PLTs     Hypotension- due to Sepsis   IVFs   Hold Anti-Hypertensives   Monitor BPs    Hypothyroidism   Continue Levothyroxine Rx   DVT Prophylaxis   SCDs    Code Status:     FULL CODE       Family Communication:   Husband at Bedside    Disposition Plan:    Inpatient Status        Time spent:  59 Minutes      Theressa Millard [facility] Pager [phone]   If 7AM -7PM Please Contact the Day Rounding Team MD for [facility]  If 7PM-7AM, Please Contact Night-Floor Coverage  [email or web address] Password [email or web address] 12/19/2015, 11:59 PM     ADDENDUM:   Patient was seen and examined on 12/19/2015

## 2015-12-20 ENCOUNTER — Ambulatory Visit (HOSPITAL_COMMUNITY): Payer: Medicare Other

## 2015-12-20 DIAGNOSIS — D696 Thrombocytopenia, unspecified: Secondary | ICD-10-CM | POA: Diagnosis present

## 2015-12-20 DIAGNOSIS — Y95 Nosocomial condition: Secondary | ICD-10-CM | POA: Diagnosis present

## 2015-12-20 DIAGNOSIS — R509 Fever, unspecified: Secondary | ICD-10-CM | POA: Diagnosis present

## 2015-12-20 DIAGNOSIS — A419 Sepsis, unspecified organism: Secondary | ICD-10-CM | POA: Diagnosis present

## 2015-12-20 DIAGNOSIS — D6481 Anemia due to antineoplastic chemotherapy: Secondary | ICD-10-CM | POA: Diagnosis present

## 2015-12-20 DIAGNOSIS — Z87891 Personal history of nicotine dependence: Secondary | ICD-10-CM | POA: Diagnosis not present

## 2015-12-20 DIAGNOSIS — E222 Syndrome of inappropriate secretion of antidiuretic hormone: Secondary | ICD-10-CM | POA: Diagnosis present

## 2015-12-20 DIAGNOSIS — T451X5A Adverse effect of antineoplastic and immunosuppressive drugs, initial encounter: Secondary | ICD-10-CM | POA: Diagnosis present

## 2015-12-20 DIAGNOSIS — F419 Anxiety disorder, unspecified: Secondary | ICD-10-CM | POA: Diagnosis present

## 2015-12-20 DIAGNOSIS — I10 Essential (primary) hypertension: Secondary | ICD-10-CM | POA: Diagnosis present

## 2015-12-20 DIAGNOSIS — E871 Hypo-osmolality and hyponatremia: Secondary | ICD-10-CM | POA: Diagnosis present

## 2015-12-20 DIAGNOSIS — Z8249 Family history of ischemic heart disease and other diseases of the circulatory system: Secondary | ICD-10-CM | POA: Diagnosis not present

## 2015-12-20 DIAGNOSIS — Z888 Allergy status to other drugs, medicaments and biological substances status: Secondary | ICD-10-CM | POA: Diagnosis not present

## 2015-12-20 DIAGNOSIS — J9589 Other postprocedural complications and disorders of respiratory system, not elsewhere classified: Secondary | ICD-10-CM | POA: Diagnosis present

## 2015-12-20 DIAGNOSIS — R000 Tachycardia, unspecified: Secondary | ICD-10-CM

## 2015-12-20 DIAGNOSIS — J189 Pneumonia, unspecified organism: Secondary | ICD-10-CM | POA: Diagnosis present

## 2015-12-20 DIAGNOSIS — Z85850 Personal history of malignant neoplasm of thyroid: Secondary | ICD-10-CM | POA: Diagnosis not present

## 2015-12-20 DIAGNOSIS — M797 Fibromyalgia: Secondary | ICD-10-CM | POA: Diagnosis present

## 2015-12-20 DIAGNOSIS — Z803 Family history of malignant neoplasm of breast: Secondary | ICD-10-CM | POA: Diagnosis not present

## 2015-12-20 DIAGNOSIS — Z7982 Long term (current) use of aspirin: Secondary | ICD-10-CM | POA: Diagnosis not present

## 2015-12-20 DIAGNOSIS — E039 Hypothyroidism, unspecified: Secondary | ICD-10-CM | POA: Diagnosis present

## 2015-12-20 DIAGNOSIS — Z9221 Personal history of antineoplastic chemotherapy: Secondary | ICD-10-CM | POA: Diagnosis not present

## 2015-12-20 DIAGNOSIS — I341 Nonrheumatic mitral (valve) prolapse: Secondary | ICD-10-CM | POA: Diagnosis present

## 2015-12-20 DIAGNOSIS — E876 Hypokalemia: Secondary | ICD-10-CM | POA: Diagnosis present

## 2015-12-20 DIAGNOSIS — Z923 Personal history of irradiation: Secondary | ICD-10-CM | POA: Diagnosis not present

## 2015-12-20 DIAGNOSIS — I959 Hypotension, unspecified: Secondary | ICD-10-CM | POA: Diagnosis present

## 2015-12-20 DIAGNOSIS — R6521 Severe sepsis with septic shock: Secondary | ICD-10-CM

## 2015-12-20 DIAGNOSIS — Z9882 Breast implant status: Secondary | ICD-10-CM | POA: Diagnosis not present

## 2015-12-20 DIAGNOSIS — F1290 Cannabis use, unspecified, uncomplicated: Secondary | ICD-10-CM | POA: Diagnosis present

## 2015-12-20 DIAGNOSIS — Z79899 Other long term (current) drug therapy: Secondary | ICD-10-CM | POA: Diagnosis not present

## 2015-12-20 DIAGNOSIS — I9589 Other hypotension: Secondary | ICD-10-CM | POA: Diagnosis not present

## 2015-12-20 DIAGNOSIS — C3431 Malignant neoplasm of lower lobe, right bronchus or lung: Secondary | ICD-10-CM | POA: Diagnosis present

## 2015-12-20 LAB — PROTIME-INR
INR: 1.2 (ref 0.00–1.49)
PROTHROMBIN TIME: 15.4 s — AB (ref 11.6–15.2)

## 2015-12-20 LAB — PREPARE RBC (CROSSMATCH)

## 2015-12-20 LAB — BASIC METABOLIC PANEL
Anion gap: 7 (ref 5–15)
BUN: 7 mg/dL (ref 6–20)
CALCIUM: 7.5 mg/dL — AB (ref 8.9–10.3)
CO2: 24 mmol/L (ref 22–32)
CREATININE: 0.6 mg/dL (ref 0.44–1.00)
Chloride: 106 mmol/L (ref 101–111)
GFR calc Af Amer: >60 mL/min (ref >60)
GLUCOSE: 115 mg/dL — AB (ref 65–99)
POTASSIUM: 3.5 mmol/L (ref 3.5–5.1)
Sodium: 137 mmol/L (ref 135–145)

## 2015-12-20 LAB — CBC
HEMATOCRIT: 23.3 % — AB (ref 36.0–46.0)
Hemoglobin: 8 g/dL — ABNORMAL LOW (ref 12.0–15.0)
MCH: 31.4 pg (ref 26.0–34.0)
MCHC: 34.3 g/dL (ref 30.0–36.0)
MCV: 91.4 fL (ref 78.0–100.0)
Platelets: 48 10*3/uL — ABNORMAL LOW (ref 150–400)
RBC: 2.55 MIL/uL — ABNORMAL LOW (ref 3.87–5.11)
RDW: 15.4 % (ref 11.5–15.5)
WBC: 6.4 10*3/uL (ref 4.0–10.5)

## 2015-12-20 LAB — HEMOGLOBIN AND HEMATOCRIT, BLOOD
HCT: 27 % — ABNORMAL LOW (ref 36.0–46.0)
HEMOGLOBIN: 9.3 g/dL — AB (ref 12.0–15.0)

## 2015-12-20 LAB — MAGNESIUM: Magnesium: 1.2 mg/dL — ABNORMAL LOW (ref 1.7–2.4)

## 2015-12-20 LAB — PROCALCITONIN

## 2015-12-20 LAB — APTT: APTT: 34 s (ref 24–37)

## 2015-12-20 LAB — LACTIC ACID, PLASMA
LACTIC ACID, VENOUS: 0.8 mmol/L (ref 0.5–2.0)
Lactic Acid, Venous: 1 mmol/L (ref 0.5–2.0)

## 2015-12-20 LAB — ABO/RH: ABO/RH(D): AB POS

## 2015-12-20 LAB — MRSA PCR SCREENING: MRSA by PCR: NEGATIVE

## 2015-12-20 MED ORDER — MEDROXYPROGESTERONE ACETATE 2.5 MG PO TABS
2.5000 mg | ORAL_TABLET | Freq: Every day | ORAL | Status: DC
  Administered 2015-12-20 – 2015-12-21 (×2): 2.5 mg via ORAL
  Filled 2015-12-20 (×2): qty 1

## 2015-12-20 MED ORDER — ALBUTEROL SULFATE (2.5 MG/3ML) 0.083% IN NEBU: 2.5000 mg | INHALATION_SOLUTION | Freq: Four times a day (QID) | RESPIRATORY_TRACT | Status: DC | PRN

## 2015-12-20 MED ORDER — HYDROMORPHONE HCL 1 MG/ML IJ SOLN: 0.5000 mg | INTRAMUSCULAR | Status: DC | PRN

## 2015-12-20 MED ORDER — CLONAZEPAM 0.5 MG PO TABS
0.5000 mg | ORAL_TABLET | Freq: Two times a day (BID) | ORAL | Status: DC | PRN
  Administered 2015-12-20: 0.5 mg via ORAL
  Filled 2015-12-20: qty 1

## 2015-12-20 MED ORDER — HYDROCOD POLST-CPM POLST ER 10-8 MG/5ML PO SUER
5.0000 mL | Freq: Two times a day (BID) | ORAL | Status: DC
  Administered 2015-12-20: 5 mL via ORAL
  Filled 2015-12-20: qty 5

## 2015-12-20 MED ORDER — SODIUM CHLORIDE 0.9 % IV BOLUS (SEPSIS)
500.0000 mL | Freq: Once | INTRAVENOUS | Status: AC
  Administered 2015-12-20: 500 mL via INTRAVENOUS

## 2015-12-20 MED ORDER — LEVOTHYROXINE SODIUM 50 MCG PO TABS
175.0000 ug | ORAL_TABLET | Freq: Every day | ORAL | Status: DC
  Administered 2015-12-20 – 2015-12-21 (×2): 175 ug via ORAL
  Filled 2015-12-20 (×3): qty 1

## 2015-12-20 MED ORDER — ASPIRIN EC 81 MG PO TBEC
81.0000 mg | DELAYED_RELEASE_TABLET | Freq: Every day | ORAL | Status: DC
  Administered 2015-12-20 (×2): 81 mg via ORAL
  Filled 2015-12-20 (×2): qty 1

## 2015-12-20 MED ORDER — ONDANSETRON HCL 4 MG PO TABS: 4.0000 mg | ORAL_TABLET | Freq: Four times a day (QID) | ORAL | Status: DC | PRN

## 2015-12-20 MED ORDER — ESTRADIOL 1 MG PO TABS
0.5000 mg | ORAL_TABLET | Freq: Every day | ORAL | Status: DC
  Administered 2015-12-20 – 2015-12-21 (×2): 0.5 mg via ORAL
  Filled 2015-12-20 (×2): qty 0.5

## 2015-12-20 MED ORDER — PREDNISONE 5 MG PO TABS
10.0000 mg | ORAL_TABLET | Freq: Every day | ORAL | Status: DC
  Administered 2015-12-20 – 2015-12-21 (×2): 10 mg via ORAL
  Filled 2015-12-20: qty 2
  Filled 2015-12-20: qty 1

## 2015-12-20 MED ORDER — FLUOXETINE HCL 20 MG PO CAPS
20.0000 mg | ORAL_CAPSULE | Freq: Every day | ORAL | Status: DC
  Administered 2015-12-20 – 2015-12-21 (×2): 20 mg via ORAL
  Filled 2015-12-20 (×2): qty 1

## 2015-12-20 MED ORDER — HYDROCODONE-HOMATROPINE 5-1.5 MG/5ML PO SYRP
5.0000 mL | ORAL_SOLUTION | Freq: Four times a day (QID) | ORAL | Status: DC | PRN
  Administered 2015-12-20 (×2): 5 mL via ORAL
  Filled 2015-12-20 (×2): qty 5

## 2015-12-20 MED ORDER — MORPHINE SULFATE 15 MG PO TABS: 15.0000 mg | ORAL_TABLET | Freq: Four times a day (QID) | ORAL | Status: DC | PRN

## 2015-12-20 MED ORDER — POTASSIUM CHLORIDE 10 MEQ/100ML IV SOLN
10.0000 meq | INTRAVENOUS | Status: AC
  Administered 2015-12-20 (×2): 10 meq via INTRAVENOUS
  Filled 2015-12-20 (×2): qty 100

## 2015-12-20 MED ORDER — FAMOTIDINE 20 MG PO TABS: 20.0000 mg | ORAL_TABLET | Freq: Every day | ORAL | Status: DC | PRN

## 2015-12-20 MED ORDER — ACETAMINOPHEN 650 MG RE SUPP
650.0000 mg | Freq: Four times a day (QID) | RECTAL | Status: DC | PRN
Start: 2015-12-20 — End: 2015-12-21

## 2015-12-20 MED ORDER — ONDANSETRON HCL 4 MG/2ML IJ SOLN: 4.0000 mg | Freq: Four times a day (QID) | INTRAMUSCULAR | Status: DC | PRN

## 2015-12-20 MED ORDER — ACETAMINOPHEN 325 MG PO TABS
650.0000 mg | ORAL_TABLET | Freq: Four times a day (QID) | ORAL | Status: DC | PRN
  Administered 2015-12-20: 650 mg via ORAL
  Filled 2015-12-20: qty 2

## 2015-12-20 MED ORDER — OXYCODONE HCL 5 MG PO TABS
5.0000 mg | ORAL_TABLET | ORAL | Status: DC | PRN
Start: 2015-12-20 — End: 2015-12-21

## 2015-12-20 MED ORDER — POTASSIUM CHLORIDE CRYS ER 20 MEQ PO TBCR
40.0000 meq | EXTENDED_RELEASE_TABLET | Freq: Once | ORAL | Status: AC
  Administered 2015-12-20: 40 meq via ORAL
  Filled 2015-12-20: qty 2

## 2015-12-20 MED ORDER — SODIUM CHLORIDE 0.9 % IV SOLN
INTRAVENOUS | Status: DC
  Administered 2015-12-20: 02:00:00 via INTRAVENOUS

## 2015-12-20 NOTE — Care Management Note (Signed)
Case Management Note  Patient Details  Name: [name] MRN: [id number] Date of Birth: 25-May-1946  Subjective/Objective:             anemia       Action/Plan:Date:  Dec 20, 2015 Chart reviewed for concurrent status and case management needs. Will continue to follow patient for changes and needs: Expected discharge date: [id number] Velva Harman, BSN, [place], [place]   Expected Discharge Date:                  Expected Discharge Plan:  Home/Self Care  In-House Referral:  NA  Discharge planning Services  CM Consult  Post Acute Care Choice:  NA Choice offered to:  NA  DME Arranged:    DME Agency:     HH Arranged:    [place] Agency:     Status of Service:  In process, will continue to follow  Medicare Important Message Given:    Date Medicare IM Given:    Medicare IM give by:    Date Additional Medicare IM Given:    Additional Medicare Important Message give by:     If discussed at [place] of Stay Meetings, dates discussed:    Additional Comments:  Leeroy Cha, RN 12/20/2015, 8:51 AM

## 2015-12-20 NOTE — Progress Notes (Signed)
PROGRESS NOTE  [name]  NWG:[id number] DOB: February 13, 1946 DOA: 12/19/2015 PCP: Asencion Noble, MD Outpatient Specialists:  Subjective: Presented with fever and cough, seen with husband at bedside feel much better.  Brief Narrative:  70 year old female has history of NSCLC, has had bronchial stent placed about 2 weeks ago at [facility].  Assessment & Plan:   Principal Problem:   Sepsis ([place]) Active Problems:   Non-small cell cancer of right lung (HCC)   Pneumonia   Hypothyroidism   Anemia due to chemotherapy   Hypokalemia   Hyponatremia   Thrombocytopenia (HCC)   Septic shock (HCC)   Hypotension   This is a no charge note, patient seen earlier today by my colleague Dr. Arnoldo Morale. I have seen and examined the patient, reviewed the chart. Presented with fever, cough and found to have anemia. Status post transfusion of 1 packed RBCs. Hypokalemia, thrombocytopenia and anemia.   DVT prophylaxis:  Code Status: Full Code Family Communication:  Disposition Plan:  Diet: Diet regular Room service appropriate?: Yes; Fluid consistency:: Thin  Consultants:   None  Procedures:   None  Antimicrobials:   Zosyn and vancomycin.  Objective: Filed Vitals:   12/20/15 0600 12/20/15 0700 12/20/15 0800 12/20/15 0900  BP: 126/80 122/75 109/52 119/81  Pulse: 114 114 111 115  Temp: 99.8 F (37.7 C)  100 F (37.8 C)   TempSrc: Oral  Oral   Resp: '30 25 31 '$ 34  Height:      Weight:      SpO2: 95% 95% 96% 96%    Intake/Output Summary (Last 24 hours) at 12/20/15 0927 Last data filed at 12/20/15 0900  Gross per 24 hour  Intake 2481.25 ml  Output    400 ml  Net 2081.25 ml   Filed Weights   12/19/15 2016 12/20/15 0130  Weight: 69.4 kg (153 lb) 70.9 kg (156 lb 4.9 oz)    Examination: General exam: Appears calm and comfortable  Respiratory system: Clear to auscultation. Respiratory effort normal. Cardiovascular system: S1 & S2 heard, RRR. No JVD, murmurs, rubs, gallops or clicks. No  pedal edema. Gastrointestinal system: Abdomen is nondistended, soft and nontender. No organomegaly or masses felt. Normal bowel sounds heard. Central nervous system: Alert and oriented. No focal neurological deficits. Extremities: Symmetric 5 x 5 power. Skin: No rashes, lesions or ulcers Psychiatry: Judgement and insight appear normal. Mood & affect appropriate.   Data Reviewed: I have personally reviewed following labs and imaging studies  CBC:  Recent Labs Lab 12/19/15 2059 12/20/15 0440 12/20/15 0855  WBC 8.4 6.4  --   NEUTROABS 4.8  --   --   HGB 7.0* 8.0* 9.3*  HCT 20.5* 23.3* 27.0*  MCV 89.5 91.4  --   PLT 55* 48*  --    Basic Metabolic Panel:  Recent Labs Lab 12/19/15 2059 12/20/15 0210 12/20/15 0440  NA 130*  --  137  K 2.9*  --  3.5  CL 96*  --  106  CO2 22  --  24  GLUCOSE 119*  --  115*  BUN 8  --  7  CREATININE 0.66  --  0.60  CALCIUM 8.1*  --  7.5*  MG  --  1.2*  --    GFR: Estimated Creatinine Clearance: 68.8 mL/min (by C-G formula based on Cr of 0.6). Liver Function Tests:  Recent Labs Lab 12/19/15 2059  AST 24  ALT 32  ALKPHOS 162*  BILITOT 0.7  PROT 6.7  ALBUMIN 3.6   No results  for input(s): LIPASE, AMYLASE in the last 168 hours. No results for input(s): AMMONIA in the last 168 hours. Coagulation Profile:  Recent Labs Lab 12/20/15 0210  INR 1.20   Cardiac Enzymes: No results for input(s): CKTOTAL, CKMB, CKMBINDEX, TROPONINI in the last 168 hours. BNP (last 3 results) No results for input(s): PROBNP in the last 8760 hours. HbA1C: No results for input(s): HGBA1C in the last 72 hours. CBG: No results for input(s): GLUCAP in the last 168 hours. Lipid Profile: No results for input(s): CHOL, HDL, LDLCALC, TRIG, CHOLHDL, LDLDIRECT in the last 72 hours. Thyroid Function Tests: No results for input(s): TSH, T4TOTAL, FREET4, T3FREE, THYROIDAB in the last 72 hours. Anemia Panel: No results for input(s): VITAMINB12, FOLATE, FERRITIN,  TIBC, IRON, RETICCTPCT in the last 72 hours. Urine analysis:    Component Value Date/Time   COLORURINE YELLOW 12/19/2015 2054   APPEARANCEUR CLEAR 12/19/2015 2054   LABSPEC 1.003* 12/19/2015 2054   LABSPEC 1.005 05/19/2014 0957   PHURINE 7.0 12/19/2015 2054   PHURINE 6.5 05/19/2014 0957   GLUCOSEU NEGATIVE 12/19/2015 2054   GLUCOSEU Negative 05/19/2014 0957   HGBUR NEGATIVE 12/19/2015 2054   HGBUR Negative 05/19/2014 0957   BILIRUBINUR NEGATIVE 12/19/2015 2054   BILIRUBINUR Negative 05/19/2014 0957   KETONESUR NEGATIVE 12/19/2015 2054   KETONESUR Negative 05/19/2014 0957   PROTEINUR NEGATIVE 12/19/2015 2054   PROTEINUR Negative 05/19/2014 0957   UROBILINOGEN 0.2 05/19/2014 0957   NITRITE NEGATIVE 12/19/2015 2054   NITRITE Negative 05/19/2014 0957   LEUKOCYTESUR NEGATIVE 12/19/2015 2054   LEUKOCYTESUR Negative 05/19/2014 0957   Sepsis Labs: '@LABRCNTIP'$ (procalcitonin:4,lacticidven:4)  ) Recent Results (from the past 240 hour(s))  MRSA PCR Screening     Status: None   Collection Time: 12/20/15  1:36 AM  Result Value Ref Range Status   MRSA by PCR NEGATIVE NEGATIVE Final    Comment:        The GeneXpert MRSA Assay (FDA approved for NASAL specimens only), is one component of a comprehensive MRSA colonization surveillance program. It is not intended to diagnose MRSA infection nor to guide or monitor treatment for MRSA infections.      Invalid input(s): PROCALCITONIN, LACTICACIDVEN   Radiology Studies: Dg Chest 2 View  12/19/2015  CLINICAL DATA:  Acute onset of dry cough and right-sided chest pain. Shortness of breath. Initial encounter. EXAM: CHEST  2 VIEW COMPARISON:  CT of the chest performed 09/25/2015 FINDINGS: A small to moderate right-sided pleural effusion is noted. The patient's known right lower lobe masses are partially characterized. The left lung remains clear. No pneumothorax is seen. The heart is normal in size. A right-sided chest port is noted ending  about the distal SVC. No acute osseous abnormalities are seen. A vascular stent is noted overlying the mediastinum. IMPRESSION: Small to moderate right-sided pleural effusion noted. Known right lower lobe masses are partially characterized. Electronically Signed   By: Garald Balding M.D.   On: 12/19/2015 20:47        Scheduled Meds: . aspirin EC  81 mg Oral QHS  . estradiol  0.5 mg Oral Daily  . FLUoxetine  20 mg Oral Daily  . levothyroxine  175 mcg Oral QAC breakfast  . medroxyPROGESTERone  2.5 mg Oral Daily  . piperacillin-tazobactam (ZOSYN)  IV  3.375 g Intravenous Q8H  . predniSONE  10 mg Oral Q breakfast  . vancomycin  750 mg Intravenous Q12H   Continuous Infusions: . sodium chloride 75 mL/hr at 12/20/15 0900     LOS: 0  days    Time spent: 35 minutes    Bertrum Helmstetter A, MD [facility] Pager [phone]  If 7PM-7AM, please contact night-coverage [email or web address] Password [facility] 12/20/2015, 9:27 AM

## 2015-12-20 NOTE — ED Notes (Signed)
Disregard SpO2. Sensor was not placed correctly. Validated by mistake.

## 2015-12-21 ENCOUNTER — Telehealth: Payer: Self-pay | Admitting: Internal Medicine

## 2015-12-21 ENCOUNTER — Other Ambulatory Visit: Payer: Self-pay | Admitting: Medical Oncology

## 2015-12-21 DIAGNOSIS — I9589 Other hypotension: Secondary | ICD-10-CM

## 2015-12-21 DIAGNOSIS — J189 Pneumonia, unspecified organism: Secondary | ICD-10-CM

## 2015-12-21 DIAGNOSIS — R6521 Severe sepsis with septic shock: Secondary | ICD-10-CM

## 2015-12-21 DIAGNOSIS — T451X5A Adverse effect of antineoplastic and immunosuppressive drugs, initial encounter: Secondary | ICD-10-CM

## 2015-12-21 DIAGNOSIS — C3491 Malignant neoplasm of unspecified part of right bronchus or lung: Secondary | ICD-10-CM

## 2015-12-21 DIAGNOSIS — E871 Hypo-osmolality and hyponatremia: Secondary | ICD-10-CM

## 2015-12-21 DIAGNOSIS — D696 Thrombocytopenia, unspecified: Secondary | ICD-10-CM

## 2015-12-21 DIAGNOSIS — D6481 Anemia due to antineoplastic chemotherapy: Secondary | ICD-10-CM

## 2015-12-21 DIAGNOSIS — E876 Hypokalemia: Secondary | ICD-10-CM

## 2015-12-21 LAB — CBC
HEMATOCRIT: 28 % — AB (ref 36.0–46.0)
HEMOGLOBIN: 9.8 g/dL — AB (ref 12.0–15.0)
MCH: 31.6 pg (ref 26.0–34.0)
MCHC: 35 g/dL (ref 30.0–36.0)
MCV: 90.3 fL (ref 78.0–100.0)
Platelets: 62 10*3/uL — ABNORMAL LOW (ref 150–400)
RBC: 3.1 MIL/uL — AB (ref 3.87–5.11)
RDW: 15.3 % (ref 11.5–15.5)
WBC: 6.4 10*3/uL (ref 4.0–10.5)

## 2015-12-21 LAB — TYPE AND SCREEN
ABO/RH(D): AB POS
Antibody Screen: NEGATIVE
UNIT DIVISION: 0
UNIT DIVISION: 0

## 2015-12-21 LAB — BASIC METABOLIC PANEL
Anion gap: 7 (ref 5–15)
BUN: 9 mg/dL (ref 6–20)
CALCIUM: 8.2 mg/dL — AB (ref 8.9–10.3)
CO2: 22 mmol/L (ref 22–32)
CREATININE: 0.61 mg/dL (ref 0.44–1.00)
Chloride: 110 mmol/L (ref 101–111)
Glucose, Bld: 114 mg/dL — ABNORMAL HIGH (ref 65–99)
Potassium: 3.8 mmol/L (ref 3.5–5.1)
SODIUM: 139 mmol/L (ref 135–145)

## 2015-12-21 LAB — URINE CULTURE

## 2015-12-21 MED ORDER — HEPARIN SOD (PORK) LOCK FLUSH 100 UNIT/ML IV SOLN
500.0000 [IU] | INTRAVENOUS | Status: AC | PRN
  Administered 2015-12-21: 500 [IU]

## 2015-12-21 MED ORDER — AMOXICILLIN-POT CLAVULANATE 875-125 MG PO TABS: 1.0000 | ORAL_TABLET | Freq: Two times a day (BID) | ORAL | Status: AC

## 2015-12-21 NOTE — Telephone Encounter (Signed)
left msg to confirm 5/30 apts were cancelled per pof, pt advised top come in and get barium for CT scan

## 2015-12-21 NOTE — Discharge Summary (Signed)
Physician Discharge Summary  [name] NFA:[id number] DOB: Feb 17, 1946 DOA: 12/19/2015  PCP: Asencion Noble, MD  Admit date: 12/19/2015 Discharge date: 12/21/2015  Time spent: 40 minutes  Recommendations for Outpatient Follow-up:  1. Follow up with PCP in 1 week   Discharge Diagnoses:  Principal Problem:   Sepsis ([place]) Active Problems:   Non-small cell cancer of right lung ([place])   Pneumonia   Hypothyroidism   Anemia due to chemotherapy   Hypokalemia   Hyponatremia   Thrombocytopenia (HCC)   Septic shock (HCC)   Hypotension   Discharge Condition: Stable  Diet recommendation: Heart Healthy  Filed Weights   12/19/15 2016 12/20/15 0130  Weight: 69.4 kg (153 lb) 70.9 kg (156 lb 4.9 oz)    History of present illness:  [name] is a 70 y.o. female with a history of [place] Lung Cancer (dx 03/2014 S/P Chemo And Radiation Rx and Immunotherapy on current Chemo Rx with Last Chemo Rx 8 days ago) who presents to the ED with complaints of increased Cough and Fevers and Chills x 4 days. She reports having a fever to 102 at home today. Her cough has been non-productive. She was found to have Fever and Tachycardia and Hypotension in the ED and the Sepsis Protocol was initiated. She was placed on IV Vancomycin and Zosyn. Her labs also revealed a drop in her hemoglobin from 10.5 to 7.0. A transfusion of 1 unit was ordered.   Of Note: She is followed at [facility] by Pulmonology due to Pneumonitis which occurred after Immunotherapy and had a recent Bronchoscopy at [facility] 1 week ago to evaluate her Right Lung Stent.  Hospital Course:   Sepsis (HCC)/Septic Shock Presented with temperature of 102.3, heart rate of 135 and probable infection. Started on broad-spectrum antibiotics, Zosyn and vancomycin. Also started on IV fluids. Sepsis physiology resolved.  Pneumonia- Possible Post Obstructive or HCAP Patient has NSCLC, his recent bronchial stent placed at [facility]. Treated for pneumonitis  caused by chemotherapy and radiation. Cannot rule out postobstructive pneumonia. Started on vancomycin and Zosyn on admission, fever resolved. Discharge on 7 more days of Augmentin, extracted to continue her home Mucinex and bronchodilators.  Non-small cell cancer of right lung [facility]) Follow-up with oncology as outpatient.  Anemia due to chemotherapy Hemoglobin of 7.0, status post transfusion of 1 unit of packed RBCs, hemoglobin improved to 9.8 on discharge.  Hypokalemia Potassium was 2.9 on the day of admission, repleted with oral supplements.  Hyponatremia Sodium was 130 on admission, this is improved with IV fluids  Thrombocytopenia (HCC)- Most Likely Chemo Induced Platelets 55 on admission, improved to 62 exam discharge  Hypotension- due to Sepsis Transient likely secondary to sepsis syndrome, this is resolved prior to discharge.  Hypothyroidism Continue Levothyroxine Rx, recent TSH is 18.120, recommend TSH rechecked in 4 weeks.  Hypomagnesemia This repleted with IV supplements.  Procedures:  None  Consultations:  None  Discharge Exam: Filed Vitals:   12/21/15 0118 12/21/15 0527  BP: 103/75 113/77  Pulse: 100 93  Temp: 98.6 F (37 C) 98 F (36.7 C)  Resp: 16 16  General: Alert and awake, oriented x3, not in any acute distress. HEENT: anicteric sclera, pupils reactive to light and accommodation, EOMI CVS: S1-S2 clear, no murmur rubs or gallops Chest: clear to auscultation bilaterally, no wheezing, rales or rhonchi Abdomen: soft nontender, nondistended, normal bowel sounds, no organomegaly Extremities: no cyanosis, clubbing or edema noted bilaterally Neuro: Cranial nerves II-XII intact, no focal neurological deficits  Discharge Instructions  Discharge Instructions    Diet - low sodium heart healthy    Complete by:  As directed      Increase activity slowly    Complete by:  As directed           Current Discharge Medication List     START taking these medications   Details  amoxicillin-clavulanate (AUGMENTIN) 875-125 MG tablet Take 1 tablet by mouth 2 (two) times daily. Qty: 14 tablet, Refills: 0      CONTINUE these medications which have NOT CHANGED   Details  acetaminophen (TYLENOL) 325 MG tablet Take 650 mg by mouth every 6 (six) hours as needed for mild pain, moderate pain or headache.     albuterol (PROVENTIL) (2.5 MG/3ML) 0.083% nebulizer solution Take 2.5 mg by nebulization every 6 (six) hours as needed for wheezing or shortness of breath.    amLODipine (NORVASC) 5 MG tablet Take 5 mg by mouth daily.  Refills: 4    aspirin EC 81 MG tablet Take 81 mg by mouth at bedtime.     clonazePAM (KLONOPIN) 0.5 MG tablet One three times a day with meals and 2 at bedtime Qty: 75 tablet, Refills: 0    estradiol (ESTRACE) 0.5 MG tablet Take 0.5 mg by mouth daily.    famotidine (PEPCID) 20 MG tablet Take 20 mg by mouth daily as needed for heartburn or indigestion.    Associated Diagnoses: Non-small cell cancer of right lung ([place]); Encounter for antineoplastic chemotherapy; Worsening headaches    FLUoxetine (PROZAC) 20 MG tablet Take 20 mg by mouth daily.     HYDROcodone-homatropine (HYCODAN) 5-1.5 MG/5ML syrup Take 5 mLs by mouth every 6 (six) hours as needed for cough. Qty: 473 mL, Refills: 0   Associated Diagnoses: Non-small cell cancer of right lung ([place]); Hx of thyroid cancer    ibuprofen (ADVIL,MOTRIN) 100 MG tablet Take 200 mg by mouth every 6 (six) hours as needed for fever.    levothyroxine (SYNTHROID, LEVOTHROID) 175 MCG tablet Take 175 mcg by mouth daily.    lidocaine-prilocaine (EMLA) cream Apply 1 application topically as needed. Qty: 30 g, Refills: 2   Associated Diagnoses: Non-small cell cancer of right lung (HCC)    medroxyPROGESTERone (PROVERA) 2.5 MG tablet Take 2.5 mg by mouth daily.    morphine (MSIR) 15 MG tablet Take 1 tablet (15 mg total) by mouth every 6 (six) hours as needed for severe  pain (pain). Qty: 45 tablet, Refills: 0   Associated Diagnoses: Non-small cell cancer of right lung (HCC)    predniSONE (DELTASONE) 10 MG tablet One tab po qd Qty: 30 tablet, Refills: 2    prochlorperazine (COMPAZINE) 10 MG tablet Take 1 tablet (10 mg total) by mouth every 6 (six) hours as needed for nausea or vomiting. Qty: 30 tablet, Refills: 2   Associated Diagnoses: Non-small cell cancer of right lung (HCC)    sodium chloride HYPERTONIC 3 % nebulizer solution Take 5 mLs by nebulization as needed for cough.    Associated Diagnoses: Non-small cell cancer of right lung ([place]); Encounter for antineoplastic chemotherapy; Worsening headaches       Allergies  Allergen Reactions  . Succinylcholine Other (See Comments)    Prolonged muscle weakness   . Niacin And Related Rash   Follow-up Information    Follow up with Asencion Noble, MD In 1 week.   Specialty:  Internal Medicine   Contact information:   [address] [place] [address] [phone]  The results of significant diagnostics from this hospitalization (including imaging, microbiology, ancillary and laboratory) are listed below for reference.    Significant Diagnostic Studies: Dg Chest 2 View  12/19/2015  CLINICAL DATA:  Acute onset of dry cough and right-sided chest pain. Shortness of breath. Initial encounter. EXAM: CHEST  2 VIEW COMPARISON:  CT of the chest performed 09/25/2015 FINDINGS: A small to moderate right-sided pleural effusion is noted. The patient's known right lower lobe masses are partially characterized. The left lung remains clear. No pneumothorax is seen. The heart is normal in size. A right-sided chest port is noted ending about the distal SVC. No acute osseous abnormalities are seen. A vascular stent is noted overlying the mediastinum. IMPRESSION: Small to moderate right-sided pleural effusion noted. Known right lower lobe masses are partially characterized. Electronically Signed   By: Garald Balding M.D.   On: 12/19/2015 20:47   Nm Myocar Multi W/spect W/wall Motion / Ef  11/26/2015   No diagnostic ST segment changes to indicate ischemia.  Small, mild intensity, apical anterolateral defect that exhibits partial reversibility in the lateral segment. This could be due to variable soft tissue attenuation, cannot exclude a small region of ischemia.  This is a low risk study.  Nuclear stress EF: 56%.     Microbiology: Recent Results (from the past 240 hour(s))  Urine culture     Status: Abnormal   Collection Time: 12/19/15  8:54 PM  Result Value Ref Range Status   Specimen Description URINE, CLEAN CATCH  Final   Special Requests NONE  Final   Culture (A)  Final    <10,000 COLONIES/mL INSIGNIFICANT GROWTH Performed at [facility]    Report Status 12/21/2015 FINAL  Final  MRSA PCR Screening     Status: None   Collection Time: 12/20/15  1:36 AM  Result Value Ref Range Status   MRSA by PCR NEGATIVE NEGATIVE Final    Comment:        The GeneXpert MRSA Assay (FDA approved for NASAL specimens only), is one component of a comprehensive MRSA colonization surveillance program. It is not intended to diagnose MRSA infection nor to guide or monitor treatment for MRSA infections.      Labs: Basic Metabolic Panel:  Recent Labs Lab 12/19/15 2059 12/20/15 0210 12/20/15 0440 12/21/15 0440  NA 130*  --  137 139  K 2.9*  --  3.5 3.8  CL 96*  --  106 110  CO2 22  --  24 22  GLUCOSE 119*  --  115* 114*  BUN 8  --  7 9  CREATININE 0.66  --  0.60 0.61  CALCIUM 8.1*  --  7.5* 8.2*  MG  --  1.2*  --   --    Liver Function Tests:  Recent Labs Lab 12/19/15 2059  AST 24  ALT 32  ALKPHOS 162*  BILITOT 0.7  PROT 6.7  ALBUMIN 3.6   No results for input(s): LIPASE, AMYLASE in the last 168 hours. No results for input(s): AMMONIA in the last 168 hours. CBC:  Recent Labs Lab 12/19/15 2059 12/20/15 0440 12/20/15 0855 12/21/15 0440  WBC 8.4 6.4  --  6.4   NEUTROABS 4.8  --   --   --   HGB 7.0* 8.0* 9.3* 9.8*  HCT 20.5* 23.3* 27.0* 28.0*  MCV 89.5 91.4  --  90.3  PLT 55* 48*  --  62*   Cardiac Enzymes: No results for input(s): CKTOTAL, CKMB, CKMBINDEX, TROPONINI in  the last 168 hours. BNP: BNP (last 3 results) No results for input(s): BNP in the last 8760 hours.  ProBNP (last 3 results) No results for input(s): PROBNP in the last 8760 hours.  CBG: No results for input(s): GLUCAP in the last 168 hours.     Signed:  Birdie Hopes MD.  [facility] 12/21/2015, 9:04 AM

## 2015-12-25 ENCOUNTER — Ambulatory Visit: Payer: Medicare Other

## 2015-12-25 ENCOUNTER — Ambulatory Visit: Payer: Medicare Other | Admitting: Internal Medicine

## 2015-12-25 ENCOUNTER — Other Ambulatory Visit: Payer: Medicare Other

## 2015-12-25 LAB — CULTURE, BLOOD (ROUTINE X 2)
CULTURE: NO GROWTH
CULTURE: NO GROWTH

## 2015-12-31 ENCOUNTER — Telehealth: Payer: Self-pay | Admitting: Internal Medicine

## 2015-12-31 ENCOUNTER — Other Ambulatory Visit: Payer: Self-pay | Admitting: Medical Oncology

## 2016-01-01 ENCOUNTER — Telehealth: Payer: Self-pay | Admitting: *Deleted

## 2016-01-01 ENCOUNTER — Ambulatory Visit: Payer: Medicare Other

## 2016-01-01 ENCOUNTER — Telehealth: Payer: Self-pay | Admitting: Internal Medicine

## 2016-01-01 ENCOUNTER — Other Ambulatory Visit: Payer: Medicare Other

## 2016-01-01 ENCOUNTER — Ambulatory Visit: Payer: Medicare Other | Admitting: Internal Medicine

## 2016-01-01 NOTE — Telephone Encounter (Signed)
left msg confirming 6/12 apts

## 2016-01-01 NOTE — Telephone Encounter (Signed)
Per staff message and POF I have scheduled appts. Advised scheduler of appts and first available given. JMW  

## 2016-01-03 ENCOUNTER — Ambulatory Visit (HOSPITAL_COMMUNITY)
Admission: RE | Admit: 2016-01-03 | Discharge: 2016-01-03 | Disposition: A | Discharge status: Home / Self Care | Payer: Medicare Other | Source: Ambulatory Visit | Attending: Internal Medicine | Admitting: Internal Medicine

## 2016-01-03 DIAGNOSIS — J90 Pleural effusion, not elsewhere classified: Secondary | ICD-10-CM | POA: Diagnosis not present

## 2016-01-03 DIAGNOSIS — J700 Acute pulmonary manifestations due to radiation: Secondary | ICD-10-CM | POA: Insufficient documentation

## 2016-01-03 DIAGNOSIS — C3491 Malignant neoplasm of unspecified part of right bronchus or lung: Secondary | ICD-10-CM

## 2016-01-03 DIAGNOSIS — J479 Bronchiectasis, uncomplicated: Secondary | ICD-10-CM | POA: Diagnosis not present

## 2016-01-03 DIAGNOSIS — Z5111 Encounter for antineoplastic chemotherapy: Secondary | ICD-10-CM | POA: Insufficient documentation

## 2016-01-03 MED ORDER — IOPAMIDOL (ISOVUE-300) INJECTION 61%
100.0000 mL | Freq: Once | INTRAVENOUS | Status: AC | PRN
  Administered 2016-01-03: 100 mL via INTRAVENOUS

## 2016-01-07 ENCOUNTER — Telehealth: Payer: Self-pay | Admitting: *Deleted

## 2016-01-07 ENCOUNTER — Telehealth: Payer: Self-pay | Admitting: Internal Medicine

## 2016-01-07 ENCOUNTER — Other Ambulatory Visit: Payer: Medicare Other

## 2016-01-07 ENCOUNTER — Ambulatory Visit: Payer: Medicare Other | Admitting: Internal Medicine

## 2016-01-07 ENCOUNTER — Ambulatory Visit: Payer: Medicare Other

## 2016-01-07 NOTE — Telephone Encounter (Signed)
Pt expired per pof, all apt cancelled per pof

## 2016-01-07 NOTE — Telephone Encounter (Signed)
Call from pt daughter who advised pt passed on 2022/10/21. Belenda Cruise has requested to speak with MD about scan results as this was going to be reviewed at todays appt. Message given to MD for review. Spoke with MD who advised he will see husband and daughter this afternoon and work them in to discuss pt's scan results. Daughter was very thankful and will be here around 1pm  No further concerns.

## 2016-01-08 ENCOUNTER — Other Ambulatory Visit: Payer: Medicare Other

## 2016-01-11 ENCOUNTER — Telehealth: Payer: Self-pay | Admitting: Internal Medicine

## 2016-01-11 NOTE — Telephone Encounter (Signed)
HIM informed via staff message from desk nurse patient died Jan 22, 2016. Status changed to deceased.

## 2016-01-15 ENCOUNTER — Ambulatory Visit: Payer: Medicare Other | Admitting: Internal Medicine

## 2016-01-15 ENCOUNTER — Other Ambulatory Visit: Payer: Medicare Other

## 2016-01-15 ENCOUNTER — Ambulatory Visit: Payer: Medicare Other

## 2016-01-22 ENCOUNTER — Ambulatory Visit: Payer: Medicare Other

## 2016-01-22 ENCOUNTER — Other Ambulatory Visit: Payer: Medicare Other

## 2016-01-26 DEATH — deceased

## 2016-05-21 IMAGING — NM NM MYOCAR MULTI W/SPECT W/WALL MOTION & EF
2 series · 12 of 12 positions shown · non-contrast
Comparison: none

[Series 1: rest · 8.28mm/px · 6 of 64 frames shown]
[frame 6/64]
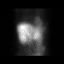
[frame 16/64]
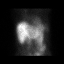
[frame 27/64]
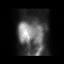
[frame 38/64]
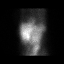
[frame 48/64]
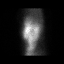
[frame 59/64]
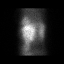

[Series 2: stress gated · 8.28mm/px · 6 of 64 frames shown]
[frame 6/64]
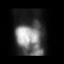
[frame 16/64]
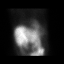
[frame 27/64]
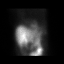
[frame 38/64]
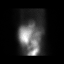
[frame 48/64]
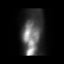
[frame 59/64]
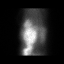

[12 of 12 positions shown; findings below may reference images not displayed]

Canned report from images found in remote index.

Refer to host system for actual result text.

## 2016-07-11 ENCOUNTER — Other Ambulatory Visit: Payer: Self-pay | Admitting: Nurse Practitioner
